# Patient Record
Sex: Male | Born: 1940
Health system: Southern US, Community
[De-identification: ages and names within clinical notes are randomized; demographics above are authoritative.]

## PROBLEM LIST (undated history)

## (undated) DIAGNOSIS — H9190 Unspecified hearing loss, unspecified ear: Secondary | ICD-10-CM

## (undated) DIAGNOSIS — T8859XA Other complications of anesthesia, initial encounter: Secondary | ICD-10-CM

## (undated) DIAGNOSIS — I10 Essential (primary) hypertension: Secondary | ICD-10-CM

## (undated) DIAGNOSIS — S143XXA Injury of brachial plexus, initial encounter: Secondary | ICD-10-CM

## (undated) DIAGNOSIS — I4891 Unspecified atrial fibrillation: Secondary | ICD-10-CM

## (undated) DIAGNOSIS — M75100 Unspecified rotator cuff tear or rupture of unspecified shoulder, not specified as traumatic: Secondary | ICD-10-CM

## (undated) DIAGNOSIS — G4730 Sleep apnea, unspecified: Secondary | ICD-10-CM

## (undated) DIAGNOSIS — C61 Malignant neoplasm of prostate: Secondary | ICD-10-CM

## (undated) HISTORY — DX: Essential (primary) hypertension: I10

## (undated) HISTORY — PX: KNEE SURGERY: SHX244

## (undated) HISTORY — PX: MOHS SURGERY: SUR867

## (undated) HISTORY — DX: Injury of brachial plexus, initial encounter: S14.3XXA

## (undated) HISTORY — DX: Unspecified rotator cuff tear or rupture of unspecified shoulder, not specified as traumatic: M75.100

## (undated) HISTORY — PX: TONSILLECTOMY: SUR1361

## (undated) HISTORY — DX: Unspecified atrial fibrillation: I48.91

## (undated) HISTORY — PX: OTHER SURGICAL HISTORY: SHX169

## (undated) HISTORY — PX: PROSTATECTOMY: SHX69

## (undated) HISTORY — DX: Malignant neoplasm of prostate: C61

---

## 1989-01-19 HISTORY — PX: OTHER SURGICAL HISTORY: SHX169

## 1993-04-21 DIAGNOSIS — S143XXA Injury of brachial plexus, initial encounter: Secondary | ICD-10-CM

## 1993-04-21 HISTORY — DX: Injury of brachial plexus, initial encounter: S14.3XXA

## 1997-04-21 DIAGNOSIS — C61 Malignant neoplasm of prostate: Secondary | ICD-10-CM

## 1997-04-21 HISTORY — DX: Malignant neoplasm of prostate: C61

## 1998-04-21 DIAGNOSIS — M75100 Unspecified rotator cuff tear or rupture of unspecified shoulder, not specified as traumatic: Secondary | ICD-10-CM

## 1998-04-21 HISTORY — DX: Unspecified rotator cuff tear or rupture of unspecified shoulder, not specified as traumatic: M75.100

## 2000-10-12 ENCOUNTER — Encounter: Admission: RE | Admit: 2000-10-12 | Discharge: 2000-10-12 | Payer: Self-pay | Admitting: Sports Medicine

## 2002-01-21 ENCOUNTER — Encounter: Admission: RE | Admit: 2002-01-21 | Discharge: 2002-01-21 | Payer: Self-pay | Admitting: Sports Medicine

## 2004-04-21 HISTORY — PX: OTHER SURGICAL HISTORY: SHX169

## 2005-03-06 ENCOUNTER — Ambulatory Visit: Payer: Self-pay | Admitting: Sports Medicine

## 2005-03-18 ENCOUNTER — Ambulatory Visit: Payer: Self-pay | Admitting: Family Medicine

## 2005-04-01 ENCOUNTER — Ambulatory Visit (HOSPITAL_COMMUNITY): Admission: RE | Admit: 2005-04-01 | Discharge: 2005-04-01 | Payer: Self-pay | Admitting: Sports Medicine

## 2005-04-01 ENCOUNTER — Ambulatory Visit: Payer: Self-pay | Admitting: Sports Medicine

## 2006-06-18 DIAGNOSIS — M1710 Unilateral primary osteoarthritis, unspecified knee: Secondary | ICD-10-CM | POA: Insufficient documentation

## 2006-06-18 DIAGNOSIS — C4490 Unspecified malignant neoplasm of skin, unspecified: Secondary | ICD-10-CM

## 2006-06-18 DIAGNOSIS — C61 Malignant neoplasm of prostate: Secondary | ICD-10-CM | POA: Insufficient documentation

## 2006-06-18 DIAGNOSIS — M479 Spondylosis, unspecified: Secondary | ICD-10-CM | POA: Insufficient documentation

## 2007-04-22 DIAGNOSIS — I499 Cardiac arrhythmia, unspecified: Secondary | ICD-10-CM

## 2007-04-22 HISTORY — DX: Cardiac arrhythmia, unspecified: I49.9

## 2007-04-29 ENCOUNTER — Ambulatory Visit: Payer: Self-pay | Admitting: Sports Medicine

## 2007-04-29 ENCOUNTER — Encounter: Admission: RE | Admit: 2007-04-29 | Discharge: 2007-04-29 | Payer: Self-pay | Admitting: Sports Medicine

## 2007-04-29 DIAGNOSIS — I10 Essential (primary) hypertension: Secondary | ICD-10-CM

## 2007-04-29 DIAGNOSIS — R05 Cough: Secondary | ICD-10-CM

## 2007-04-29 DIAGNOSIS — J309 Allergic rhinitis, unspecified: Secondary | ICD-10-CM | POA: Insufficient documentation

## 2007-04-29 DIAGNOSIS — R059 Cough, unspecified: Secondary | ICD-10-CM | POA: Insufficient documentation

## 2007-04-29 LAB — CONVERTED CEMR LAB
ALT: 20 units/L (ref 0–53)
AST: 20 units/L (ref 0–37)
Albumin: 4.2 g/dL (ref 3.5–5.2)
Alkaline Phosphatase: 67 units/L (ref 39–117)
BUN: 14 mg/dL (ref 6–23)
HDL: 75 mg/dL (ref >39)
Hemoglobin: 14.6 g/dL (ref 13.0–17.0)
LDL Cholesterol: 98 mg/dL (ref 0–99)
MCHC: 34.7 g/dL (ref 30.0–36.0)
PSA: 0.01 ng/mL — ABNORMAL LOW (ref 0.10–4.00)
Platelets: 201 10*3/uL (ref 150–400)
Potassium: 4.4 meq/L (ref 3.5–5.3)
RDW: 14.1 % (ref 11.5–15.5)
Sodium: 141 meq/L (ref 135–145)

## 2007-06-10 ENCOUNTER — Encounter: Payer: Self-pay | Admitting: *Deleted

## 2007-06-17 ENCOUNTER — Ambulatory Visit: Payer: Self-pay | Admitting: Sports Medicine

## 2007-06-17 DIAGNOSIS — D485 Neoplasm of uncertain behavior of skin: Secondary | ICD-10-CM

## 2008-01-26 ENCOUNTER — Ambulatory Visit: Payer: Self-pay | Admitting: Family Medicine

## 2008-01-28 ENCOUNTER — Encounter: Payer: Self-pay | Admitting: *Deleted

## 2008-08-10 ENCOUNTER — Ambulatory Visit: Payer: Self-pay | Admitting: Cardiology

## 2008-08-10 ENCOUNTER — Encounter: Payer: Self-pay | Admitting: Cardiology

## 2008-08-25 ENCOUNTER — Ambulatory Visit: Payer: Self-pay

## 2008-09-12 ENCOUNTER — Ambulatory Visit: Payer: Self-pay | Admitting: Internal Medicine

## 2008-09-14 ENCOUNTER — Telehealth: Payer: Self-pay | Admitting: Cardiology

## 2008-09-27 ENCOUNTER — Telehealth (INDEPENDENT_AMBULATORY_CARE_PROVIDER_SITE_OTHER): Payer: Self-pay | Admitting: *Deleted

## 2008-10-10 ENCOUNTER — Telehealth (INDEPENDENT_AMBULATORY_CARE_PROVIDER_SITE_OTHER): Payer: Self-pay | Admitting: *Deleted

## 2008-10-11 ENCOUNTER — Encounter: Payer: Self-pay | Admitting: Cardiology

## 2008-10-11 ENCOUNTER — Ambulatory Visit: Payer: Self-pay

## 2008-10-12 ENCOUNTER — Telehealth: Payer: Self-pay | Admitting: Cardiology

## 2010-05-23 ENCOUNTER — Encounter: Payer: Medicare Other | Admitting: Physician Assistant

## 2010-05-31 ENCOUNTER — Other Ambulatory Visit: Payer: Self-pay | Admitting: Orthopedic Surgery

## 2010-05-31 ENCOUNTER — Ambulatory Visit (HOSPITAL_COMMUNITY): Payer: Medicare Other | Attending: Cardiovascular Disease

## 2010-05-31 ENCOUNTER — Encounter: Payer: Self-pay | Admitting: Physician Assistant

## 2010-05-31 ENCOUNTER — Encounter (INDEPENDENT_AMBULATORY_CARE_PROVIDER_SITE_OTHER): Payer: Medicare Other | Admitting: Physician Assistant

## 2010-05-31 ENCOUNTER — Encounter (HOSPITAL_COMMUNITY): Payer: Medicare Other

## 2010-05-31 ENCOUNTER — Ambulatory Visit (HOSPITAL_COMMUNITY)
Admission: RE | Admit: 2010-05-31 | Discharge: 2010-05-31 | Disposition: A | Discharge status: Home / Self Care | Payer: Medicare Other | Source: Ambulatory Visit | Attending: Orthopedic Surgery | Admitting: Orthopedic Surgery

## 2010-05-31 ENCOUNTER — Other Ambulatory Visit (HOSPITAL_COMMUNITY): Payer: Self-pay | Admitting: Orthopedic Surgery

## 2010-05-31 DIAGNOSIS — R059 Cough, unspecified: Secondary | ICD-10-CM | POA: Insufficient documentation

## 2010-05-31 DIAGNOSIS — Z01818 Encounter for other preprocedural examination: Secondary | ICD-10-CM | POA: Insufficient documentation

## 2010-05-31 DIAGNOSIS — I10 Essential (primary) hypertension: Secondary | ICD-10-CM | POA: Insufficient documentation

## 2010-05-31 DIAGNOSIS — I080 Rheumatic disorders of both mitral and aortic valves: Secondary | ICD-10-CM | POA: Insufficient documentation

## 2010-05-31 DIAGNOSIS — I4891 Unspecified atrial fibrillation: Secondary | ICD-10-CM | POA: Insufficient documentation

## 2010-05-31 DIAGNOSIS — R05 Cough: Secondary | ICD-10-CM | POA: Insufficient documentation

## 2010-05-31 DIAGNOSIS — Z01810 Encounter for preprocedural cardiovascular examination: Secondary | ICD-10-CM

## 2010-05-31 DIAGNOSIS — IMO0002 Reserved for concepts with insufficient information to code with codable children: Secondary | ICD-10-CM | POA: Insufficient documentation

## 2010-05-31 DIAGNOSIS — I079 Rheumatic tricuspid valve disease, unspecified: Secondary | ICD-10-CM | POA: Insufficient documentation

## 2010-05-31 DIAGNOSIS — Z87891 Personal history of nicotine dependence: Secondary | ICD-10-CM | POA: Insufficient documentation

## 2010-05-31 DIAGNOSIS — M1710 Unilateral primary osteoarthritis, unspecified knee: Secondary | ICD-10-CM | POA: Insufficient documentation

## 2010-05-31 LAB — URINALYSIS, ROUTINE W REFLEX MICROSCOPIC
Nitrite: NEGATIVE
Specific Gravity, Urine: 1.019 (ref 1.005–1.030)
Urobilinogen, UA: 0.2 mg/dL (ref 0.0–1.0)

## 2010-05-31 LAB — BASIC METABOLIC PANEL
BUN: 18 mg/dL (ref 6–23)
Creatinine, Ser: 0.99 mg/dL (ref 0.4–1.5)
GFR calc non Af Amer: >60 mL/min (ref >60)

## 2010-05-31 LAB — PROTIME-INR
INR: 1.01 (ref 0.00–1.49)
Prothrombin Time: 13.5 seconds (ref 11.6–15.2)

## 2010-05-31 LAB — CBC
HCT: 44.9 % (ref 39.0–52.0)
Platelets: 207 10*3/uL (ref 150–400)
RDW: 13.6 % (ref 11.5–15.5)
WBC: 8.6 10*3/uL (ref 4.0–10.5)

## 2010-05-31 LAB — DIFFERENTIAL
Basophils Absolute: 0.1 10*3/uL (ref 0.0–0.1)
Eosinophils Absolute: 0.2 10*3/uL (ref 0.0–0.7)
Eosinophils Relative: 2 % (ref 0–5)
Lymphocytes Relative: 18 % (ref 12–46)

## 2010-06-03 ENCOUNTER — Ambulatory Visit (HOSPITAL_COMMUNITY): Admission: RE | Admit: 2010-06-03 | Payer: Medicare Other | Source: Ambulatory Visit

## 2010-06-04 ENCOUNTER — Telehealth (INDEPENDENT_AMBULATORY_CARE_PROVIDER_SITE_OTHER): Payer: Self-pay | Admitting: *Deleted

## 2010-06-04 ENCOUNTER — Telehealth: Payer: Self-pay | Admitting: Cardiovascular Disease

## 2010-06-06 NOTE — Assessment & Plan Note (Addendum)
Summary: Surgical Clearance   Visit Type:  surg clearance Primary Provider:  Lupe Carney  CC:  pt denies any cardiac complaints today..pt was @ [facility] today for pre-admisssion for surgery and Meriam Sprague from admissions called over to office and today and states EKG done shows AFIB and possible new onset.....  History of Present Illness: Glenn [name] is a 70 year old male Dr. Juanda Chance in 2010 for surgery.  He underwent a Myoview study at that time.  He walked for 10 minutes and his images demonstrated an ejection fraction of 63%, apical thinning but no scar or ischemia.  His medications for blood pressure were adjusted.  He is in need of surgical clearance and is referred today for such.  He was at [facility] today and apparently had an electrocardiogram that demonstrated possible new onset atrial fibrillation.  He needs a left total knee replacement which has been scheduled for 06/10/10 with Dr. Charlann Boxer.  Overall, he feels well.  He denies chest pain.  He denies syncope.  He denies orthopnea or PND.  He does occasionally have some mild ankle edema.  He exercises often and plays tennis 3 times a week.  He may occasionally notes some shortness of breath with over exertion but nothing out of the ordinary.  He has not felt any palpitations.  Current Medications (verified): 1)  Losartan Potassium 100 Mg Tabs (Losartan Potassium) .Marland Kitchen.. 1 Tab Once Daily 2)  Multivitamins  Tabs (Multiple Vitamin) .... Take 1 Tablet By Mouth Once A Day 3)  Glucosamine-Chondroitin 500-400 Mg Caps (Glucosamine-Chondroitin) .Marland Kitchen.. 1 Cap Once Daily 4)  Aspirin 81 Mg Tbec (Aspirin) .... Take One Tablet By Mouth Daily 5)  Ibuprofen 400 Mg Tabs (Ibuprofen) .Marland Kitchen.. 1 Tab Once Daily As Needed 6)  Amlodipine Besylate 5 Mg Tabs (Amlodipine Besylate) .... Take One Tablet By Mouth Daily  Allergies (verified): No Known Drug Allergies  Past History:  Past Medical History: brachial plexus injury lt arm 1995, torn rotator cuff rt arm  2000 Hypertension x 6 yrs Prostate Cancer  Past Surgical History: Int fixation left forearm - 01/19/1989, radical prostatectomy - 08/19/1993 - Dr Fr[facility]isca [facility] at [facility] removal of basal cell - 11/19/1996 ETT in 2006 with high fitness but BP of 255!  Family History: Reviewed history from 04/29/2007 and no changes required. brother 70 with obesity,  brother 27 with parkinson`s dz and prostate Ca,  father died 79 MI,  mother died at 13  Social History: works as Veterinary surgeon;  active Media planner played BB at [employer] graduated 1964 after 2 final fours married to [place]; 2 male and 1 male children;  multiple grandkids;  non smoker - quit 25 years ago social ETOH  Review of Systems       As per  the HPI.  All other systems reviewed and negative.   Vital Signs:  Patient profile:   70 year old male Height:      72 inches Weight:      209 pounds BMI:     28.45 Pulse rate:   62 / minute Pulse rhythm:   irregular BP sitting:   120 / 78  (left arm) Cuff size:   large  Vitals Entered By: Danielle Rankin, CMA (May 31, 2010 11:37 AM)  Physical Exam  General:  Well nourished, well developed, in no acute distress HEENT: normal Neck: no JVD Cardiac:  normal S1, S2; irreg irreg Lungs:  clear to auscultation bilaterally, no wheezing, rhonchi or rales Abd: soft, nontender, no hepatomegaly Ext: no edema Vascular: no carotid  bruits Skin: warm and dry Neuro:  CNs 2-12 intact, no focal abnormalities noted    EKG  Procedure date:  05/31/2010  Findings:      atrial fibrillation Heart rate  69 Normal axis Poor R-wave progression No ischemic changes  Impression & Recommendations:  Problem # 1:  ATRIAL FIBRILLATION (ICD-427.31) His rate is overall well controlled.  He actually has a slow response at times.  He denies any symptoms of near syncope, fatigue or syncope.  His stroke risk is probably significant enough to warrant long-term anticoagulation.  His CHADS2 score is one.  However, his  CHADS-VASc score is 2.  I discussed his case today with Dr. Excell Seltzer.  Dr. Excell Seltzer also saw the patient.  With his upcoming surgery, we felt that initiating long-term anticoagulation could be made postoperatively.  He will be placed on Coumadin in the short term after his knee surgery.  We will bring him back in close followup 2-3 weeks after his surgery to decide whether or not to continue Coumadin long-term.  I will set him up to see one of our electrophysiologists.  Before clearing him for surgery, we will set him up for an echocardiogram.  As long as this does not demonstrate any significant abnormalities, we will be able to clear him for surgery.  There is currently no need to start any rate controlling medications.  His rate should be monitored during his surgery to ensure that he does not have an uncontrolled response.  Our service will be available in the perioperative period as necessary.  Orders: Echocardiogram (Echo)  Problem # 2:  HYPERTENSION, BENIGN (ICD-401.1) Controlled.  Problem # 3:  PREOPERATIVE EXAMINATION (ICD-V72.84) As noted above, he will have an echocardiogram due to his new onset atrial fibrillation.  As long as this does not demonstrate any significant abnormalities, we should be able to clear him for his surgery.  Patient Instructions: 1)  Your physician recommends that you schedule a follow-up appointment in: 2-3 WEEKS 2)  Your physician has requested that you have an echocardiogram.  Echocardiography is a painless test that uses sound waves to create images of your heart. It provides your doctor with information about the size and shape of your heart and how well your heart's chambers and valves are working.  This procedure takes approximately one hour. There are no restrictions for this procedure.

## 2010-06-10 ENCOUNTER — Inpatient Hospital Stay (HOSPITAL_COMMUNITY)
Admission: RE | Admit: 2010-06-10 | Discharge: 2010-06-13 | DRG: 470 | Disposition: A | Discharge status: Home Health | Payer: Medicare Other | Attending: Orthopedic Surgery | Admitting: Orthopedic Surgery

## 2010-06-10 DIAGNOSIS — I10 Essential (primary) hypertension: Secondary | ICD-10-CM | POA: Diagnosis present

## 2010-06-10 DIAGNOSIS — M1710 Unilateral primary osteoarthritis, unspecified knee: Principal | ICD-10-CM | POA: Diagnosis present

## 2010-06-10 DIAGNOSIS — I4891 Unspecified atrial fibrillation: Secondary | ICD-10-CM | POA: Diagnosis present

## 2010-06-10 LAB — TYPE AND SCREEN: Antibody Screen: NEGATIVE

## 2010-06-11 LAB — CBC
HCT: 38.9 % — ABNORMAL LOW (ref 39.0–52.0)
MCV: 92.2 fL (ref 78.0–100.0)
Platelets: 191 10*3/uL (ref 150–400)
RBC: 4.22 MIL/uL (ref 4.22–5.81)
WBC: 12.8 10*3/uL — ABNORMAL HIGH (ref 4.0–10.5)

## 2010-06-11 LAB — BASIC METABOLIC PANEL
CO2: 26 mEq/L (ref 19–32)
Chloride: 108 mEq/L (ref 96–112)
Creatinine, Ser: 0.87 mg/dL (ref 0.4–1.5)
Potassium: 4.1 mEq/L (ref 3.5–5.1)

## 2010-06-12 ENCOUNTER — Encounter: Payer: Self-pay | Admitting: Cardiology

## 2010-06-12 LAB — CBC
HCT: 36.3 % — ABNORMAL LOW (ref 39.0–52.0)
Hemoglobin: 12.2 g/dL — ABNORMAL LOW (ref 13.0–17.0)
MCV: 92.4 fL (ref 78.0–100.0)
RDW: 13.6 % (ref 11.5–15.5)
WBC: 14.1 10*3/uL — ABNORMAL HIGH (ref 4.0–10.5)

## 2010-06-12 LAB — BASIC METABOLIC PANEL
BUN: 11 mg/dL (ref 6–23)
CO2: 23 mEq/L (ref 19–32)
Chloride: 109 mEq/L (ref 96–112)
Creatinine, Ser: 0.81 mg/dL (ref 0.4–1.5)
Glucose, Bld: 124 mg/dL — ABNORMAL HIGH (ref 70–99)

## 2010-06-12 NOTE — Progress Notes (Signed)
Summary: test results  Phone Note Call from Patient Call back at [phone]   Caller: Patient Reason for Call: Talk to Nurse Summary of Call: pt calling re echo results Initial call taken by: Roe Coombs,  June 04, 2010 12:48 PM  Follow-up for Phone Call        Pt. given echo results and Dr. Earmon Phoenix recommendations. Pt made aware he has been cleared for surgery and paperwork will be faxed to [facility] today. Follow-up by: Dossie Arbour, RN, BSN,  June 04, 2010 1:42 PM

## 2010-06-12 NOTE — Progress Notes (Signed)
Summary: Faxed records to [place] at [facility].  Faxed records to [place] at [facility]. LOV & ECHO Z:[phone] P:[phone] Marylou Mccoy  June 04, 2010 8:37 AM

## 2010-06-14 NOTE — Op Note (Signed)
NAME:  Glenn [name], Glenn [name]                     ACCOUNT NO.:  [id number]  MEDICAL RECORD NO.:  [id number]           PATIENT TYPE:  I  LOCATION:  1621                         FACILITY:  [facility]  PHYSICIAN:  Madlyn Frankel. Charlann Boxer, M.D.  DATE OF BIRTH:  May 02, 1940  DATE OF PROCEDURE:  06/10/2010 DATE OF DISCHARGE:                              OPERATIVE REPORT   PREOPERATIVE DIAGNOSIS:  Left knee osteoarthritis with flexion and varus deformity.  POSTOPERATIVE DIAGNOSIS:  Left knee osteoarthritis with flexion and varus deformity.  PROCEDURE:  Left total knee replacement utilizing DePuy component size 5 femur, 4  tibia, 41 patellar button, and 10 mm insert.  SURGEON:  Madlyn Frankel. Charlann Boxer, M.D.  ASSISTANT:  Nelia Shi. Webb Silversmith, RN.  ANESTHESIA:  Spinal.  SPECIMENS:  None.  COMPLICATIONS:  None.  DRAINS:  One Hemovac.  TOURNIQUET TIME:  46 minutes at 250 mmHg.  ESTIMATED BLOOD LOSS:  50 mL.  INDICATIONS FOR PROCEDURE:  Mr. Jaggers is a  70 year old male with advanced left knee osteoarthritis with progressive loss of function and pain that limiting his ability to continue doing the things he wish to do.  He does have moderate advanced right knee osteoarthritis.  After reviewing with him risks and benefits, the pros and cons of the procedure, the risks of infection, DVT, component failure, need for revision of surgery as well as the extensive need for therapy, postop work on its extension.  Consent was obtained for the benefit of pain relief.  PROCEDURE IN DETAIL:  The patient was brought to operative theater. Once adequate anesthesia, preoperative antibiotics, Ancef 2 g administered, patient was positioned supine with left leg in the Mayo leg holder.  A time-out was performed, identifying the patient, planned procedure, and the extremity.  Leg was exsanguinated, tourniquet elevated to 250 mmHg.  A midline incision was made followed by median arthrotomy.  Following initial exposure, synovectomy,  partial medial and lateral meniscectomies, attention was directed to patella precut measurement, it was 27 mm.  I resected down to about 15 mm and used the 41 patellar button to restore height.  Leg holes were drilled and a metal shim placed.  The metal shim was placed to protect the patella from retractors and saw blades.  At this point, more concentrated proximal and medial peel was carried out due to his varus deformity.  Attention was now directed to the femur.  Femoral canal was opened with a drill, irrigated to try to prevent fat emboli.  An intramedullary rod was passed at 3 degrees of valgus.  I resected 11 mm of bone of the distal femur based of his preoperative flexion contracture.  Following this resection the tibia subluxated anteriorly and using extramedullary guide I resected 10 mm bone off the proximal lateral tibia.  He was noted to have significant wear of the medial tibia.  At this point confirmed the extension gap was going to be stable with a 10-mm block and it was tight posteriorly.  I also confirmed the cut was perpendicular in the coronal plane for setting rotation of the femoral cut.  I then sized  the femur to be a size 5.  The size 5 rotation block was then pinned into position with a C clamp placed at the proximal tibial cut and then this was pinned into place for anterior reference.  The 4- in-1 cutting block was then placed.  Anterior, posterior, and chamfer cuts were then made without difficulty nor notching.  Final box cut made at the lateral aspect of the distal femur.  Tibia was then subluxated anteriorly.  I used the Harding rongeur and removed the osteophytes medially.  This reduced the cut surface of the proximal tibia back to an anatomic size.  I also removed osteophytes off the distal, medial, and lateral aspect of the femur as well as posterior osteophytes.  A size four tibial tray fit very nice on the cut surface of the tibia, it was pinned  into position, drilled, keel punched.  A trial reduction was now carried out with 5 femur, 4 tibia, and a 10-mm insert.  The knee came to full extension with stable medial and lateral collateral ligaments with little bit of pressure applied.  The patella tracked through the trochlea without application of pressure.  At this point, the trial components removed, I drilled holes into the sclerotic proximal medial tibia.  Cement was mixed.  The knee was injected with 0.25% Marcaine with epinephrine and 1 mL of Toradol and the knee irrigated with normal saline solution pulse lavage.  The final components were then cemented into position with the knee brought to extension with a 10-mm insert and extruded cement was removed.  Once the cement had fully cured, the knee was flexed up, the trial liner removed, excessive cement was removed throughout the knee.  The final 10-mm insert was then placed into the knee.  The tourniquet has been let down after 46 minutes without significant hemostasis required.  At this point, a medium Hemovac drain was placed deep.  The extensor mechanism was then reapproximated over top of this with #1 Vicryl with the knee in flexion.  The remaining wound was closed with 2-0 Vicryl and a running 4-0 Monocryl.  The knee was cleaned, dried, and dressed sterilely with Dermabond and Aquacel dressing.  The drain site dressed separately.  The knee wrapped in Ace.  He was then brought to recovery room in stable condition, tolerated the procedure well.     Madlyn Frankel Charlann Boxer, M.D.    MDO/MEDQ  D:  06/10/2010  T:  06/10/2010  Job:  [id number]  Electronically Signed by Durene Romans M.D. on 06/14/2010 07:04:27 AM

## 2010-06-18 NOTE — Discharge Summary (Signed)
  NAME:  Glenn [name], Glenn [name]                     ACCOUNT NO.:  [id number]  MEDICAL RECORD NO.:  [id number]           PATIENT TYPE:  I  LOCATION:  1621                         FACILITY:  [facility]  PHYSICIAN:  Russell L. Webb Silversmith, RN   DATE OF BIRTH:  December 16, 1940  DATE OF ADMISSION:  06/10/2010 DATE OF DISCHARGE:                              DISCHARGE SUMMARY   ADMITTING DIAGNOSIS:  Bilateral knee osteoarthritis, left greater than right.  BRIEF HISTORY:  This is a 70 year old male who is a friend of Dr. Nilsa Nutting family, was seen in consultation for knee pain.  He failed conservative treatment and decided to proceed with left knee arthroplasty first.  HOSPITAL COURSE:  The patient was admitted to Same Day Surgery on February 20.  He was taken to the operating theater and underwent left knee arthroplasty without any difficulty.  He was taken to PACU for recovery and brought to [facility] for further recovery and rehabilitation. Since that time, he has advanced his diet to regular.  He has been on physical therapy and done well with that.  His labs are stable.  His vital signs are stable.  He is afebrile.  He had some pain issues yesterday and decided to stay 1 more day.  He will be discharged home today with home health physical therapy.  DISCHARGE CONDITION:  Good.  DISCHARGE DIAGNOSES: 1. Bilateral knee osteoarthritis. 2. Hypertension. 3. Prostate cancer in 1995 with prostatectomy. 4. Shortness of breath, on exertion.  DISCHARGE INSTRUCTIONS:  Discharge instructions were given.  He knows he has to take the Aquacel dressing off in 8 days.  He can keep the wound dry otherwise, but he may shower.  He is to follow up with Dr. Charlann Boxer in 2 weeks.  DISCHARGE MEDICATIONS: 1. Acetaminophen 500 mg extra strength, 1000 every 8 hours. 2. Colace 100 mg as needed. 3. Ferrous sulfate 325 mg 3 times a day. 4. Robaxin 500 mg twice every 6 hours. 5. Oxycodone 5 mg to 15 mg every 4 to 6 hours as needed. 6. MiraLax 17  g a day as needed. 7. Xarelto 10 mg a day for 10 days. 8. Amlodipine 5 mg every morning. 9. Glucosamine daily. 10.Ibuprofen as needed. 11.Losartan 100 mg every morning. 12.Multivitamins daily.     Russell L. Webb Silversmith, RN     RLW/MEDQ  D:  06/13/2010  T:  06/13/2010  Job:  [id number]  Electronically Signed by Durene Romans M.D. on 06/18/2010 07:08:27 AM

## 2010-06-19 ENCOUNTER — Encounter: Payer: Self-pay | Admitting: Internal Medicine

## 2010-06-19 ENCOUNTER — Ambulatory Visit (INDEPENDENT_AMBULATORY_CARE_PROVIDER_SITE_OTHER): Payer: Medicare Other | Admitting: Internal Medicine

## 2010-06-19 DIAGNOSIS — I4891 Unspecified atrial fibrillation: Secondary | ICD-10-CM

## 2010-06-19 DIAGNOSIS — I10 Essential (primary) hypertension: Secondary | ICD-10-CM

## 2010-06-27 NOTE — Assessment & Plan Note (Signed)
Summary: 2-3 WK   Visit Type:  Follow-up Primary Provider:  Lupe Carney   History of Present Illness: Glenn [name] is referred today by Dr. Excell Seltzer for evaluation and treatment of atrial fibrillation. He has a h/o chronic arthritis and while he was undergoing workup for knee replacement was found to be in atrial fib. He denies palpitations. The patient states that he did not know he was out of rhythm but his wife thinks in retrospect he has had more dyspnea. He had no problems with his surgery (Dr. Charlann Boxer) and is about to start rehab. He denies c/p, syncope or peripheral edema. He had seen Dr. Juanda Chance over a year ago and had no ischemia on stress testing. No symptoms of hyperthyroidism.  Current Medications (verified): 1)  Losartan Potassium 100 Mg Tabs (Losartan Potassium) .Marland Kitchen.. 1 Tab Once Daily 2)  Amlodipine Besylate 5 Mg Tabs (Amlodipine Besylate) .... Take One Tablet By Mouth Daily 3)  Methocarbamol 500 Mg Tabs (Methocarbamol) .... Q 6 Hours As Needed 4)  Tylenol Extra Strength 500 Mg Tabs (Acetaminophen) .... As Directed 5)  Oxycodone Hcl 5 Mg Tabs (Oxycodone Hcl) .Marland Kitchen.. 1 Q 4-6 Hours As Needed 6)  Ferrous Sulfate 325 (65 Fe) Mg Tabs (Ferrous Sulfate) .Marland Kitchen.. 1 Three Times A Day  After Meals 7)  Senna-Plus 8.6-50 Mg Tabs (Sennosides-Docusate Sodium) .Marland Kitchen.. 1 Two Times A Day 8)  Colace 100 Mg Caps (Docusate Sodium) .Marland Kitchen.. 1 Two Times A Day 9)  Xarelto 10 Mg Tabs (Rivaroxaban) .Marland Kitchen.. 1 Once Daily  Allergies: No Known Drug Allergies  Past History:  Past Medical History: Last updated: 05/31/2010 brachial plexus injury lt arm 1995, torn rotator cuff rt arm 2000 Hypertension x 6 yrs Prostate Cancer  Past Surgical History: Last updated: 05/31/2010 Int fixation left forearm - 01/19/1989, radical prostatectomy - 08/19/1993 - Dr Fransisca Connors at [facility] removal of basal cell - 11/19/1996 ETT in 2006 with high fitness but BP of 255!  Family History: Last updated: 05/31/2010 brother 39 with obesity,  brother 23  with parkinson`s dz and prostate Ca,  father died 11 MI,  mother died at 93  Social History: Last updated: 05/31/2010 works as Veterinary surgeon;  active Media planner played BB at [employer] graduated 1964 after 2 final fours married to [place]; 2 male and 1 male children;  multiple grandkids;  non smoker - quit 25 years ago social ETOH  Review of Systems       All systems reviewed and negative except for left leg soreness.  Vital Signs:  Patient profile:   70 year old male Height:      72 inches Weight:      207 pounds BMI:     28.18 Pulse rate:   64 / minute BP sitting:   122 / 80  (left arm)  Vitals Entered By: Laurance Flatten CMA (June 19, 2010 3:31 PM)  Physical Exam  General:  Well nourished, well developed, in no acute distress HEENT: normal Neck: no JVD Cardiac:  normal S1, S2; irreg irreg Lungs:  clear to auscultation bilaterally, no wheezing, rhonchi or rales Abd: soft, nontender, no hepatomegaly Ext: no edema. Left leg minimally swollen. Vascular: no carotid  bruits Skin: warm and dry Neuro:  CNs 2-12 intact, no focal abnormalities noted    Impression & Recommendations:  Problem # 1:  ATRIAL FIBRILLATION (ICD-427.31) The patient is minimally symptomatic if at all. I discussed the issues of thromboembolic prevention and the CHADS scoring system with the patient (he is CHADS 1) as well as  prevention of symptoms and rate vs rhythm control. I plan to ask him to undergo exercise treadmill testing once he recovers from his knee replacement surgery to better assess his rate response. A decision about rate vs rhythm control will be discussed at that time. The following medications were removed from the medication list:    Aspirin 81 Mg Tbec (Aspirin) .Marland Kitchen... Take one tablet by mouth daily  Problem # 2:  HYPERTENSION, BENIGN (ICD-401.1) His blood pressure is well controlled.  Will follow. A low sodium diet is requested. The following medications were removed from the medication  list:    Aspirin 81 Mg Tbec (Aspirin) .Marland Kitchen... Take one tablet by mouth daily His updated medication list for this problem includes:    Losartan Potassium 100 Mg Tabs (Losartan potassium) .Marland Kitchen... 1 tab once daily    Amlodipine Besylate 5 Mg Tabs (Amlodipine besylate) .Marland Kitchen... Take one tablet by mouth daily  Patient Instructions: 1)  Your physician recommends that you schedule a follow-up appointment in: 2 MONTHS WITH DR Ladona Ridgel 2)  Your physician recommends that you continue on your current medications as directed. Please refer to the Current Medication list given to you today.

## 2010-07-09 NOTE — Letter (Signed)
Summary: [facility] Surgical Clearance   [facility] Surgical Clearance   Imported By: Roderic Ovens 07/01/2010 10:28:00  _____________________________________________________________________  External Attachment:    Type:   Image     Comment:   External Document

## 2010-07-12 NOTE — H&P (Signed)
  NAME:  [name], Glenn                     ACCOUNT NO.:  [id number]  MEDICAL RECORD NO.:  [id number]           PATIENT TYPE:  O  LOCATION:  PADM                         FACILITY:  [facility]  PHYSICIAN:  Madlyn Frankel. Charlann Boxer, M.D.  DATE OF BIRTH:  1940-08-06  DATE OF ADMISSION:  05/31/2010 DATE OF DISCHARGE:                             HISTORY & PHYSICAL   ADMISSION DIAGNOSIS:  Bilateral knee osteoarthritis, left greater than right.  HISTORY:  This is a 70 year old male who is a friend of Dr. Nilsa Nutting family and he is seen in consultation for his knee pain.  He has failed conservative treatments, decided to proceed with the arthroplasty, left first.  PAST MEDICAL HISTORY:  Significant for hypertension.  He did have prostate cancer in 1995 with prostatectomy.  He has occasional cough and shortness of breath on exertion, otherwise healthy.  PAST SURGICAL HISTORY:  Surgical history is only prostatectomy.  CURRENT MEDICATIONS:  Losartan, amlodipine, baby aspirin a day,vitamins, glucosamine chondroitin, ibuprofen.  ALLERGIES:  He does not describe medicine allergies.  He does not know the doses of his medicines.  SOCIAL HISTORY:  The patient is married.  He is real-estate agent.  He has past history of tobacco use.  He drinks alcohol beverages socially. No history of substance abuse.  He has 3 children.  His disposition plan is for home.  FAMILY HISTORY:  His father died at 93 of heart attack.  His mother died at 53 of old age.  He has siblings.  REVIEW OF SYSTEMS:  Notable for those difficulties described in history of present illness and past medical history.  His 14-point review of system sheet is otherwise unremarkable.  PHYSICAL EXAMINATION:  VITAL SIGNS:  The patient is 6 feet tall, 190 pounds, blood pressure is 120/80, his respirations are 20, his pulse is 68. GENERAL:  General health is good. HEENT:  Shows him to be normocephalic. NECK:  Unremarkable. CHEST:  Clear to auscultation  bilaterally. HEART:  S1, S2.  No murmurs, rubs or gallops. ABDOMEN:  Soft, nondistended. GI/GU:  Otherwise unremarkable except for the history of prostate in 1995. EXTREMITIES:  Extremity exam shows osteoarthritis of the knees. DERMATOLOGICAL:  Intact. NEUROLOGICAL:  Intact.  Labs, EKG and chest x-Llewellyn are pending through [facility].  IMPRESSION:  Bilateral knee osteoarthritis, left greater than right.  PLAN:  He will be admitted on the June 10, 2010, for a left total knee arthroplasty with Dr. Charlann Boxer.  His discharge medications including Xarelto, MiraLax, Colace, Robaxin and iron were given to him today.  His pain medicines will be given to him at discharge.     Russell L. Webb Silversmith, RN   ______________________________ Madlyn Frankel Charlann Boxer, M.D.    RLW/MEDQ  D:  05/31/2010  T:  05/31/2010  Job:  [id number]  Electronically Signed by Lauree Chandler NP-C on 06/03/2010 02:55:02 PM Electronically Signed by Durene Romans M.D. on 06/10/2010 10:49:18 AM

## 2010-07-19 ENCOUNTER — Telehealth: Payer: Self-pay | Admitting: Cardiovascular Disease

## 2010-07-19 MED ORDER — LOSARTAN POTASSIUM 100 MG PO TABS: 100.0000 mg | ORAL_TABLET | Freq: Every day | ORAL | Status: DC

## 2010-07-19 MED ORDER — AMLODIPINE BESYLATE 5 MG PO TABS: 5.0000 mg | ORAL_TABLET | Freq: Every day | ORAL | Status: DC

## 2010-07-19 NOTE — Telephone Encounter (Signed)
Pt needs his blood pressure meds to be refill. [facility] # [phone]

## 2010-08-09 ENCOUNTER — Encounter: Payer: Self-pay | Admitting: Internal Medicine

## 2010-08-09 ENCOUNTER — Encounter: Payer: Self-pay | Admitting: *Deleted

## 2010-08-12 ENCOUNTER — Ambulatory Visit (INDEPENDENT_AMBULATORY_CARE_PROVIDER_SITE_OTHER): Payer: Medicare Other | Admitting: Internal Medicine

## 2010-08-12 ENCOUNTER — Encounter: Payer: Self-pay | Admitting: Internal Medicine

## 2010-08-12 VITALS — BP 144/90 | HR 76 | Ht 72.0 in | Wt 198.0 lb

## 2010-08-12 DIAGNOSIS — I4891 Unspecified atrial fibrillation: Secondary | ICD-10-CM

## 2010-08-12 DIAGNOSIS — I10 Essential (primary) hypertension: Secondary | ICD-10-CM

## 2010-08-12 NOTE — Assessment & Plan Note (Signed)
Now the patient is more active, he may be experiencing some symptoms of atrial fibrillation. His anticoagulation has been discontinued. He is Chads score of one. I have recommended that he undergo cardiac monitoring. Depending on the results of this, I may ask him to undergo exercise treadmill testing. For now we'll continue a strategy of rate control. Because of high vagal tone he appears to not need any nodal blocking drugs at this time.

## 2010-08-12 NOTE — Assessment & Plan Note (Signed)
His blood pressure is slightly elevated today. We'll follow this and recommend additional medical therapy. It may well be that he will require additional rate control with AV nodal blocking drugs but also help control blood pressure.

## 2010-08-12 NOTE — Patient Instructions (Signed)
Your physician has recommended that you wear a holter monitor. Holter monitors are medical devices that record the heart's electrical activity. Doctors most often use these monitors to diagnose arrhythmias. Arrhythmias are problems with the speed or rhythm of the heartbeat. The monitor is a small, portable device. You can wear one while you do your normal daily activities. This is usually used to diagnose what is causing palpitations/syncope (passing out).  Your physician recommends that you schedule a follow-up appointment in: 6-8 weeks with Dr Ladona Ridgel.

## 2010-08-12 NOTE — Progress Notes (Signed)
HPI [name] returns today for followup. He is a pleasant middle-aged man with a history of hypertension who was found to have atrial fibrillation. He is recently undergone and knee surgery. He has done quite well with this. When I initially met him he was fairly immobile after his surgery and was very sedentary. He has increased his activity. With increased activity, he notes that his palpitations are more prevalent. He also thinks that his dyspnea is slightly increased compared to several months ago. He notes though that prior to his knee surgery, he was fairly sedentary. He denies chest pain or peripheral edema. No syncope. No Known Allergies   Current Outpatient Prescriptions  Medication Sig Dispense Refill  . amLODipine (NORVASC) 5 MG tablet Take 1 tablet (5 mg total) by mouth daily.  30 tablet  6  . aspirin 81 MG tablet Take 81 mg by mouth daily.        Marland Kitchen glucosamine-chondroitin 500-400 MG tablet Take 1 tablet by mouth daily.        Marland Kitchen losartan (COZAAR) 100 MG tablet Take 1 tablet (100 mg total) by mouth daily.  30 tablet  6  . Multiple Vitamin (MULTIVITAMIN) tablet Take 1 tablet by mouth daily.        Marland Kitchen DISCONTD: methocarbamol (ROBAXIN) 500 MG tablet Take 500 mg by mouth as needed.        Marland Kitchen DISCONTD: oxycodone (OXY-IR) 5 MG capsule Take 5 mg by mouth every 4 (four) hours as needed.        Marland Kitchen DISCONTD: rivaroxaban (XARELTO) 10 MG TABS tablet Take by mouth daily.           Past Medical History  Diagnosis Date  . HTN (hypertension)   . Prostate cancer   . Injury of brachial plexus   . Torn rotator cuff     ROS:   All systems reviewed and negative except as noted in the HPI.   Past Surgical History  Procedure Date  . Int fixation left forearm   . Prostatectomy   . Removal of basal cell      Family History  Problem Relation Age of Onset  . Prostate cancer    . Parkinsonism    . Obesity       History   Social History  . Marital Status: Married    Spouse Name: N/A   Number of Children: N/A  . Years of Education: N/A   Occupational History  . Not on file.   Social History Main Topics  . Smoking status: Former Games developer  . Smokeless tobacco: Not on file  . Alcohol Use: Not on file  . Drug Use: Not on file  . Sexually Active: Not on file   Other Topics Concern  . 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 after 2 final foursmarried to [place]; 2 male and 1 male children; multiple grandkids; non smoker - quit 25 years agosocial ETOH     BP 144/90  Pulse 76  Ht 6' (1.829 m)  Wt 198 lb (89.812 kg)  BMI 26.85 kg/m2  Physical Exam:  Well appearing NAD HEENT: Unremarkable Neck:  No JVD, no thyromegally Lymphatics:  No adenopathy Back:  No CVA tenderness Lungs:  Clear HEART:  Iregular rate rhythm, no murmurs, no rubs, no clicks Abd:  Flat, positive bowel sounds, no organomegally, no rebound, no guarding Ext:  2 plus pulses, no edema, no cyanosis, no  clubbing Skin:  No rashes no nodules Neuro:  CN II through XII intact, motor grossly intact  Assess/Plan:

## 2010-08-20 ENCOUNTER — Encounter (INDEPENDENT_AMBULATORY_CARE_PROVIDER_SITE_OTHER): Payer: Medicare Other

## 2010-08-20 DIAGNOSIS — I4891 Unspecified atrial fibrillation: Secondary | ICD-10-CM

## 2010-09-14 ENCOUNTER — Encounter: Payer: Self-pay | Admitting: Internal Medicine

## 2010-09-27 ENCOUNTER — Encounter: Payer: Self-pay | Admitting: Internal Medicine

## 2010-09-27 ENCOUNTER — Ambulatory Visit (INDEPENDENT_AMBULATORY_CARE_PROVIDER_SITE_OTHER): Payer: Medicare Other | Admitting: Internal Medicine

## 2010-09-27 DIAGNOSIS — I10 Essential (primary) hypertension: Secondary | ICD-10-CM

## 2010-09-27 DIAGNOSIS — I4891 Unspecified atrial fibrillation: Secondary | ICD-10-CM

## 2010-09-27 NOTE — Progress Notes (Signed)
HPI [name] returns today for followup. He is a very pleasant 70 year old man with a history of well-controlled hypertension and chronic atrial fibrillation. He has never had syncope. He denies chest pain or shortness of breath. I recently had him wear a cardiac monitor which demonstrated an average heart rate of 80 beats per minute. He did have nocturnal pauses of up to 3 seconds. He also had rapid ventricular rates up to 150 beats per minute. The patient remains quite active playing competitive tennis. He also exercises on a regular basis. No Known Allergies   Current Outpatient Prescriptions  Medication Sig Dispense Refill  . amLODipine (NORVASC) 5 MG tablet Take 1 tablet (5 mg total) by mouth daily.  30 tablet  6  . aspirin 81 MG tablet Take 81 mg by mouth daily.        Marland Kitchen glucosamine-chondroitin 500-400 MG tablet Take 1 tablet by mouth daily.        Marland Kitchen ibuprofen (ADVIL,MOTRIN) 200 MG tablet Take 200 mg by mouth every 6 (six) hours as needed.        Marland Kitchen losartan (COZAAR) 100 MG tablet Take 1 tablet (100 mg total) by mouth daily.  30 tablet  6  . Multiple Vitamin (MULTIVITAMIN) tablet Take 1 tablet by mouth daily.        Marland Kitchen DISCONTD: acetaminophen (TYLENOL) 500 MG tablet Take 500 mg by mouth as directed.        Marland Kitchen DISCONTD: docusate sodium (COLACE) 100 MG capsule Take 100 mg by mouth 2 (two) times daily.        Marland Kitchen DISCONTD: ferrous sulfate 325 (65 FE) MG tablet Take 325 mg by mouth 3 (three) times daily after meals.        Marland Kitchen DISCONTD: methocarbamol (ROBAXIN) 500 MG tablet Take 500 mg by mouth every 6 (six) hours as needed.        Marland Kitchen DISCONTD: oxyCODONE (OXY IR/ROXICODONE) 5 MG immediate release tablet Take 5 mg by mouth every 4 (four) hours as needed.        Marland Kitchen DISCONTD: rivaroxaban (XARELTO) 10 MG TABS tablet Take 10 mg by mouth daily.        Marland Kitchen DISCONTD: senna-docusate (SENOKOT-S) 8.6-50 MG per tablet Take 1 tablet by mouth 2 (two) times daily.           Past Medical History  Diagnosis Date  .  Prostate cancer   . Injury of brachial plexus   . Torn rotator cuff   . Brachial plexus injury 1995    Left arm  . Torn rotator cuff 2000    Right arm  . HTN (hypertension)     6 years  . Prostate cancer     ROS:   All systems reviewed and negative except as noted in the HPI.   Past Surgical History  Procedure Date  . Int fixation left forearm 01/19/1989  . Prostatectomy   . Removal of basal cell   . Ett 2006    High fitness but bp of 255     Family History  Problem Relation Age of Onset  . Heart attack Father     MI  . Obesity Brother   . Parkinsonism Brother   . Prostate cancer Brother      History   Social History  . Marital Status: Married    Spouse Name: N/A    Number of Children: N/A  . Years of Education: N/A   Occupational History  . Realtor    Social  History Main Topics  . Smoking status: Former Smoker    Quit date: 04/21/1985  . Smokeless tobacco: Not on file  . Alcohol Use: Yes     Social  . Drug Use: Not on file  . Sexually Active: Not on file   Other Topics Concern  . 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 after 2 final foursmarried to [place]; 2 male and 1 male children; multiple grandkids; non smoker - quit 25 years agosocial ETOH     BP 140/70  Pulse 83  Ht 6' (1.829 m)  Wt 200 lb (90.719 kg)  BMI 27.12 kg/m2  Physical Exam:  Well appearing NAD HEENT: Unremarkable Neck:  No JVD, no thyromegally Lymphatics:  No adenopathy Back:  No CVA tenderness Lungs:  Clear HEART:  IRegular rate rhythm, no murmurs, no rubs, no clicks Abd:  Flat, positive bowel sounds, no organomegally, no rebound, no guarding Ext:  2 plus pulses, no edema, no cyanosis, no clubbing Skin:  No rashes no nodules Neuro:  CN II through XII intact, motor grossly intact  EKG Atrial fibrillation with a ventricular rate of 83 beats per minute  Assess/Plan:

## 2010-09-27 NOTE — Assessment & Plan Note (Signed)
His blood pressure is well controlled. He will continue his current medicines maintain a low-sodium diet.

## 2010-09-27 NOTE — Patient Instructions (Signed)
Your physician recommends that you schedule a follow-up appointment in: 01/2011 with Dr Ladona Ridgel

## 2010-09-27 NOTE — Assessment & Plan Note (Signed)
Today we discussed the relative merits of rate versus rhythm control. He is basically asymptomatic in atrial fibrillation. His rate is well controlled. I recommended that he continue with his current medical therapy.

## 2010-10-22 NOTE — Progress Notes (Signed)
Addended by: Judithe Modest D on: 10/22/2010 02:12 PM   Modules accepted: Orders

## 2011-02-19 ENCOUNTER — Other Ambulatory Visit: Payer: Self-pay | Admitting: Internal Medicine

## 2011-04-07 ENCOUNTER — Other Ambulatory Visit: Payer: Self-pay | Admitting: Internal Medicine

## 2011-05-20 ENCOUNTER — Ambulatory Visit: Payer: Medicare Other | Admitting: Cardiovascular Disease

## 2011-05-20 ENCOUNTER — Encounter: Payer: Self-pay | Admitting: Cardiovascular Disease

## 2011-05-20 ENCOUNTER — Ambulatory Visit (INDEPENDENT_AMBULATORY_CARE_PROVIDER_SITE_OTHER): Payer: Medicare Other | Admitting: Cardiovascular Disease

## 2011-05-20 DIAGNOSIS — I10 Essential (primary) hypertension: Secondary | ICD-10-CM | POA: Diagnosis not present

## 2011-05-20 DIAGNOSIS — R0602 Shortness of breath: Secondary | ICD-10-CM

## 2011-05-20 DIAGNOSIS — I4891 Unspecified atrial fibrillation: Secondary | ICD-10-CM

## 2011-05-20 NOTE — Patient Instructions (Addendum)
Your physician has requested that you have an exercise tolerance test with Dr Excell Seltzer. For further information please visit [email or web address] Please also follow instruction sheet, as given.  Your physician recommends that you continue on your current medications as directed. Please refer to the Current Medication list given to you today.

## 2011-06-01 NOTE — Progress Notes (Signed)
HPI:  Glenn [name] returns for followup evaluation.  He is followed for atrial fibrillation. The patient has mild dyspnea with physical exertion, unchanged over the past year. He denies palpitations, chest pain or pressure, edema, lightheadedness, or syncope. He has been managed with the strategy of rate control. His chad score is one so he has not been anticoagulated. The patient continues to play tennis and remain physically active without significant exertional symptoms.  Outpatient Encounter Prescriptions as of 05/20/2011  Medication Sig Dispense Refill  . amLODipine (NORVASC) 5 MG tablet TAKE 1 TABLET BY MOUTH ONCE DAILY  30 tablet  7  . aspirin 81 MG tablet Take 81 mg by mouth daily.        . ibuprofen (ADVIL,MOTRIN) 200 MG tablet Take 200 mg by mouth every 6 (six) hours as needed.        . losartan (COZAAR) 100 MG tablet TAKE 1 TABLET BY MOUTH ONCE DAILY  30 tablet  2  . Multiple Vitamin (MULTIVITAMIN) tablet Take 1 tablet by mouth daily.        . DISCONTD: glucosamine-chondroitin 500-400 MG tablet Take 1 tablet by mouth daily.          No Known Allergies  Past Medical History  Diagnosis Date  . Prostate cancer   . Injury of brachial plexus   . Torn rotator cuff   . Brachial plexus injury 1995    Left arm  . Torn rotator cuff 2000    Right arm  . HTN (hypertension)     6 years  . Prostate cancer     ROS: Negative except as per HPI  BP 128/76  Pulse 86  Ht 6' (1.829 m)  Wt 93.35 kg (205 lb 12.8 oz)  BMI 27.91 kg/m2  PHYSICAL EXAM: Pt is alert and oriented, NAD HEENT: normal Neck: JVP - normal, carotids 2+= without bruits Lungs: CTA bilaterally CV: irregularly irregular without murmur or gallop Abd: soft, NT, Positive BS, no hepatomegaly Ext: no C/C/E, distal pulses intact and equal Skin: warm/dry no rash  EKG:  Atrial fibrillation 86 beats per minute, incomplete right bundle branch block.  ASSESSMENT AND PLAN:  

## 2011-06-01 NOTE — Assessment & Plan Note (Signed)
The patient appears stable. Recommend continue aspirin 81 mg daily in the setting of a low chads score.  We'll do an exercise treadmill study to assess rate control with exertion and to rule out an ischemic response.

## 2011-06-01 NOTE — Assessment & Plan Note (Signed)
Well-controlled on current regimen with Cozaar and amlodipine.

## 2011-06-04 ENCOUNTER — Ambulatory Visit (INDEPENDENT_AMBULATORY_CARE_PROVIDER_SITE_OTHER): Payer: Medicare Other | Admitting: Cardiovascular Disease

## 2011-06-04 DIAGNOSIS — I4891 Unspecified atrial fibrillation: Secondary | ICD-10-CM

## 2011-06-04 DIAGNOSIS — R0602 Shortness of breath: Secondary | ICD-10-CM | POA: Diagnosis not present

## 2011-06-04 NOTE — Progress Notes (Signed)
Exercise Treadmill Test  Pre-Exercise Testing Evaluation Rhythm: atrial fibrillation  Rate: 81   PR:  .16 QRS:  .10  QT:  39 QTc: 45     Test  Exercise Tolerance Test Ordering MD: Tonny Bollman, MD  Interpreting MD:  Tonny Bollman, MD  Unique Test No: 1  Treadmill:  1  Indication for ETT: A-FIB  Contraindication to ETT: No   Stress Modality: exercise - treadmill  Cardiac Imaging Performed: non   Protocol: standard Bruce - maximal  Max BP:  212/86  Max MPHR (bpm):  150 85% MPR (bpm):  127  MPHR obtained (bpm):  181 % MPHR obtained:  120%  Reached 85% MPHR (min:sec):  5:00 Total Exercise Time (min-sec):  9:00  Workload in METS:  10.0 Borg Scale: 13  Reason ETT Terminated:  desired heart rate attained    ST Segment Analysis At Rest: normal ST segments - no evidence of significant ST depression With Exercise: no evidence of significant ST depression  Other Information Arrhythmia:  Yes Angina during ETT:  absent (0) Quality of ETT:  diagnostic  ETT Interpretation:  normal - no evidence of ischemia by ST analysis  Comments: Good exercise tolerance. Baseline rhythm is atrial fib with elevated heart rate with exertion and normal heart rate recovery. There is no angina. No significant ST changes with exertion, but there are frequent aberrant beats with runs of up to 5-6 beats NSVT.  Recommendations: Will review with Dr Ladona Ridgel. Consider addition of beta-blocker.

## 2011-06-11 ENCOUNTER — Telehealth: Payer: Self-pay

## 2011-06-11 DIAGNOSIS — I4891 Unspecified atrial fibrillation: Secondary | ICD-10-CM

## 2011-06-11 DIAGNOSIS — R9439 Abnormal result of other cardiovascular function study: Secondary | ICD-10-CM

## 2011-06-11 DIAGNOSIS — R0602 Shortness of breath: Secondary | ICD-10-CM

## 2011-06-11 NOTE — Telephone Encounter (Signed)
I spoke with the pt about scheduling [facility] cardiac cath. The [facility] lab does not have availability to perform case on 06/13/11.  The next date that works for the pt is 06/25/11.  Cath scheduled and pt aware of instructions.  Copy of instructions also placed at the front desk for pick-up during 06/18/11 lab appointment.

## 2011-06-16 ENCOUNTER — Other Ambulatory Visit: Payer: Self-pay | Admitting: Cardiovascular Disease

## 2011-06-18 ENCOUNTER — Telehealth: Payer: Self-pay | Admitting: Cardiovascular Disease

## 2011-06-18 ENCOUNTER — Other Ambulatory Visit (INDEPENDENT_AMBULATORY_CARE_PROVIDER_SITE_OTHER): Payer: Medicare Other

## 2011-06-18 DIAGNOSIS — R9439 Abnormal result of other cardiovascular function study: Secondary | ICD-10-CM

## 2011-06-18 DIAGNOSIS — R0602 Shortness of breath: Secondary | ICD-10-CM

## 2011-06-18 DIAGNOSIS — I4891 Unspecified atrial fibrillation: Secondary | ICD-10-CM | POA: Diagnosis not present

## 2011-06-18 LAB — CBC WITH DIFFERENTIAL/PLATELET
Basophils Absolute: 0.1 10*3/uL (ref 0.0–0.1)
Eosinophils Absolute: 0.1 10*3/uL (ref 0.0–0.7)
Hemoglobin: 15 g/dL (ref 13.0–17.0)
Lymphocytes Relative: 20.2 % (ref 12.0–46.0)
MCHC: 33.4 g/dL (ref 30.0–36.0)
Monocytes Relative: 6.6 % (ref 3.0–12.0)
Neutro Abs: 5.5 10*3/uL (ref 1.4–7.7)
Neutrophils Relative %: 70.8 % (ref 43.0–77.0)
RDW: 14.7 % — ABNORMAL HIGH (ref 11.5–14.6)

## 2011-06-18 LAB — BASIC METABOLIC PANEL
Calcium: 9 mg/dL (ref 8.4–10.5)
GFR: 79.35 mL/min (ref >60.00)
Potassium: 4.1 mEq/L (ref 3.5–5.1)
Sodium: 139 mEq/L (ref 135–145)

## 2011-06-18 LAB — PROTIME-INR
INR: 1 ratio (ref 0.8–1.0)
Prothrombin Time: 10.8 s (ref 10.2–12.4)

## 2011-06-18 NOTE — Telephone Encounter (Signed)
According to the cath letter, pt is to have labs done today, 2/27.  Pt states he thought it was today and will be in around 1:00pm.  He was reminded to pick up his pre-cath letter at the front desk when he comes in.

## 2011-06-18 NOTE — Telephone Encounter (Signed)
New Problem   Please return call to patient to advise when he is suppose to have labs for 06/25/11 cath.  At this point both lab and cath are sched on the same day 3/6.  Patient is going out of town tomorrow, please call cell # [phone] ASAP .

## 2011-06-25 ENCOUNTER — Other Ambulatory Visit: Payer: Medicare Other

## 2011-06-25 ENCOUNTER — Encounter (HOSPITAL_BASED_OUTPATIENT_CLINIC_OR_DEPARTMENT_OTHER)
Admission: RE | Disposition: A | Discharge status: Home / Self Care | Payer: Self-pay | Source: Ambulatory Visit | Attending: Cardiovascular Disease

## 2011-06-25 ENCOUNTER — Inpatient Hospital Stay (HOSPITAL_BASED_OUTPATIENT_CLINIC_OR_DEPARTMENT_OTHER)
Admission: RE | Admit: 2011-06-25 | Discharge: 2011-06-25 | Disposition: A | Discharge status: Home / Self Care | Payer: Medicare Other | Source: Ambulatory Visit | Attending: Cardiovascular Disease | Admitting: Cardiovascular Disease

## 2011-06-25 DIAGNOSIS — I472 Ventricular tachycardia, unspecified: Secondary | ICD-10-CM | POA: Insufficient documentation

## 2011-06-25 DIAGNOSIS — I4729 Other ventricular tachycardia: Secondary | ICD-10-CM | POA: Insufficient documentation

## 2011-06-25 DIAGNOSIS — R9430 Abnormal result of cardiovascular function study, unspecified: Secondary | ICD-10-CM

## 2011-06-25 SURGERY — JV LEFT HEART CATHETERIZATION WITH CORONARY ANGIOGRAM
Anesthesia: Moderate Sedation

## 2011-06-25 MED ORDER — ASPIRIN 81 MG PO CHEW
324.0000 mg | CHEWABLE_TABLET | ORAL | Status: AC
  Administered 2011-06-25: 324 mg via ORAL

## 2011-06-25 MED ORDER — SODIUM CHLORIDE 0.9 % IV SOLN: INTRAVENOUS | Status: DC

## 2011-06-25 MED ORDER — SODIUM CHLORIDE 0.9 % IV SOLN: 250.0000 mL | INTRAVENOUS | Status: DC | PRN

## 2011-06-25 MED ORDER — SODIUM CHLORIDE 0.9 % IJ SOLN: 3.0000 mL | INTRAMUSCULAR | Status: DC | PRN

## 2011-06-25 MED ORDER — DIAZEPAM 5 MG PO TABS
5.0000 mg | ORAL_TABLET | ORAL | Status: AC
  Administered 2011-06-25: 5 mg via ORAL

## 2011-06-25 MED ORDER — SODIUM CHLORIDE 0.9 % IJ SOLN: 3.0000 mL | Freq: Two times a day (BID) | INTRAMUSCULAR | Status: DC

## 2011-06-25 NOTE — Op Note (Signed)
   Cardiac Catheterization Procedure Note  Name: Glenn [name] MRN: [id number] DOB: 08/22/1940  Procedure: Left Heart Cath, Selective Coronary Angiography, LV angiography  Indication: 71 year old gentleman who had nonsustained VT during exercise treadmill testing. Diagnostic cath was recommended to rule out obstructive CAD/myocardial ischemia as a cause of nonsustained ventricular tachycardia.   Procedural Details: The right wrist was prepped, draped, and anesthetized with 1% lidocaine. Using the modified Seldinger technique, a 5 French sheath was introduced into the right radial artery. 3 mg of verapamil was administered through the sheath, weight-based unfractionated heparin was administered intravenously. Standard Judkins catheters were used for selective coronary angiography and left ventriculography. Catheter exchanges were performed over an exchange length guidewire. There were no immediate procedural complications. A TR band was used for radial hemostasis at the completion of the procedure.  The patient was transferred to the post catheterization recovery area for further monitoring.  Procedural Findings: Hemodynamics: AO 118/64 LV 118/15  Coronary angiography: Coronary dominance: right  Left mainstem: Widely patent with no obstructive disease.  Left anterior descending (LAD): The vessel is widely patent. There is calcification in the proximal LAD but there is no associated stenosis. The vessel gives off 2 diagonal branches without significant disease. The vessel courses to the apex.  Left circumflex (LCx): Moderate caliber vessel with no obstructive disease.  Right coronary artery (RCA): Dominant vessel, supplies the PDA and a posterolateral branch. There is no obstructive disease present.  Left ventriculography: Left ventricular systolic function is normal, LVEF is estimated at 55-65%, there is no significant mitral regurgitation   Final Conclusions:   1. No significant coronary  artery disease 2. Normal left ventricular systolic function  Tonny Bollman 06/25/2011, 6:55 PM

## 2011-06-25 NOTE — OR Nursing (Signed)
Meal served 

## 2011-06-25 NOTE — H&P (View-Only) (Signed)
HPI:  Glenn [name] returns for followup evaluation.  He is followed for atrial fibrillation. The patient has mild dyspnea with physical exertion, unchanged over the past year. He denies palpitations, chest pain or pressure, edema, lightheadedness, or syncope. He has been managed with the strategy of rate control. His [place] score is one so he has not been anticoagulated. The patient continues to play tennis and remain physically active without significant exertional symptoms.  Outpatient Encounter Prescriptions as of 05/20/2011  Medication Sig Dispense Refill  . amLODipine (NORVASC) 5 MG tablet TAKE 1 TABLET BY MOUTH ONCE DAILY  30 tablet  7  . aspirin 81 MG tablet Take 81 mg by mouth daily.        Marland Kitchen ibuprofen (ADVIL,MOTRIN) 200 MG tablet Take 200 mg by mouth every 6 (six) hours as needed.        Marland Kitchen losartan (COZAAR) 100 MG tablet TAKE 1 TABLET BY MOUTH ONCE DAILY  30 tablet  2  . Multiple Vitamin (MULTIVITAMIN) tablet Take 1 tablet by mouth daily.        Marland Kitchen DISCONTD: glucosamine-chondroitin 500-400 MG tablet Take 1 tablet by mouth daily.          No Known Allergies  Past Medical History  Diagnosis Date  . Prostate cancer   . Injury of brachial plexus   . Torn rotator cuff   . Brachial plexus injury 1995    Left arm  . Torn rotator cuff 2000    Right arm  . HTN (hypertension)     6 years  . Prostate cancer     ROS: Negative except as per HPI  BP 128/76  Pulse 86  Ht 6' (1.829 m)  Wt 93.35 kg (205 lb 12.8 oz)  BMI 27.91 kg/m2  PHYSICAL EXAM: Pt is alert and oriented, NAD HEENT: normal Neck: JVP - normal, carotids 2+= without bruits Lungs: CTA bilaterally CV: irregularly irregular without murmur or gallop Abd: soft, NT, Positive BS, no hepatomegaly Ext: no C/C/E, distal pulses intact and equal Skin: warm/dry no rash  EKG:  Atrial fibrillation 86 beats per minute, incomplete right bundle branch block.  ASSESSMENT AND PLAN:

## 2011-06-25 NOTE — Interval H&P Note (Signed)
History and Physical Interval Note:  06/25/2011 10:05 AM  Glenn [name]  has presented today for surgery, with the diagnosis of abnormal treadmill  The various methods of treatment have been discussed with the patient and family. After consideration of risks, benefits and other options for treatment, the patient has consented to  Procedure(s) (LRB): [facility] LEFT HEART CATHETERIZATION WITH CORONARY ANGIOGRAM (N/A) as a surgical intervention .  The patients' history has been reviewed, patient examined, no change in status, stable for surgery.  I have reviewed the patients' chart and labs.  Questions were answered to the patient's satisfaction.  Since the patient's office visit, he underwent treadmill testing. He had nonsustained ventricular tachycardia on the treadmill. He was asymptomatic, but does raise concern for underlying ischemia. We recommended cardiac catheterization and possible PCI. The patient understands the indication, risks, and alternatives. He agrees to proceed.   Tonny Bollman 06/25/2011 10:06 AM

## 2011-06-25 NOTE — OR Nursing (Signed)
Discharge instructions reviewed and signed, pt stated understanding, ambulated in hall without difficulty, site level 0, transported to wife's car via wheelchair 

## 2011-06-26 DIAGNOSIS — M1710 Unilateral primary osteoarthritis, unspecified knee: Secondary | ICD-10-CM | POA: Diagnosis not present

## 2011-07-09 ENCOUNTER — Other Ambulatory Visit: Payer: Self-pay | Admitting: Internal Medicine

## 2011-07-09 ENCOUNTER — Other Ambulatory Visit: Payer: Self-pay

## 2011-07-09 ENCOUNTER — Ambulatory Visit: Payer: Medicare Other | Admitting: Physician Assistant

## 2011-07-09 MED ORDER — LOSARTAN POTASSIUM 100 MG PO TABS: 100.0000 mg | ORAL_TABLET | Freq: Every day | ORAL | Status: DC

## 2011-07-10 ENCOUNTER — Encounter: Payer: Medicare Other | Admitting: Cardiovascular Disease

## 2011-07-25 DIAGNOSIS — Z125 Encounter for screening for malignant neoplasm of prostate: Secondary | ICD-10-CM | POA: Diagnosis not present

## 2011-07-25 DIAGNOSIS — I10 Essential (primary) hypertension: Secondary | ICD-10-CM | POA: Diagnosis not present

## 2011-07-25 DIAGNOSIS — I4891 Unspecified atrial fibrillation: Secondary | ICD-10-CM | POA: Diagnosis not present

## 2011-07-25 DIAGNOSIS — M1710 Unilateral primary osteoarthritis, unspecified knee: Secondary | ICD-10-CM | POA: Diagnosis not present

## 2011-09-28 DIAGNOSIS — S63509A Unspecified sprain of unspecified wrist, initial encounter: Secondary | ICD-10-CM | POA: Diagnosis not present

## 2011-10-14 ENCOUNTER — Ambulatory Visit: Payer: Medicare Other | Admitting: Internal Medicine

## 2011-10-17 ENCOUNTER — Other Ambulatory Visit: Payer: Self-pay | Admitting: Internal Medicine

## 2011-12-16 ENCOUNTER — Encounter: Payer: Self-pay | Admitting: Internal Medicine

## 2011-12-16 ENCOUNTER — Ambulatory Visit (INDEPENDENT_AMBULATORY_CARE_PROVIDER_SITE_OTHER): Payer: Medicare Other | Admitting: Internal Medicine

## 2011-12-16 VITALS — BP 128/86 | HR 70 | Ht 72.0 in | Wt 195.0 lb

## 2011-12-16 DIAGNOSIS — I10 Essential (primary) hypertension: Secondary | ICD-10-CM | POA: Diagnosis not present

## 2011-12-16 DIAGNOSIS — I4891 Unspecified atrial fibrillation: Secondary | ICD-10-CM | POA: Diagnosis not present

## 2011-12-16 NOTE — Assessment & Plan Note (Signed)
His symptoms are well-controlled. I continue to recommend medical therapy with rate control. He currently has only one risk factor for stroke. At age 71, I would anticipate initiation of systemic anticoagulation.

## 2011-12-16 NOTE — Progress Notes (Signed)
HPI Glenn [name] returns today for followup. He is a very pleasant 71 year old man with a history of atrial fibrillation. He is otherwise been healthy. He has minimal hypertension controlled on 5 mg of amlodipine and losartan. The patient denies chest pain or shortness of breath. He remains active playing tennis on a regular basis. He has been with his family most of the summer has had no symptoms. He denies palpitations. No Known Allergies   Current Outpatient Prescriptions  Medication Sig Dispense Refill  . amLODipine (NORVASC) 5 MG tablet TAKE 1 TABLET BY MOUTH ONCE DAILY  30 tablet  7  . amoxicillin (AMOXIL) 500 MG capsule Take 4 tabs 1 hour prior to dental procedure      . aspirin 81 MG tablet Take 81 mg by mouth daily.        Marland Kitchen ibuprofen (ADVIL,MOTRIN) 200 MG tablet Take 200 mg by mouth every 6 (six) hours as needed.        Marland Kitchen losartan (COZAAR) 100 MG tablet Take 1 tablet (100 mg total) by mouth daily.  30 tablet  6     Past Medical History  Diagnosis Date  . Prostate cancer   . Injury of brachial plexus   . Torn rotator cuff   . Brachial plexus injury 1995    Left arm  . Torn rotator cuff 2000    Right arm  . HTN (hypertension)     6 years  . Prostate cancer     ROS:   All systems reviewed and negative except as noted in the HPI.   Past Surgical History  Procedure Date  . Int fixation left forearm 01/19/1989  . Prostatectomy   . Removal of basal cell   . Ett 2006    High fitness but bp of 255     Family History  Problem Relation Age of Onset  . Heart attack Father     MI  . Obesity Brother   . Parkinsonism Brother   . Prostate cancer Brother      History   Social History  . Marital Status: Married    Spouse Name: N/A    Number of Children: N/A  . Years of Education: N/A   Occupational History  . Realtor    Social History Main Topics  . Smoking status: Former Smoker    Quit date: 04/21/1985  . Smokeless tobacco: Not on file  . Alcohol Use: Yes   Social  . Drug Use: Not on file  . Sexually Active: Not on file   Other Topics Concern  . 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 HEART:  Regular rate rhythm, no murmurs, no rubs, no clicks Abd:  soft, positive bowel sounds, no organomegally, no rebound, no guarding Ext:  2 plus pulses, no edema, no cyanosis, no clubbing Skin:  No rashes no nodules Neuro:  CN II through XII intact, motor grossly intact  EKG Atrial fibrillation with a controlled ventricular response  Assess/Plan:

## 2011-12-16 NOTE — Assessment & Plan Note (Signed)
His hypertension is well controlled. He will continue a low-sodium diet. He is lost approximately 5 or 10 pounds by cutting back on his calorie intake.

## 2011-12-16 NOTE — Patient Instructions (Addendum)
Your physician wants you to follow-up in: 12 months with Dr. Taylor. You will receive a reminder letter in the mail two months in advance. If you don't receive a letter, please call our office to schedule the follow-up appointment.    

## 2012-01-29 DIAGNOSIS — Z23 Encounter for immunization: Secondary | ICD-10-CM | POA: Diagnosis not present

## 2012-01-29 DIAGNOSIS — I10 Essential (primary) hypertension: Secondary | ICD-10-CM | POA: Diagnosis not present

## 2012-01-29 DIAGNOSIS — Z1211 Encounter for screening for malignant neoplasm of colon: Secondary | ICD-10-CM | POA: Diagnosis not present

## 2012-01-29 DIAGNOSIS — I4891 Unspecified atrial fibrillation: Secondary | ICD-10-CM | POA: Diagnosis not present

## 2012-01-29 DIAGNOSIS — Z79899 Other long term (current) drug therapy: Secondary | ICD-10-CM | POA: Diagnosis not present

## 2012-01-29 DIAGNOSIS — M1710 Unilateral primary osteoarthritis, unspecified knee: Secondary | ICD-10-CM | POA: Diagnosis not present

## 2012-02-05 ENCOUNTER — Encounter: Payer: Self-pay | Admitting: Internal Medicine

## 2012-02-09 ENCOUNTER — Other Ambulatory Visit: Payer: Self-pay | Admitting: Internal Medicine

## 2012-03-23 ENCOUNTER — Ambulatory Visit (AMBULATORY_SURGERY_CENTER): Payer: Medicare Other | Admitting: *Deleted

## 2012-03-23 VITALS — Ht 71.0 in | Wt 197.6 lb

## 2012-03-23 DIAGNOSIS — Z1211 Encounter for screening for malignant neoplasm of colon: Secondary | ICD-10-CM

## 2012-03-23 MED ORDER — PEG-KCL-NACL-NASULF-NA ASC-C 100 G PO SOLR: ORAL | Status: DC

## 2012-03-23 NOTE — Progress Notes (Signed)
No allergies to eggs or soy products  Corrected the time he is be NPO before procedure- he knows to not drink after 7:00 a.m.

## 2012-04-06 ENCOUNTER — Ambulatory Visit (AMBULATORY_SURGERY_CENTER): Payer: Medicare Other | Admitting: Internal Medicine

## 2012-04-06 ENCOUNTER — Encounter: Payer: Self-pay | Admitting: Internal Medicine

## 2012-04-06 VITALS — BP 133/75 | HR 68 | Temp 97.6°F | Resp 12 | Ht 71.0 in | Wt 197.0 lb

## 2012-04-06 DIAGNOSIS — Z1211 Encounter for screening for malignant neoplasm of colon: Secondary | ICD-10-CM

## 2012-04-06 DIAGNOSIS — D126 Benign neoplasm of colon, unspecified: Secondary | ICD-10-CM

## 2012-04-06 MED ORDER — SODIUM CHLORIDE 0.9 % IV SOLN: 500.0000 mL | INTRAVENOUS | Status: DC

## 2012-04-06 NOTE — Op Note (Signed)
[facility] [address] [place], [address]   COLONOSCOPY PROCEDURE REPORT  PATIENT: Glenn, [name]  MR#: [id number] BIRTHDATE: 30-Aug-1940 , 71  yrs. old GENDER: Male ENDOSCOPIST: Roxy Cedar, MD REFERRED XB:JYNW Mitchell, M.D. PROCEDURE DATE:  04/06/2012 PROCEDURE:   Colonoscopy with snare polypectomy   x 5 ASA CLASS:   Class II INDICATIONS:average risk screening. MEDICATIONS: MAC sedation, administered by CRNA and propofol (Diprivan) 450mg  IV  DESCRIPTION OF PROCEDURE:   After the risks benefits and alternatives of the procedure were thoroughly explained, informed consent was obtained.  A digital rectal exam revealed no abnormalities of the rectum.   The LB [id number]  endoscope was introduced through the anus and advanced to the cecum, which was identified by both the appendix and ileocecal valve. No adverse events experienced.   The quality of the prep was excellent, using MoviPrep  The instrument was then slowly withdrawn as the colon was fully examined.      COLON FINDINGS: Five polyps were found at the cecum (26mm,8mm), in the ascending colon (5mm, 15mm), and transverse colon (4mm).  A polypectomy was performed with a cold snare in the smaller four and hot snare for the largest.  The resection was complete and the polyp tissue was completely retrieved.   Moderate diverticulosis was noted in the right colon and left colon.   The colon mucosa was otherwise normal.  Retroflexed views revealed internal hemorrhoids. The time to cecum=3 minutes 09 seconds.  Withdrawal time=25 minutes 36 seconds.  The scope was withdrawn and the procedure completed. COMPLICATIONS: There were no complications.  ENDOSCOPIC IMPRESSION: 1.   Five polyps were found at the cecum, ascending colon, and transverse colon; polypectomy was performed with a cold and hot snare 2.   Moderate diverticulosis was noted in the right colon and left colon 3.   The colon mucosa was  otherwise normal  RECOMMENDATIONS: 1. No ASA or NSAIDS x 2 weeks 2. Repeat Colonoscopy in 3 years.   eSigned:  Roxy Cedar, MD 04/06/2012 11:26 AM cc: Lupe Carney, MD and The Patient   PATIENT NAME:  Glenn, [name] MR#: [id number]

## 2012-04-06 NOTE — Progress Notes (Addendum)
Called to room to assist during endoscopic procedure.  Patient ID and intended procedure confirmed with present staff. Received instructions for my participation in the procedure from the performing physician.   I had to stay in the room due to multiple polyps.

## 2012-04-06 NOTE — Progress Notes (Signed)
Patient did not experience any of the following events: a burn prior to discharge; a fall within the facility; wrong site/side/patient/procedure/implant event; or a hospital transfer or hospital admission upon discharge from the facility. (G8907) Patient did not have preoperative order for IV antibiotic SSI prophylaxis. (G8918)  

## 2012-04-06 NOTE — Patient Instructions (Addendum)
5 polyps found and removed.  Sent to pathology.   Moderate diverticulosis. No Aspirin or NSAIDS for 2 weeks   YOU HAD AN ENDOSCOPIC PROCEDURE TODAY AT THE [facility]: Refer to the procedure report that was given to you for any specific questions about what was found during the examination.  If the procedure report does not answer your questions, please call your gastroenterologist to clarify.  If you requested that your care partner not be given the details of your procedure findings, then the procedure report has been included in a sealed envelope for you to review at your convenience later.  YOU SHOULD EXPECT: Some feelings of bloating in the abdomen. Passage of more gas than usual.  Walking can help get rid of the air that was put into your GI tract during the procedure and reduce the bloating. If you had a lower endoscopy (such as a colonoscopy or flexible sigmoidoscopy) you may notice spotting of blood in your stool or on the toilet paper. If you underwent a bowel prep for your procedure, then you may not have a normal bowel movement for a few days.  DIET: Your first meal following the procedure should be a light meal and then it is ok to progress to your normal diet.  A half-sandwich or bowl of soup is an example of a good first meal.  Heavy or fried foods are harder to digest and may make you feel nauseous or bloated.  Likewise meals heavy in dairy and vegetables can cause extra gas to form and this can also increase the bloating.  Drink plenty of fluids but you should avoid alcoholic beverages for 24 hours.  ACTIVITY: Your care partner should take you home directly after the procedure.  You should plan to take it easy, moving slowly for the rest of the day.  You can resume normal activity the day after the procedure however you should NOT DRIVE or use heavy machinery for 24 hours (because of the sedation medicines used during the test).    SYMPTOMS TO REPORT IMMEDIATELY: A  gastroenterologist can be reached at any hour.  During normal business hours, 8:30 AM to 5:00 PM Monday through Friday, call [phone].  After hours and on weekends, please call the GI answering service at [phone] who will take a message and have the physician on call contact you.   Following lower endoscopy (colonoscopy or flexible sigmoidoscopy):  Excessive amounts of blood in the stool  Significant tenderness or worsening of abdominal pains  Swelling of the abdomen that is new, acute  Fever of 100F or higher   FOLLOW UP: If any biopsies were taken you will be contacted by phone or by letter within the next 1-3 weeks.  Call your gastroenterologist if you have not heard about the biopsies in 3 weeks.  Our staff will call the home number listed on your records the next business day following your procedure to check on you and address any questions or concerns that you may have at that time regarding the information given to you following your procedure. This is a courtesy call and so if there is no answer at the home number and we have not heard from you through the emergency physician on call, we will assume that you have returned to your regular daily activities without incident.  SIGNATURES/CONFIDENTIALITY: You and/or your care partner have signed paperwork which will be entered into your electronic medical record.  These signatures attest to the fact that  that the information above on your After Visit Summary has been reviewed and is understood.  Full responsibility of the confidentiality of this discharge information lies with you and/or your care-partner.

## 2012-04-07 ENCOUNTER — Telehealth: Payer: Self-pay

## 2012-04-07 NOTE — Telephone Encounter (Signed)
  Follow up Call-  Call back number 04/06/2012  Post procedure Call Back phone  # [phone]  Permission to leave phone message Yes     Patient questions:  Do you have a fever, pain , or abdominal swelling? yes Pain Score  0 * Had "slight fever last pm but it went away with Tylenol".  Advised to call again if persists. Have you tolerated food without any problems? yes  Have you been able to return to your normal activities? yes  Do you have any questions about your discharge instructions: Diet   no Medications  no Follow up visit  no  Do you have questions or concerns about your Care? no  Actions: * If pain score is 4 or above: No action needed, pain <4.

## 2012-04-12 ENCOUNTER — Encounter: Payer: Self-pay | Admitting: Internal Medicine

## 2012-06-11 ENCOUNTER — Other Ambulatory Visit: Payer: Self-pay | Admitting: Internal Medicine

## 2012-07-29 DIAGNOSIS — I4891 Unspecified atrial fibrillation: Secondary | ICD-10-CM | POA: Diagnosis not present

## 2012-07-29 DIAGNOSIS — Z125 Encounter for screening for malignant neoplasm of prostate: Secondary | ICD-10-CM | POA: Diagnosis not present

## 2012-07-29 DIAGNOSIS — Z79899 Other long term (current) drug therapy: Secondary | ICD-10-CM | POA: Diagnosis not present

## 2012-07-29 DIAGNOSIS — I10 Essential (primary) hypertension: Secondary | ICD-10-CM | POA: Diagnosis not present

## 2012-07-29 DIAGNOSIS — M1710 Unilateral primary osteoarthritis, unspecified knee: Secondary | ICD-10-CM | POA: Diagnosis not present

## 2012-08-30 ENCOUNTER — Other Ambulatory Visit: Payer: Self-pay | Admitting: Internal Medicine

## 2012-09-21 ENCOUNTER — Telehealth: Payer: Self-pay | Admitting: Internal Medicine

## 2012-09-21 NOTE — Telephone Encounter (Signed)
New problem   Pt is having dizziness and lightheadedness. Please call pt concerning this problem.

## 2012-09-21 NOTE — Telephone Encounter (Signed)
Has had 2 episodes of dizziness.  He has not checked his BP during these episodes.  One was on the tennis court and the other [place] at the mall.  Both lasted 30 seconds.  His heart was regular and not racing either time.  He is just concerned because this has never happened before.  He is having some dental surgery on Thurs and is on an antibiotic.  His regular PCP put him on the BP medication and the only thing he noticed was swelling in ankles and so the PCP lowered his dose of Amlodipine to 2.5mg  daily  BP run 130-140/80-90

## 2012-09-21 NOTE — Telephone Encounter (Signed)
Discussed with Dr Ladona Ridgel will obtain a 30day monitor for afib and make sure he is not having bradycardia with pauses

## 2012-09-24 NOTE — Telephone Encounter (Signed)
CAlled patient back and he feels this was all related to a tooth problem (infection) he was having.  We are going to hold off on the monitor for now and if he has any further problems he is going to let me know and we will proceed with the monitor.

## 2012-12-21 ENCOUNTER — Ambulatory Visit (INDEPENDENT_AMBULATORY_CARE_PROVIDER_SITE_OTHER): Payer: Medicare Other | Admitting: Internal Medicine

## 2012-12-21 ENCOUNTER — Encounter: Payer: Self-pay | Admitting: Internal Medicine

## 2012-12-21 VITALS — BP 161/95 | HR 65 | Ht 71.0 in | Wt 204.4 lb

## 2012-12-21 DIAGNOSIS — R42 Dizziness and giddiness: Secondary | ICD-10-CM | POA: Diagnosis not present

## 2012-12-21 DIAGNOSIS — I4891 Unspecified atrial fibrillation: Secondary | ICD-10-CM | POA: Diagnosis not present

## 2012-12-21 MED ORDER — AMLODIPINE BESYLATE 2.5 MG PO TABS: 2.5000 mg | ORAL_TABLET | Freq: Every day | ORAL | Status: DC

## 2012-12-21 NOTE — Patient Instructions (Addendum)
Your physician wants you to follow-up in: 12 months with Dr Court Joy will receive a reminder letter in the mail two months in advance. If you don't receive a letter, please call our office to schedule the follow-up appointment.   Your physician has recommended you make the following change in your medication:  1) Take 2.5mg  of Amlodipine daily

## 2012-12-21 NOTE — Progress Notes (Signed)
HPI [name] returns today for followup. He is a 72 year old man with a history of chronic atrial fibrillation, rate controlled, borderline hypertension, and arthritis. He remains active, and denies chest pain, shortness of breath, or syncope. He has multiple questions today about blood pressure and vascular disease. No peripheral edema, after reducing his dose of amlodipine. The patient describes 2 episodes of dizziness which occurred several months ago, and resolved. We were initially going to have him wear a cardiac monitor that he was diagnosed with a tooth abscess, treated, and his dizziness has resolved.  No Known Allergies   Current Outpatient Prescriptions  Medication Sig Dispense Refill  . amLODipine (NORVASC) 2.5 MG tablet Take 1 tablet (2.5 mg total) by mouth daily.  90 tablet  3  . amoxicillin (AMOXIL) 500 MG capsule Take 4 tabs 1 hour prior to dental procedure      . aspirin 81 MG tablet Take 81 mg by mouth daily.        Marland Kitchen ibuprofen (ADVIL,MOTRIN) 200 MG tablet Take 200 mg by mouth every 6 (six) hours as needed.        Marland Kitchen losartan (COZAAR) 100 MG tablet TAKE 1 TABLET BY MOUTH EVERY DAY  30 tablet  5  . Multiple Vitamins-Minerals (MULTIVITAMIN PO) Take by mouth daily.       No current facility-administered medications for this visit.     Past Medical History  Diagnosis Date  . Prostate cancer   . Injury of brachial plexus   . Torn rotator cuff   . Brachial plexus injury 1995    Left arm  . Torn rotator cuff 2000    Right arm  . HTN (hypertension)     6 years  . Prostate cancer   . Atrial fibrillation     takes aspirin for this    ROS:   All systems reviewed and negative except as noted in the HPI.   Past Surgical History  Procedure Laterality Date  . Int fixation left forearm  01/19/1989  . Prostatectomy    . Removal of basal cell    . Ett  2006    High fitness but bp of 255  . Knee surgery      left     Family History  Problem Relation Age of Onset  .  Heart attack Father     MI  . Obesity Brother   . Parkinsonism Brother   . Prostate cancer Brother   . Colon cancer Neg Hx   . Esophageal cancer Neg Hx   . Stomach cancer Neg Hx   . Rectal cancer Neg Hx      History   Social History  . Marital Status: Married    Spouse Name: N/A    Number of Children: N/A  . Years of Education: N/A   Occupational History  . Realtor    Social History Main Topics  . Smoking status: Former Smoker    Quit date: 04/21/1985  . Smokeless tobacco: Never Used  . Alcohol Use: Yes     Comment: Social  . Drug Use: No  . Sexual Activity: Not on file   Other Topics Concern  . 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 HEART:  IRegular rate rhythm, no murmurs, no rubs, no clicks Abd:  soft, positive bowel sounds, no organomegally, no rebound, no guarding Ext:  2 plus pulses, no edema, no cyanosis, no clubbing Skin:  No rashes no nodules Neuro:  CN II through XII intact, motor grossly intact  EKG - atrial fibrillation with controlled ventricular response  Assess/Plan:

## 2012-12-21 NOTE — Assessment & Plan Note (Signed)
The etiology is unclear. His symptoms have resolved. He will undergo watchful waiting. If he has recurrent symptoms, we would have to wear a cardiac monitor.

## 2012-12-21 NOTE — Assessment & Plan Note (Signed)
His ventricular rate is well controlled. He is asymptomatic. He will continue aspirin. When he turns 75, I would anticipate initiation of systemic anticoagulation.

## 2012-12-28 ENCOUNTER — Ambulatory Visit: Payer: Medicare Other | Admitting: Internal Medicine

## 2013-01-06 ENCOUNTER — Other Ambulatory Visit: Payer: Self-pay | Admitting: Internal Medicine

## 2013-01-28 DIAGNOSIS — I4891 Unspecified atrial fibrillation: Secondary | ICD-10-CM | POA: Diagnosis not present

## 2013-01-28 DIAGNOSIS — Z79899 Other long term (current) drug therapy: Secondary | ICD-10-CM | POA: Diagnosis not present

## 2013-01-28 DIAGNOSIS — I10 Essential (primary) hypertension: Secondary | ICD-10-CM | POA: Diagnosis not present

## 2013-01-28 DIAGNOSIS — M1710 Unilateral primary osteoarthritis, unspecified knee: Secondary | ICD-10-CM | POA: Diagnosis not present

## 2013-02-10 DIAGNOSIS — Z23 Encounter for immunization: Secondary | ICD-10-CM | POA: Diagnosis not present

## 2013-06-17 DIAGNOSIS — J029 Acute pharyngitis, unspecified: Secondary | ICD-10-CM | POA: Diagnosis not present

## 2013-07-03 ENCOUNTER — Other Ambulatory Visit: Payer: Self-pay | Admitting: Internal Medicine

## 2013-07-29 DIAGNOSIS — I4891 Unspecified atrial fibrillation: Secondary | ICD-10-CM | POA: Diagnosis not present

## 2013-07-29 DIAGNOSIS — I10 Essential (primary) hypertension: Secondary | ICD-10-CM | POA: Diagnosis not present

## 2013-07-29 DIAGNOSIS — Z125 Encounter for screening for malignant neoplasm of prostate: Secondary | ICD-10-CM | POA: Diagnosis not present

## 2013-07-29 DIAGNOSIS — M1710 Unilateral primary osteoarthritis, unspecified knee: Secondary | ICD-10-CM | POA: Diagnosis not present

## 2013-07-29 DIAGNOSIS — M7710 Lateral epicondylitis, unspecified elbow: Secondary | ICD-10-CM | POA: Diagnosis not present

## 2013-07-29 DIAGNOSIS — Z79899 Other long term (current) drug therapy: Secondary | ICD-10-CM | POA: Diagnosis not present

## 2013-12-29 ENCOUNTER — Ambulatory Visit (INDEPENDENT_AMBULATORY_CARE_PROVIDER_SITE_OTHER): Payer: Medicare Other | Admitting: Internal Medicine

## 2013-12-29 ENCOUNTER — Encounter: Payer: Self-pay | Admitting: Internal Medicine

## 2013-12-29 VITALS — BP 128/88 | HR 75 | Ht 71.0 in | Wt 200.6 lb

## 2013-12-29 DIAGNOSIS — I4891 Unspecified atrial fibrillation: Secondary | ICD-10-CM | POA: Diagnosis not present

## 2013-12-29 DIAGNOSIS — I10 Essential (primary) hypertension: Secondary | ICD-10-CM | POA: Diagnosis not present

## 2013-12-29 MED ORDER — WARFARIN SODIUM 5 MG PO TABS: 5.0000 mg | ORAL_TABLET | Freq: Every day | ORAL | Status: DC

## 2013-12-29 NOTE — Progress Notes (Signed)
HPI [name] returns today for followup. He is a 73 year old man with a history of chronic atrial fibrillation, rate controlled, borderline hypertension, and arthritis. He remains active, and denies chest pain, shortness of breath, or syncope. He now clearly has HTN. He is on 3 meds and his blood pressure is high normal.  No Known Allergies   Current Outpatient Prescriptions  Medication Sig Dispense Refill  . amLODipine (NORVASC) 2.5 MG tablet Take 1 tablet (2.5 mg total) by mouth daily.  90 tablet  3  . amoxicillin (AMOXIL) 500 MG capsule Take 4 tabs 1 hour prior to dental procedure      . aspirin 81 MG tablet Take 81 mg by mouth daily.        . hydrochlorothiazide (MICROZIDE) 12.5 MG capsule Take 1 capsule by mouth daily.      Marland Kitchen ibuprofen (ADVIL,MOTRIN) 200 MG tablet Take 200 mg by mouth every 6 (six) hours as needed (pain).       Marland Kitchen losartan (COZAAR) 100 MG tablet TAKE 1 TABLET BY MOUTH EVERY DAY  30 tablet  5   No current facility-administered medications for this visit.     Past Medical History  Diagnosis Date  . Prostate cancer   . Injury of brachial plexus   . Torn rotator cuff   . Brachial plexus injury 1995    Left arm  . Torn rotator cuff 2000    Right arm  . HTN (hypertension)     6 years  . Prostate cancer   . Atrial fibrillation     takes aspirin for this    ROS:   All systems reviewed and negative except as noted in the HPI.   Past Surgical History  Procedure Laterality Date  . Int fixation left forearm  01/19/1989  . Prostatectomy    . Removal of basal cell    . Ett  2006    High fitness but bp of 255  . Knee surgery      left     Family History  Problem Relation Age of Onset  . Heart attack Father     MI  . Obesity Brother   . Parkinsonism Brother   . Prostate cancer Brother   . Colon cancer Neg Hx   . Esophageal cancer Neg Hx   . Stomach cancer Neg Hx   . Rectal cancer Neg Hx      History   Social History  . Marital Status: Married    Spouse Name: N/A    Number of Children: N/A  . Years of Education: N/A   Occupational History  . Realtor    Social History Main Topics  . Smoking status: Former Smoker    Quit date: 04/21/1985  . Smokeless tobacco: Never Used  . Alcohol Use: Yes     Comment: Social  . Drug Use: No  . Sexual Activity: Not on file   Other Topics Concern  . 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 HEART:  IRegular rate rhythm, no murmurs, no rubs, no clicks Abd:  soft, positive bowel sounds, no organomegally, no rebound, no guarding Ext:  2 plus pulses, no edema, no cyanosis, no clubbing Skin:  No rashes no nodules Neuro:  CN II through XII intact, motor grossly intact  EKG - atrial fibrillation with controlled ventricular response  Assess/Plan:

## 2013-12-29 NOTE — Patient Instructions (Signed)
Your physician wants you to follow-up in: 12 months with Dr Knox Saliva will receive a reminder letter in the mail two months in advance. If you don't receive a letter, please call our office to schedule the follow-up appointment.  You have been referred to Coumadin clinic  Needs to be seen next Tues for INR  Your physician has recommended you make the following change in your medication:  1) Start Warfarin 5mg  as directed

## 2013-12-30 ENCOUNTER — Encounter: Payer: Self-pay | Admitting: Internal Medicine

## 2013-12-30 ENCOUNTER — Other Ambulatory Visit: Payer: Self-pay | Admitting: Internal Medicine

## 2013-12-30 ENCOUNTER — Other Ambulatory Visit: Payer: Self-pay | Admitting: Dermatology

## 2013-12-30 DIAGNOSIS — L570 Actinic keratosis: Secondary | ICD-10-CM | POA: Diagnosis not present

## 2013-12-30 DIAGNOSIS — L821 Other seborrheic keratosis: Secondary | ICD-10-CM | POA: Diagnosis not present

## 2013-12-30 DIAGNOSIS — D2370 Other benign neoplasm of skin of unspecified lower limb, including hip: Secondary | ICD-10-CM | POA: Diagnosis not present

## 2013-12-30 DIAGNOSIS — D2360 Other benign neoplasm of skin of unspecified upper limb, including shoulder: Secondary | ICD-10-CM | POA: Diagnosis not present

## 2013-12-30 DIAGNOSIS — D485 Neoplasm of uncertain behavior of skin: Secondary | ICD-10-CM | POA: Diagnosis not present

## 2013-12-30 DIAGNOSIS — D235 Other benign neoplasm of skin of trunk: Secondary | ICD-10-CM | POA: Diagnosis not present

## 2013-12-30 DIAGNOSIS — C44519 Basal cell carcinoma of skin of other part of trunk: Secondary | ICD-10-CM | POA: Diagnosis not present

## 2013-12-30 DIAGNOSIS — D239 Other benign neoplasm of skin, unspecified: Secondary | ICD-10-CM | POA: Diagnosis not present

## 2013-12-30 DIAGNOSIS — D1801 Hemangioma of skin and subcutaneous tissue: Secondary | ICD-10-CM | POA: Diagnosis not present

## 2013-12-30 NOTE — Assessment & Plan Note (Signed)
At this point, his risk for stroke is sufficient for initiation of systemic anti-coagulation. We will start coumadin, although we discussed NOAC's and he would like to start with coumadin. His rate is controlled.

## 2013-12-30 NOTE — Assessment & Plan Note (Signed)
HIs blood pressure is slightly elevated. He is encourage to reduce his salt intake and lose weight.

## 2014-01-03 ENCOUNTER — Ambulatory Visit (INDEPENDENT_AMBULATORY_CARE_PROVIDER_SITE_OTHER): Payer: Medicare Other | Admitting: *Deleted

## 2014-01-03 DIAGNOSIS — I482 Chronic atrial fibrillation, unspecified: Secondary | ICD-10-CM

## 2014-01-03 DIAGNOSIS — I4891 Unspecified atrial fibrillation: Secondary | ICD-10-CM | POA: Diagnosis not present

## 2014-01-03 DIAGNOSIS — Z7189 Other specified counseling: Secondary | ICD-10-CM | POA: Insufficient documentation

## 2014-01-03 DIAGNOSIS — Z5181 Encounter for therapeutic drug level monitoring: Secondary | ICD-10-CM

## 2014-01-03 LAB — POCT INR: INR: 1.4

## 2014-01-03 NOTE — Patient Instructions (Signed)

## 2014-01-04 ENCOUNTER — Telehealth: Payer: Self-pay | Admitting: *Deleted

## 2014-01-04 NOTE — Telephone Encounter (Signed)
Message copied by Margretta Sidle on Wed Jan 04, 2014  1:38 PM ------      Message from: Dionicio Stall      Created: Wed Jan 04, 2014 11:31 AM       He asked that he follow up with CVRR clinic and once therapeutic ok to stop      ----- Message -----         From: Margretta Sidle, RN         Sent: 01/03/2014   4:57 PM           To: Dionicio Stall, RN, Evans Lance, MD            Dr Annitta Needs pt in coumadin clinic this afternoon and he states was instructed to stop ASA       Still on med list      Please advise      Thanks       Elbert Ewings RN       ------

## 2014-01-04 NOTE — Telephone Encounter (Signed)
This nurse called pt and instructed that he should continue Aspirin 81mg  daily until INR therapeutic and he states understanding

## 2014-01-06 ENCOUNTER — Ambulatory Visit (INDEPENDENT_AMBULATORY_CARE_PROVIDER_SITE_OTHER): Payer: Medicare Other

## 2014-01-06 DIAGNOSIS — I4891 Unspecified atrial fibrillation: Secondary | ICD-10-CM

## 2014-01-06 DIAGNOSIS — Z5181 Encounter for therapeutic drug level monitoring: Secondary | ICD-10-CM | POA: Diagnosis not present

## 2014-01-06 DIAGNOSIS — I482 Chronic atrial fibrillation, unspecified: Secondary | ICD-10-CM

## 2014-01-06 LAB — POCT INR: INR: 1.4

## 2014-01-09 ENCOUNTER — Telehealth: Payer: Self-pay | Admitting: *Deleted

## 2014-01-09 NOTE — Telephone Encounter (Signed)
Message copied by Margretta Sidle on Mon Jan 09, 2014  9:42 AM ------      Message from: Evans Lance      Created: Mon Jan 09, 2014  8:51 AM       Stop ASA.       ----- Message -----         From: Margretta Sidle, RN         Sent: 01/03/2014   4:57 PM           To: Dionicio Stall, RN, Evans Lance, MD            Dr Annitta Needs pt in coumadin clinic this afternoon and he states was instructed to stop ASA       Still on med list      Please advise      Thanks       Elbert Ewings RN       ------

## 2014-01-09 NOTE — Telephone Encounter (Signed)
Pt's wife instructed to have pt stop ASA and she states understanding

## 2014-01-13 ENCOUNTER — Ambulatory Visit (INDEPENDENT_AMBULATORY_CARE_PROVIDER_SITE_OTHER): Payer: Medicare Other

## 2014-01-13 DIAGNOSIS — I4891 Unspecified atrial fibrillation: Secondary | ICD-10-CM | POA: Diagnosis not present

## 2014-01-13 DIAGNOSIS — C44519 Basal cell carcinoma of skin of other part of trunk: Secondary | ICD-10-CM | POA: Diagnosis not present

## 2014-01-13 DIAGNOSIS — I482 Chronic atrial fibrillation, unspecified: Secondary | ICD-10-CM

## 2014-01-13 DIAGNOSIS — Z5181 Encounter for therapeutic drug level monitoring: Secondary | ICD-10-CM | POA: Diagnosis not present

## 2014-01-13 LAB — POCT INR: INR: 2.2

## 2014-01-20 ENCOUNTER — Ambulatory Visit (INDEPENDENT_AMBULATORY_CARE_PROVIDER_SITE_OTHER): Payer: Medicare Other

## 2014-01-20 DIAGNOSIS — I482 Chronic atrial fibrillation, unspecified: Secondary | ICD-10-CM

## 2014-01-20 DIAGNOSIS — I4891 Unspecified atrial fibrillation: Secondary | ICD-10-CM

## 2014-01-20 DIAGNOSIS — Z5181 Encounter for therapeutic drug level monitoring: Secondary | ICD-10-CM | POA: Diagnosis not present

## 2014-01-20 LAB — POCT INR: INR: 2.3

## 2014-01-27 DIAGNOSIS — Z23 Encounter for immunization: Secondary | ICD-10-CM | POA: Diagnosis not present

## 2014-01-27 DIAGNOSIS — T310 Burns involving less than 10% of body surface: Secondary | ICD-10-CM | POA: Diagnosis not present

## 2014-01-30 DIAGNOSIS — I10 Essential (primary) hypertension: Secondary | ICD-10-CM | POA: Diagnosis not present

## 2014-01-30 DIAGNOSIS — S39011A Strain of muscle, fascia and tendon of abdomen, initial encounter: Secondary | ICD-10-CM | POA: Diagnosis not present

## 2014-01-30 DIAGNOSIS — I4891 Unspecified atrial fibrillation: Secondary | ICD-10-CM | POA: Diagnosis not present

## 2014-01-30 DIAGNOSIS — T3110 Burns involving 10-19% of body surface with 0% to 9% third degree burns: Secondary | ICD-10-CM | POA: Diagnosis not present

## 2014-01-30 DIAGNOSIS — M170 Bilateral primary osteoarthritis of knee: Secondary | ICD-10-CM | POA: Diagnosis not present

## 2014-02-09 DIAGNOSIS — E11329 Type 2 diabetes mellitus with mild nonproliferative diabetic retinopathy without macular edema: Secondary | ICD-10-CM | POA: Diagnosis not present

## 2014-03-02 DIAGNOSIS — H2513 Age-related nuclear cataract, bilateral: Secondary | ICD-10-CM | POA: Diagnosis not present

## 2014-03-03 ENCOUNTER — Other Ambulatory Visit: Payer: Self-pay | Admitting: Internal Medicine

## 2014-03-13 ENCOUNTER — Other Ambulatory Visit: Payer: Self-pay | Admitting: *Deleted

## 2014-03-13 NOTE — Telephone Encounter (Signed)
Returned call to pharmacy as requested

## 2014-03-13 NOTE — Telephone Encounter (Signed)
[facility] on [address] called for clarification of this patients coumadin. The number is [phone]. Please advise. Thanks, MI

## 2014-03-13 NOTE — Telephone Encounter (Signed)
Pt overdue for f/u last seen 10/2, NOS for appt on 02/03/14.  Clarified 45 tablets is a 1 month supply, sig should be take as directed by Coumadin clinic.  Gave verbal order to pharmacy to pull all refills because pt is past due for INR check.

## 2014-03-14 ENCOUNTER — Telehealth: Payer: Self-pay | Admitting: Internal Medicine

## 2014-03-14 NOTE — Telephone Encounter (Signed)
New Message         Pt calling stating he is having some issues at the pharmacy in regards to his warfarin. Please call pt back and advise.

## 2014-03-14 NOTE — Telephone Encounter (Signed)
Returned call to pt explained we cannot refill rx until seen in office, pt overdue for f/u.  Pt has appt on Wednesday 03/15/14, will refill rx at that time and give 45 tablets for a 30 day supply.

## 2014-03-15 ENCOUNTER — Ambulatory Visit (INDEPENDENT_AMBULATORY_CARE_PROVIDER_SITE_OTHER): Payer: Medicare Other | Admitting: Pharmacist

## 2014-03-15 DIAGNOSIS — I4891 Unspecified atrial fibrillation: Secondary | ICD-10-CM | POA: Diagnosis not present

## 2014-03-15 DIAGNOSIS — I482 Chronic atrial fibrillation, unspecified: Secondary | ICD-10-CM

## 2014-03-15 DIAGNOSIS — Z5181 Encounter for therapeutic drug level monitoring: Secondary | ICD-10-CM | POA: Diagnosis not present

## 2014-03-15 LAB — POCT INR: INR: 2.5

## 2014-03-15 MED ORDER — WARFARIN SODIUM 5 MG PO TABS: 5.0000 mg | ORAL_TABLET | Freq: Every day | ORAL | Status: DC

## 2014-04-20 ENCOUNTER — Ambulatory Visit (INDEPENDENT_AMBULATORY_CARE_PROVIDER_SITE_OTHER): Payer: Medicare Other | Admitting: Pharmacist Clinician (PhC)/ Clinical Pharmacy Specialist

## 2014-04-20 ENCOUNTER — Other Ambulatory Visit: Payer: Self-pay | Admitting: Internal Medicine

## 2014-04-20 DIAGNOSIS — I4891 Unspecified atrial fibrillation: Secondary | ICD-10-CM | POA: Diagnosis not present

## 2014-04-20 DIAGNOSIS — Z5181 Encounter for therapeutic drug level monitoring: Secondary | ICD-10-CM | POA: Diagnosis not present

## 2014-04-20 DIAGNOSIS — I482 Chronic atrial fibrillation, unspecified: Secondary | ICD-10-CM

## 2014-04-20 LAB — POCT INR: INR: 3

## 2014-04-25 ENCOUNTER — Telehealth: Payer: Self-pay

## 2014-04-25 ENCOUNTER — Other Ambulatory Visit: Payer: Self-pay | Admitting: Internal Medicine

## 2014-04-25 DIAGNOSIS — I4891 Unspecified atrial fibrillation: Secondary | ICD-10-CM

## 2014-04-25 MED ORDER — WARFARIN SODIUM 5 MG PO TABS: ORAL_TABLET | ORAL | Status: DC

## 2014-04-25 NOTE — Telephone Encounter (Signed)
Warfarin refill sent to pharmacy as requested/ewj

## 2014-05-02 ENCOUNTER — Other Ambulatory Visit: Payer: Self-pay | Admitting: Dermatology

## 2014-05-02 DIAGNOSIS — L910 Hypertrophic scar: Secondary | ICD-10-CM | POA: Diagnosis not present

## 2014-05-02 DIAGNOSIS — L820 Inflamed seborrheic keratosis: Secondary | ICD-10-CM | POA: Diagnosis not present

## 2014-05-02 DIAGNOSIS — Z79899 Other long term (current) drug therapy: Secondary | ICD-10-CM | POA: Diagnosis not present

## 2014-05-02 DIAGNOSIS — L821 Other seborrheic keratosis: Secondary | ICD-10-CM | POA: Diagnosis not present

## 2014-05-02 DIAGNOSIS — L570 Actinic keratosis: Secondary | ICD-10-CM | POA: Diagnosis not present

## 2014-05-02 DIAGNOSIS — I8312 Varicose veins of left lower extremity with inflammation: Secondary | ICD-10-CM | POA: Diagnosis not present

## 2014-05-02 DIAGNOSIS — B351 Tinea unguium: Secondary | ICD-10-CM | POA: Diagnosis not present

## 2014-05-02 DIAGNOSIS — Z85828 Personal history of other malignant neoplasm of skin: Secondary | ICD-10-CM | POA: Diagnosis not present

## 2014-05-02 DIAGNOSIS — L603 Nail dystrophy: Secondary | ICD-10-CM | POA: Diagnosis not present

## 2014-05-02 DIAGNOSIS — I8311 Varicose veins of right lower extremity with inflammation: Secondary | ICD-10-CM | POA: Diagnosis not present

## 2014-05-18 ENCOUNTER — Ambulatory Visit (INDEPENDENT_AMBULATORY_CARE_PROVIDER_SITE_OTHER): Payer: Medicare Other | Admitting: Pharmacist Clinician (PhC)/ Clinical Pharmacy Specialist

## 2014-05-18 DIAGNOSIS — I4891 Unspecified atrial fibrillation: Secondary | ICD-10-CM | POA: Diagnosis not present

## 2014-05-18 DIAGNOSIS — I482 Chronic atrial fibrillation, unspecified: Secondary | ICD-10-CM

## 2014-05-18 DIAGNOSIS — Z5181 Encounter for therapeutic drug level monitoring: Secondary | ICD-10-CM

## 2014-05-18 LAB — POCT INR: INR: 2.3

## 2014-05-22 ENCOUNTER — Other Ambulatory Visit: Payer: Self-pay | Admitting: Internal Medicine

## 2014-06-13 DIAGNOSIS — Z79899 Other long term (current) drug therapy: Secondary | ICD-10-CM | POA: Diagnosis not present

## 2014-06-13 DIAGNOSIS — I8312 Varicose veins of left lower extremity with inflammation: Secondary | ICD-10-CM | POA: Diagnosis not present

## 2014-06-13 DIAGNOSIS — I8311 Varicose veins of right lower extremity with inflammation: Secondary | ICD-10-CM | POA: Diagnosis not present

## 2014-06-13 DIAGNOSIS — L817 Pigmented purpuric dermatosis: Secondary | ICD-10-CM | POA: Diagnosis not present

## 2014-06-13 DIAGNOSIS — L608 Other nail disorders: Secondary | ICD-10-CM | POA: Diagnosis not present

## 2014-06-13 DIAGNOSIS — B351 Tinea unguium: Secondary | ICD-10-CM | POA: Diagnosis not present

## 2014-06-15 ENCOUNTER — Ambulatory Visit (INDEPENDENT_AMBULATORY_CARE_PROVIDER_SITE_OTHER): Payer: Medicare Other

## 2014-06-15 DIAGNOSIS — I482 Chronic atrial fibrillation, unspecified: Secondary | ICD-10-CM

## 2014-06-15 DIAGNOSIS — I4891 Unspecified atrial fibrillation: Secondary | ICD-10-CM

## 2014-06-15 DIAGNOSIS — Z5181 Encounter for therapeutic drug level monitoring: Secondary | ICD-10-CM

## 2014-06-15 LAB — POCT INR: INR: 2.3

## 2014-06-27 ENCOUNTER — Other Ambulatory Visit: Payer: Self-pay | Admitting: Internal Medicine

## 2014-07-27 ENCOUNTER — Ambulatory Visit (INDEPENDENT_AMBULATORY_CARE_PROVIDER_SITE_OTHER): Payer: Medicare Other | Admitting: *Deleted

## 2014-07-27 DIAGNOSIS — Z5181 Encounter for therapeutic drug level monitoring: Secondary | ICD-10-CM | POA: Diagnosis not present

## 2014-07-27 DIAGNOSIS — I482 Chronic atrial fibrillation, unspecified: Secondary | ICD-10-CM

## 2014-07-27 DIAGNOSIS — I4891 Unspecified atrial fibrillation: Secondary | ICD-10-CM

## 2014-07-27 LAB — POCT INR: INR: 2.2

## 2014-08-04 DIAGNOSIS — I10 Essential (primary) hypertension: Secondary | ICD-10-CM | POA: Diagnosis not present

## 2014-08-04 DIAGNOSIS — M170 Bilateral primary osteoarthritis of knee: Secondary | ICD-10-CM | POA: Diagnosis not present

## 2014-08-04 DIAGNOSIS — R1030 Lower abdominal pain, unspecified: Secondary | ICD-10-CM | POA: Diagnosis not present

## 2014-08-04 DIAGNOSIS — I4891 Unspecified atrial fibrillation: Secondary | ICD-10-CM | POA: Diagnosis not present

## 2014-08-29 DIAGNOSIS — I8311 Varicose veins of right lower extremity with inflammation: Secondary | ICD-10-CM | POA: Diagnosis not present

## 2014-08-29 DIAGNOSIS — L821 Other seborrheic keratosis: Secondary | ICD-10-CM | POA: Diagnosis not present

## 2014-08-29 DIAGNOSIS — I872 Venous insufficiency (chronic) (peripheral): Secondary | ICD-10-CM | POA: Diagnosis not present

## 2014-08-29 DIAGNOSIS — B351 Tinea unguium: Secondary | ICD-10-CM | POA: Diagnosis not present

## 2014-08-29 DIAGNOSIS — L570 Actinic keratosis: Secondary | ICD-10-CM | POA: Diagnosis not present

## 2014-08-29 DIAGNOSIS — I8312 Varicose veins of left lower extremity with inflammation: Secondary | ICD-10-CM | POA: Diagnosis not present

## 2014-09-07 ENCOUNTER — Ambulatory Visit (INDEPENDENT_AMBULATORY_CARE_PROVIDER_SITE_OTHER): Payer: Medicare Other | Admitting: *Deleted

## 2014-09-07 DIAGNOSIS — I4891 Unspecified atrial fibrillation: Secondary | ICD-10-CM

## 2014-09-07 DIAGNOSIS — Z5181 Encounter for therapeutic drug level monitoring: Secondary | ICD-10-CM | POA: Diagnosis not present

## 2014-09-07 DIAGNOSIS — I482 Chronic atrial fibrillation, unspecified: Secondary | ICD-10-CM

## 2014-09-07 LAB — POCT INR: INR: 3

## 2014-10-19 ENCOUNTER — Ambulatory Visit (INDEPENDENT_AMBULATORY_CARE_PROVIDER_SITE_OTHER): Payer: Medicare Other | Admitting: *Deleted

## 2014-10-19 DIAGNOSIS — I482 Chronic atrial fibrillation, unspecified: Secondary | ICD-10-CM

## 2014-10-19 DIAGNOSIS — I4891 Unspecified atrial fibrillation: Secondary | ICD-10-CM | POA: Diagnosis not present

## 2014-10-19 DIAGNOSIS — Z5181 Encounter for therapeutic drug level monitoring: Secondary | ICD-10-CM

## 2014-10-19 LAB — POCT INR: INR: 2.7

## 2014-10-19 MED ORDER — WARFARIN SODIUM 5 MG PO TABS: 5.0000 mg | ORAL_TABLET | ORAL | Status: DC

## 2014-12-01 DIAGNOSIS — I4891 Unspecified atrial fibrillation: Secondary | ICD-10-CM | POA: Diagnosis not present

## 2014-12-01 LAB — PROTIME-INR: INR: 2 — AB (ref 0.9–1.1)

## 2014-12-04 ENCOUNTER — Ambulatory Visit (INDEPENDENT_AMBULATORY_CARE_PROVIDER_SITE_OTHER): Payer: Medicare Other | Admitting: Cardiology

## 2014-12-04 DIAGNOSIS — I4891 Unspecified atrial fibrillation: Secondary | ICD-10-CM

## 2014-12-04 DIAGNOSIS — Z5181 Encounter for therapeutic drug level monitoring: Secondary | ICD-10-CM

## 2014-12-25 ENCOUNTER — Other Ambulatory Visit: Payer: Self-pay | Admitting: Internal Medicine

## 2014-12-29 ENCOUNTER — Other Ambulatory Visit: Payer: Self-pay | Admitting: Internal Medicine

## 2015-01-04 ENCOUNTER — Other Ambulatory Visit: Payer: Self-pay | Admitting: Internal Medicine

## 2015-01-05 ENCOUNTER — Ambulatory Visit (INDEPENDENT_AMBULATORY_CARE_PROVIDER_SITE_OTHER): Payer: Medicare Other | Admitting: Pharmacist

## 2015-01-05 ENCOUNTER — Encounter: Payer: Self-pay | Admitting: Internal Medicine

## 2015-01-05 ENCOUNTER — Ambulatory Visit (INDEPENDENT_AMBULATORY_CARE_PROVIDER_SITE_OTHER): Payer: Medicare Other | Admitting: Internal Medicine

## 2015-01-05 VITALS — BP 140/70 | HR 72 | Ht 71.0 in | Wt 199.6 lb

## 2015-01-05 DIAGNOSIS — I10 Essential (primary) hypertension: Secondary | ICD-10-CM

## 2015-01-05 DIAGNOSIS — I482 Chronic atrial fibrillation, unspecified: Secondary | ICD-10-CM

## 2015-01-05 DIAGNOSIS — Z5181 Encounter for therapeutic drug level monitoring: Secondary | ICD-10-CM

## 2015-01-05 DIAGNOSIS — R42 Dizziness and giddiness: Secondary | ICD-10-CM | POA: Diagnosis not present

## 2015-01-05 LAB — POCT INR: INR: 1.8

## 2015-01-05 NOTE — Assessment & Plan Note (Signed)
We discussed the possiblity of switching to a NOAC but he would like to continue coumadin for now.

## 2015-01-05 NOTE — Progress Notes (Signed)
HPI Glenn [name] returns today for followup. He is a 74 year old man with a history of chronic atrial fibrillation, rate controlled, borderline hypertension, and arthritis. He remains active, and denies chest pain, shortness of breath, or syncope. He now clearly has HTN. He is on 3 meds and his blood pressure is high normal. He has been under increased stress recently over the apparent suicide of his grandson who died several months ago.  No Known Allergies   Current Outpatient Prescriptions  Medication Sig Dispense Refill  . amLODipine (NORVASC) 2.5 MG tablet TAKE 1 TABLET (2.5 MG TOTAL) BY MOUTH DAILY. 90 tablet 3  . amoxicillin (AMOXIL) 500 MG capsule Take 4 tabs 1 hour prior to dental procedure    . aspirin EC 81 MG tablet Take 81 mg by mouth daily.    . hydrochlorothiazide (MICROZIDE) 12.5 MG capsule Take 1 capsule by mouth daily.    Marland Kitchen losartan (COZAAR) 100 MG tablet TAKE 1 TABLET EVERY DAY 30 tablet 0  . warfarin (COUMADIN) 5 MG tablet Take 1 tablet (5 mg total) by mouth as directed. 135 tablet 2   No current facility-administered medications for this visit.     Past Medical History  Diagnosis Date  . Prostate cancer   . Injury of brachial plexus   . Torn rotator cuff   . Brachial plexus injury 1995    Left arm  . Torn rotator cuff 2000    Right arm  . HTN (hypertension)     6 years  . Prostate cancer   . Atrial fibrillation     takes aspirin for this    ROS:   All systems reviewed and negative except as noted in the HPI.   Past Surgical History  Procedure Laterality Date  . Int fixation left forearm  01/19/1989  . Prostatectomy    . Removal of basal cell    . Ett  2006    High fitness but bp of 255  . Knee surgery      left     Family History  Problem Relation Age of Onset  . Heart attack Father     MI  . Obesity Brother   . Parkinsonism Brother   . Prostate cancer Brother   . Colon cancer Neg Hx   . Esophageal cancer Neg Hx   . Stomach cancer Neg Hx   .  Rectal cancer Neg Hx      Social History   Social History  . Marital Status: Married    Spouse Name: N/A  . Number of Children: N/A  . Years of Education: N/A   Occupational History  . Realtor    Social History Main Topics  . Smoking status: Former Smoker    Quit date: 04/21/1985  . Smokeless tobacco: Never Used  . Alcohol Use: Yes     Comment: Social  . Drug Use: No  . Sexual Activity: Not on file   Other Topics Concern  . 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 HEART:  IRegular rate rhythm, no murmurs, no rubs, no clicks Abd:  soft, positive bowel sounds, no organomegally, no rebound, no guarding Ext:  2 plus pulses, no edema, no cyanosis, no clubbing Skin:  No rashes no nodules Neuro:  CN II through XII intact, motor grossly intact  EKG - atrial fibrillation with controlled ventricular response  Assess/Plan:

## 2015-01-05 NOTE — Patient Instructions (Signed)
Medication Instructions:  Your physician recommends that you continue on your current medications as directed. Please refer to the Current Medication list given to you today.  Labwork: None ordered  Testing/Procedures: None ordered  Follow-Up: Your physician wants you to follow-up in: 1 year with Dr. Lachrisha Ziebarth. You will receive a reminder letter in the mail two months in advance. If you don't receive a letter, please call our office to schedule the follow-up appointment.  Any Other Special Instructions Will Be Listed Below (If Applicable). Thank you for choosing [facility]!!       

## 2015-01-05 NOTE — Assessment & Plan Note (Signed)
His blood pressure is high normal. We will continue his current meds.

## 2015-01-05 NOTE — Assessment & Plan Note (Signed)
He has had no recurrent episodes. I suspect he will eventually develop symptomatic bradycardia as he appears to be well rate controled and is on no AV nodal blocking drugs.

## 2015-01-05 NOTE — Assessment & Plan Note (Signed)
His ventricular rate is well controlled. I have asked the patient to continue his current meds. We discussed switching to a NOAC but for now he wishes to continue as he has done by taking coumadin.

## 2015-02-02 ENCOUNTER — Other Ambulatory Visit: Payer: Self-pay | Admitting: Internal Medicine

## 2015-02-09 ENCOUNTER — Ambulatory Visit (INDEPENDENT_AMBULATORY_CARE_PROVIDER_SITE_OTHER): Payer: Medicare Other | Admitting: *Deleted

## 2015-02-09 DIAGNOSIS — I482 Chronic atrial fibrillation, unspecified: Secondary | ICD-10-CM

## 2015-02-09 DIAGNOSIS — Z5181 Encounter for therapeutic drug level monitoring: Secondary | ICD-10-CM | POA: Diagnosis not present

## 2015-02-09 LAB — POCT INR: INR: 2.3

## 2015-02-15 DIAGNOSIS — I4891 Unspecified atrial fibrillation: Secondary | ICD-10-CM | POA: Diagnosis not present

## 2015-02-15 DIAGNOSIS — R202 Paresthesia of skin: Secondary | ICD-10-CM | POA: Diagnosis not present

## 2015-02-15 DIAGNOSIS — M170 Bilateral primary osteoarthritis of knee: Secondary | ICD-10-CM | POA: Diagnosis not present

## 2015-02-15 DIAGNOSIS — Z23 Encounter for immunization: Secondary | ICD-10-CM | POA: Diagnosis not present

## 2015-02-15 DIAGNOSIS — I10 Essential (primary) hypertension: Secondary | ICD-10-CM | POA: Diagnosis not present

## 2015-02-15 DIAGNOSIS — Z136 Encounter for screening for cardiovascular disorders: Secondary | ICD-10-CM | POA: Diagnosis not present

## 2015-02-20 ENCOUNTER — Encounter: Payer: Self-pay | Admitting: Internal Medicine

## 2015-03-02 ENCOUNTER — Other Ambulatory Visit: Payer: Self-pay

## 2015-03-02 MED ORDER — AMLODIPINE BESYLATE 2.5 MG PO TABS: ORAL_TABLET | ORAL | Status: DC

## 2015-04-26 ENCOUNTER — Encounter: Payer: Self-pay | Admitting: Internal Medicine

## 2015-06-29 ENCOUNTER — Ambulatory Visit (INDEPENDENT_AMBULATORY_CARE_PROVIDER_SITE_OTHER): Payer: Medicare Other | Admitting: *Deleted

## 2015-06-29 ENCOUNTER — Telehealth: Payer: Self-pay | Admitting: *Deleted

## 2015-06-29 DIAGNOSIS — I482 Chronic atrial fibrillation, unspecified: Secondary | ICD-10-CM

## 2015-06-29 DIAGNOSIS — Z5181 Encounter for therapeutic drug level monitoring: Secondary | ICD-10-CM | POA: Diagnosis not present

## 2015-06-29 LAB — POCT INR: INR: 2.1

## 2015-06-29 NOTE — Telephone Encounter (Signed)
Patient called and stated that he would like to try xarelto or eliquis. He contacted his pharmacy about the cost of these and was informed that he would need an rx in order to quote him a price. He can be reached at [phone] and would like a call when this has been handled. Thanks, MI

## 2015-06-29 NOTE — Telephone Encounter (Signed)
Pt called back and spoke with Elbert Ewings, RN.  Instructed pt to call customer service number on his insurance card and they would be able to tell him the copay without a prescription being sent to the pharmacy.

## 2015-07-30 ENCOUNTER — Other Ambulatory Visit: Payer: Self-pay | Admitting: *Deleted

## 2015-07-30 MED ORDER — WARFARIN SODIUM 5 MG PO TABS: ORAL_TABLET | ORAL | Status: DC

## 2015-07-30 NOTE — Telephone Encounter (Signed)
Refill done as requested 

## 2015-08-10 ENCOUNTER — Ambulatory Visit (INDEPENDENT_AMBULATORY_CARE_PROVIDER_SITE_OTHER): Payer: Medicare Other | Admitting: Surgery

## 2015-08-10 DIAGNOSIS — I482 Chronic atrial fibrillation, unspecified: Secondary | ICD-10-CM

## 2015-08-10 DIAGNOSIS — Z5181 Encounter for therapeutic drug level monitoring: Secondary | ICD-10-CM

## 2015-08-10 LAB — POCT INR: INR: 2.2

## 2015-08-14 DIAGNOSIS — L298 Other pruritus: Secondary | ICD-10-CM | POA: Diagnosis not present

## 2015-08-14 DIAGNOSIS — D2362 Other benign neoplasm of skin of left upper limb, including shoulder: Secondary | ICD-10-CM | POA: Diagnosis not present

## 2015-08-14 DIAGNOSIS — D692 Other nonthrombocytopenic purpura: Secondary | ICD-10-CM | POA: Diagnosis not present

## 2015-08-14 DIAGNOSIS — D2372 Other benign neoplasm of skin of left lower limb, including hip: Secondary | ICD-10-CM | POA: Diagnosis not present

## 2015-08-14 DIAGNOSIS — D235 Other benign neoplasm of skin of trunk: Secondary | ICD-10-CM | POA: Diagnosis not present

## 2015-08-14 DIAGNOSIS — D485 Neoplasm of uncertain behavior of skin: Secondary | ICD-10-CM | POA: Diagnosis not present

## 2015-08-14 DIAGNOSIS — L218 Other seborrheic dermatitis: Secondary | ICD-10-CM | POA: Diagnosis not present

## 2015-08-14 DIAGNOSIS — L821 Other seborrheic keratosis: Secondary | ICD-10-CM | POA: Diagnosis not present

## 2015-08-17 DIAGNOSIS — I4891 Unspecified atrial fibrillation: Secondary | ICD-10-CM | POA: Diagnosis not present

## 2015-08-17 DIAGNOSIS — M170 Bilateral primary osteoarthritis of knee: Secondary | ICD-10-CM | POA: Diagnosis not present

## 2015-08-17 DIAGNOSIS — I10 Essential (primary) hypertension: Secondary | ICD-10-CM | POA: Diagnosis not present

## 2015-09-21 ENCOUNTER — Ambulatory Visit (INDEPENDENT_AMBULATORY_CARE_PROVIDER_SITE_OTHER): Payer: Medicare Other | Admitting: *Deleted

## 2015-09-21 DIAGNOSIS — I482 Chronic atrial fibrillation, unspecified: Secondary | ICD-10-CM

## 2015-09-21 DIAGNOSIS — Z5181 Encounter for therapeutic drug level monitoring: Secondary | ICD-10-CM

## 2015-09-21 LAB — POCT INR: INR: 2.8

## 2015-09-24 ENCOUNTER — Other Ambulatory Visit: Payer: Self-pay | Admitting: *Deleted

## 2015-09-24 MED ORDER — WARFARIN SODIUM 5 MG PO TABS: ORAL_TABLET | ORAL | Status: DC

## 2015-11-02 ENCOUNTER — Ambulatory Visit (INDEPENDENT_AMBULATORY_CARE_PROVIDER_SITE_OTHER): Payer: Medicare Other | Admitting: *Deleted

## 2015-11-02 ENCOUNTER — Encounter (INDEPENDENT_AMBULATORY_CARE_PROVIDER_SITE_OTHER): Payer: Self-pay

## 2015-11-02 DIAGNOSIS — Z5181 Encounter for therapeutic drug level monitoring: Secondary | ICD-10-CM

## 2015-11-02 DIAGNOSIS — I482 Chronic atrial fibrillation, unspecified: Secondary | ICD-10-CM

## 2015-11-02 LAB — POCT INR: INR: 2.6

## 2015-12-14 ENCOUNTER — Encounter (INDEPENDENT_AMBULATORY_CARE_PROVIDER_SITE_OTHER): Payer: Self-pay

## 2015-12-14 ENCOUNTER — Ambulatory Visit (INDEPENDENT_AMBULATORY_CARE_PROVIDER_SITE_OTHER): Payer: Medicare Other | Admitting: *Deleted

## 2015-12-14 DIAGNOSIS — Z5181 Encounter for therapeutic drug level monitoring: Secondary | ICD-10-CM

## 2015-12-14 DIAGNOSIS — I482 Chronic atrial fibrillation, unspecified: Secondary | ICD-10-CM

## 2015-12-14 LAB — POCT INR: INR: 2.4

## 2016-01-25 ENCOUNTER — Ambulatory Visit (INDEPENDENT_AMBULATORY_CARE_PROVIDER_SITE_OTHER): Payer: Medicare Other | Admitting: *Deleted

## 2016-01-25 DIAGNOSIS — I482 Chronic atrial fibrillation, unspecified: Secondary | ICD-10-CM

## 2016-01-25 DIAGNOSIS — Z5181 Encounter for therapeutic drug level monitoring: Secondary | ICD-10-CM

## 2016-01-25 LAB — POCT INR: INR: 3.2

## 2016-01-28 ENCOUNTER — Other Ambulatory Visit: Payer: Self-pay | Admitting: Internal Medicine

## 2016-01-30 ENCOUNTER — Encounter: Payer: Self-pay | Admitting: Internal Medicine

## 2016-02-12 ENCOUNTER — Encounter: Payer: Self-pay | Admitting: Internal Medicine

## 2016-02-12 ENCOUNTER — Ambulatory Visit (INDEPENDENT_AMBULATORY_CARE_PROVIDER_SITE_OTHER): Payer: Medicare Other | Admitting: Internal Medicine

## 2016-02-12 ENCOUNTER — Encounter (INDEPENDENT_AMBULATORY_CARE_PROVIDER_SITE_OTHER): Payer: Self-pay

## 2016-02-12 VITALS — BP 136/80 | HR 56 | Ht 71.0 in | Wt 193.8 lb

## 2016-02-12 DIAGNOSIS — I482 Chronic atrial fibrillation, unspecified: Secondary | ICD-10-CM

## 2016-02-12 DIAGNOSIS — R0609 Other forms of dyspnea: Secondary | ICD-10-CM | POA: Diagnosis not present

## 2016-02-12 MED ORDER — AMLODIPINE BESYLATE 5 MG PO TABS: 5.0000 mg | ORAL_TABLET | Freq: Every day | ORAL | 3 refills | Status: DC

## 2016-02-12 NOTE — Patient Instructions (Addendum)
Medication Instructions:  INCREASE Amlodipine to 5 mg daily   Labwork: None Ordered   Testing/Procedures: Your physician has requested that you have an exercise tolerance test. For further information please visit [email or web address]. Please also follow instruction sheet, as given.---in 1-2 weeks     Follow-Up: Your physician recommends that you schedule a follow-up appointment in: 3 months with Dr. Lovena Le.      Any Other Special Instructions Will Be Listed Below (If Applicable).     If you need a refill on your cardiac medications before your next appointment, please call your pharmacy.

## 2016-02-12 NOTE — Progress Notes (Signed)
HPI Glenn [name] returns today for followup. He is a 75 year old man with a history of chronic atrial fibrillation, rate controlled, borderline hypertension, and arthritis. He remains active, and denies chest pain, or syncope. He now clearly has HTN. He is on 3 meds and his blood pressure is high. He has experienced some fatigue and dyspnea with exertion.  No Known Allergies   Current Outpatient Prescriptions  Medication Sig Dispense Refill  . amoxicillin (AMOXIL) 500 MG capsule Take 4 caps by mouth 1 hour prior to dental procedure    . aspirin EC 81 MG tablet Take 81 mg by mouth daily.    . hydrochlorothiazide (MICROZIDE) 12.5 MG capsule Take 1 capsule by mouth daily.    Marland Kitchen losartan (COZAAR) 100 MG tablet TAKE 1 TABLET BY MOUTH DAILY 30 tablet 0  . warfarin (COUMADIN) 5 MG tablet Take as directed by coumadin clinic 45 tablet 2  . amLODipine (NORVASC) 5 MG tablet Take 1 tablet (5 mg total) by mouth daily. 90 tablet 3   No current facility-administered medications for this visit.      Past Medical History:  Diagnosis Date  . Atrial fibrillation ([place])    takes aspirin for this  . Brachial plexus injury 1995   Left arm  . HTN (hypertension)    6 years  . Injury of brachial plexus   . Prostate cancer ([place])   . Prostate cancer ([place])   . Torn rotator cuff   . Torn rotator cuff 2000   Right arm    ROS:   All systems reviewed and negative except as noted in the HPI.   Past Surgical History:  Procedure Laterality Date  . ETT  2006   High fitness but bp of 255  . Int fixation left forearm  01/19/1989  . KNEE SURGERY     left  . PROSTATECTOMY    . removal of basal cell       Family History  Problem Relation Age of Onset  . Heart attack Father     MI  . Obesity Brother   . Parkinsonism Brother   . Prostate cancer Brother   . Colon cancer Neg Hx   . Esophageal cancer Neg Hx   . Stomach cancer Neg Hx   . Rectal cancer Neg Hx      Social History   Social History  .  Marital status: Married    Spouse name: N/A  . Number of children: N/A  . Years of education: N/A   Occupational History  . Realtor Retired   Social History Main Topics  . Smoking status: Former Smoker    Quit date: 04/21/1985  . Smokeless tobacco: Never Used  . Alcohol use Yes     Comment: Social  . Drug use: No  . Sexual activity: Not on file   Other Topics Concern  . 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 HEART:  IRegular rate rhythm, no murmurs, no rubs, no clicks Abd:  soft, positive bowel sounds, no organomegally, no rebound, no guarding Ext:  2 plus pulses, no edema, no cyanosis, no clubbing Skin:  No rashes no nodules Neuro:  CN II through XII intact, motor grossly intact  EKG - atrial fibrillation with controlled ventricular response  Assess/Plan: 1. Atrial fib - his rate is controlled. No change in his meds. 2. HTN - his blood pressure has been a little high and I have asked him to increase his amlodipine to 5 mg daily. 3. Sob - he has some exertional dyspnea which new. Will have him attempt to undergo stress testing. 4. Coags - his has tolerated his warfarin. We discussed NOAC's but he wishes to continue warfarin for now.  Glenn [name].D.

## 2016-02-19 ENCOUNTER — Ambulatory Visit (INDEPENDENT_AMBULATORY_CARE_PROVIDER_SITE_OTHER): Payer: Medicare Other

## 2016-02-19 DIAGNOSIS — R0609 Other forms of dyspnea: Secondary | ICD-10-CM | POA: Diagnosis not present

## 2016-02-19 DIAGNOSIS — I10 Essential (primary) hypertension: Secondary | ICD-10-CM | POA: Diagnosis not present

## 2016-02-19 DIAGNOSIS — Z0000 Encounter for general adult medical examination without abnormal findings: Secondary | ICD-10-CM | POA: Diagnosis not present

## 2016-02-19 DIAGNOSIS — Z125 Encounter for screening for malignant neoplasm of prostate: Secondary | ICD-10-CM | POA: Diagnosis not present

## 2016-02-19 DIAGNOSIS — I4891 Unspecified atrial fibrillation: Secondary | ICD-10-CM | POA: Diagnosis not present

## 2016-02-19 DIAGNOSIS — Z23 Encounter for immunization: Secondary | ICD-10-CM | POA: Diagnosis not present

## 2016-02-19 LAB — EXERCISE TOLERANCE TEST
CHL CUP MPHR: 145 {beats}/min
CHL RATE OF PERCEIVED EXERTION: 14
CSEPEDS: 56 s
CSEPHR: 93 %
Estimated workload: 6.9 METS
Exercise duration (min): 4 min
Peak HR: 136 {beats}/min
Rest HR: 60 {beats}/min

## 2016-02-21 ENCOUNTER — Other Ambulatory Visit: Payer: Self-pay | Admitting: Internal Medicine

## 2016-02-27 ENCOUNTER — Other Ambulatory Visit: Payer: Self-pay | Admitting: Internal Medicine

## 2016-02-28 DIAGNOSIS — R0609 Other forms of dyspnea: Principal | ICD-10-CM

## 2016-02-29 NOTE — Telephone Encounter (Signed)
This encounter was created in error - please disregard.

## 2016-03-31 ENCOUNTER — Ambulatory Visit (INDEPENDENT_AMBULATORY_CARE_PROVIDER_SITE_OTHER): Payer: Medicare Other | Admitting: *Deleted

## 2016-03-31 DIAGNOSIS — I482 Chronic atrial fibrillation, unspecified: Secondary | ICD-10-CM

## 2016-03-31 DIAGNOSIS — Z5181 Encounter for therapeutic drug level monitoring: Secondary | ICD-10-CM | POA: Diagnosis not present

## 2016-03-31 LAB — POCT INR: INR: 2.4

## 2016-04-01 ENCOUNTER — Telehealth (HOSPITAL_COMMUNITY): Payer: Self-pay | Admitting: *Deleted

## 2016-04-01 NOTE — Telephone Encounter (Signed)
Patient given detailed instructions per Myocardial Perfusion Study Information Sheet for the test on 04/02/16 at 9:45. Patient notified to arrive 15 minutes early and that it is imperative to arrive on time for appointment to keep from having the test rescheduled.  If you need to cancel or reschedule your appointment, please call the office within 24 hours of your appointment. Failure to do so may result in a cancellation of your appointment, and a $50 no show fee. Patient verbalized understanding.Glenn [name]

## 2016-04-01 NOTE — Telephone Encounter (Signed)
Left message for patient to call back in reference to his testing modality changing from a stress echo to a myoview per Dr. Lovena Le.

## 2016-04-02 ENCOUNTER — Other Ambulatory Visit (HOSPITAL_COMMUNITY): Payer: Medicare Other

## 2016-04-02 ENCOUNTER — Ambulatory Visit (HOSPITAL_COMMUNITY): Payer: Medicare Other | Attending: Cardiology

## 2016-04-02 ENCOUNTER — Encounter (INDEPENDENT_AMBULATORY_CARE_PROVIDER_SITE_OTHER): Payer: Self-pay

## 2016-04-02 DIAGNOSIS — I259 Chronic ischemic heart disease, unspecified: Secondary | ICD-10-CM | POA: Insufficient documentation

## 2016-04-02 DIAGNOSIS — R0609 Other forms of dyspnea: Secondary | ICD-10-CM | POA: Diagnosis not present

## 2016-04-02 DIAGNOSIS — R9439 Abnormal result of other cardiovascular function study: Secondary | ICD-10-CM | POA: Insufficient documentation

## 2016-04-02 LAB — MYOCARDIAL PERFUSION IMAGING
CHL CUP NUCLEAR SDS: 0
CHL CUP NUCLEAR SSS: 0
CSEPPHR: 126 {beats}/min
LV dias vol: 122 mL (ref 62–150)
LVSYSVOL: 46 mL
RATE: 0.27
Rest HR: 55 {beats}/min
SRS: 0
TID: 0.99

## 2016-04-02 MED ORDER — REGADENOSON 0.4 MG/5ML IV SOLN
0.4000 mg | Freq: Once | INTRAVENOUS | Status: AC
  Administered 2016-04-02: 0.4 mg via INTRAVENOUS

## 2016-04-02 MED ORDER — TECHNETIUM TC 99M TETROFOSMIN IV KIT
10.5000 | PACK | Freq: Once | INTRAVENOUS | Status: AC | PRN
  Administered 2016-04-02: 10.5 via INTRAVENOUS
  Filled 2016-04-02: qty 11

## 2016-04-02 MED ORDER — TECHNETIUM TC 99M TETROFOSMIN IV KIT
30.0000 | PACK | Freq: Once | INTRAVENOUS | Status: AC | PRN
  Administered 2016-04-02: 30 via INTRAVENOUS
  Filled 2016-04-02: qty 30

## 2016-04-03 ENCOUNTER — Encounter (HOSPITAL_COMMUNITY): Payer: Medicare Other

## 2016-05-02 ENCOUNTER — Ambulatory Visit (INDEPENDENT_AMBULATORY_CARE_PROVIDER_SITE_OTHER): Payer: Medicare Other

## 2016-05-02 DIAGNOSIS — Z5181 Encounter for therapeutic drug level monitoring: Secondary | ICD-10-CM | POA: Diagnosis not present

## 2016-05-02 DIAGNOSIS — I482 Chronic atrial fibrillation, unspecified: Secondary | ICD-10-CM

## 2016-05-02 DIAGNOSIS — I4891 Unspecified atrial fibrillation: Secondary | ICD-10-CM

## 2016-05-02 LAB — POCT INR: INR: 2.3

## 2016-05-14 ENCOUNTER — Encounter: Payer: Self-pay | Admitting: Internal Medicine

## 2016-05-14 ENCOUNTER — Encounter (INDEPENDENT_AMBULATORY_CARE_PROVIDER_SITE_OTHER): Payer: Self-pay

## 2016-05-14 ENCOUNTER — Ambulatory Visit (INDEPENDENT_AMBULATORY_CARE_PROVIDER_SITE_OTHER): Payer: Medicare Other | Admitting: Internal Medicine

## 2016-05-14 VITALS — BP 112/78 | HR 57 | Ht 71.0 in | Wt 196.6 lb

## 2016-05-14 DIAGNOSIS — Z79899 Other long term (current) drug therapy: Secondary | ICD-10-CM

## 2016-05-14 DIAGNOSIS — I482 Chronic atrial fibrillation, unspecified: Secondary | ICD-10-CM

## 2016-05-14 MED ORDER — ROSUVASTATIN CALCIUM 5 MG PO TABS: 5.0000 mg | ORAL_TABLET | Freq: Every day | ORAL | 6 refills | Status: DC

## 2016-05-14 NOTE — Progress Notes (Signed)
HPI Mr. Gist returns today for followup. He is a 76 year old man with a history of chronic atrial fibrillation, rate controlled, borderline hypertension, and arthritis. He remains active, and denies chest pain, or syncope. He now clearly has HTN. He is on 3 meds and his blood pressure is high. He has experienced some fatigue and dyspnea with exertion. He has undergone stress testing where he states he was stopped prematurely and eventually underwent lexiscan myoview which demonstrated no ischemia and was thought to represent a low risk study. No Known Allergies   Current Outpatient Prescriptions  Medication Sig Dispense Refill  . amLODipine (NORVASC) 5 MG tablet Take 1 tablet (5 mg total) by mouth daily. 90 tablet 3  . amoxicillin (AMOXIL) 500 MG capsule Take 4 caps by mouth 1 hour prior to dental procedure    . aspirin EC 81 MG tablet Take 81 mg by mouth daily.    . hydrochlorothiazide (MICROZIDE) 12.5 MG capsule Take 1 capsule by mouth daily.    Marland Kitchen losartan (COZAAR) 100 MG tablet TAKE 1 TABLET BY MOUTH DAILY 30 tablet 10  . warfarin (COUMADIN) 5 MG tablet TAKE 1 TABLET BY MOUTH AS DIRECTED BY COUMADIN CLINIC *NOTE 45 TABLETS IS 1 MONTH SUPPLY PER MD* 45 tablet 3   No current facility-administered medications for this visit.      Past Medical History:  Diagnosis Date  . Atrial fibrillation ([place])    takes aspirin for this  . Brachial plexus injury 1995   Left arm  . HTN (hypertension)    6 years  . Injury of brachial plexus   . Prostate cancer ([place])   . Prostate cancer ([place])   . Torn rotator cuff   . Torn rotator cuff 2000   Right arm    ROS:   All systems reviewed and negative except as noted in the HPI.   Past Surgical History:  Procedure Laterality Date  . ETT  2006   High fitness but bp of 255  . Int fixation left forearm  01/19/1989  . KNEE SURGERY     left  . PROSTATECTOMY    . removal of basal cell       Family History  Problem Relation Age of Onset  . Heart  attack Father     MI  . Obesity Brother   . Parkinsonism Brother   . Prostate cancer Brother   . Colon cancer Neg Hx   . Esophageal cancer Neg Hx   . Stomach cancer Neg Hx   . Rectal cancer Neg Hx      Social History   Social History  . Marital status: Married    Spouse name: N/A  . Number of children: N/A  . Years of education: N/A   Occupational History  . Realtor Retired   Social History Main Topics  . Smoking status: Former Smoker    Quit date: 04/21/1985  . Smokeless tobacco: Never Used  . Alcohol use Yes     Comment: Social  . Drug use: No  . Sexual activity: Not on file   Other Topics Concern  . 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 HEART:  IRegular rate rhythm, no murmurs, no rubs, no clicks Abd:  soft, positive bowel sounds, no organomegally, no rebound, no guarding Ext:  2 plus pulses, no edema, no cyanosis, no clubbing Skin:  No rashes no nodules Neuro:  CN II through XII intact, motor grossly intact  EKG - atrial fibrillation with controlled ventricular response  Assess/Plan: 1. Atrial fib - his rate is controlled. No change in his meds. 2. HTN - his blood pressure is better on amlodipine 5 mg daily. 3. Sob - we discussed this some more. He is encouraged to increase his physical activity 4. Coags - his has tolerated his warfarin. We discussed NOAC's but he wishes to continue warfarin for now. 5. Cholesterol/prevention - we discussed his long  term risk, and the pathophysiology of acute MI. He is willing to start low dose crestor. Will check fasting lipids when he returns.  Mikle Bosworth.D.

## 2016-05-14 NOTE — Patient Instructions (Addendum)
Medication Instructions:  Your physician has recommended you make the following change in your medication:  1) START Crestor 5 mg daily    Labwork: In 2 months  --- Liver Panel and Fasting Lipid (nothing to eat 8-12 hours prior to lab draw. May drink water or black coffee (no cream or sugar)    Testing/Procedures: None Ordered   Follow-Up: Your physician wants you to follow-up in: 4 months with Dr. Lovena Le. You will receive a reminder letter in the mail two months in advance. If you don't receive a letter, please call our office to schedule the follow-up appointment.     Any Other Special Instructions Will Be Listed Below (If Applicable).     If you need a refill on your cardiac medications before your next appointment, please call your pharmacy.

## 2016-06-06 ENCOUNTER — Ambulatory Visit (INDEPENDENT_AMBULATORY_CARE_PROVIDER_SITE_OTHER): Payer: Medicare Other | Admitting: Pharmacist

## 2016-06-06 DIAGNOSIS — Z5181 Encounter for therapeutic drug level monitoring: Secondary | ICD-10-CM | POA: Diagnosis not present

## 2016-06-06 DIAGNOSIS — I482 Chronic atrial fibrillation, unspecified: Secondary | ICD-10-CM

## 2016-06-06 LAB — POCT INR: INR: 2.8

## 2016-06-06 MED ORDER — WARFARIN SODIUM 5 MG PO TABS: ORAL_TABLET | ORAL | 3 refills | Status: DC

## 2016-07-02 DIAGNOSIS — J069 Acute upper respiratory infection, unspecified: Secondary | ICD-10-CM | POA: Diagnosis not present

## 2016-07-08 ENCOUNTER — Other Ambulatory Visit: Payer: Medicare Other | Admitting: *Deleted

## 2016-07-08 DIAGNOSIS — I480 Paroxysmal atrial fibrillation: Secondary | ICD-10-CM | POA: Diagnosis not present

## 2016-07-08 DIAGNOSIS — I10 Essential (primary) hypertension: Secondary | ICD-10-CM

## 2016-07-08 LAB — HEPATIC FUNCTION PANEL
ALT: 27 IU/L (ref 0–44)
AST: 26 IU/L (ref 0–40)
Albumin: 4 g/dL (ref 3.5–4.8)
Alkaline Phosphatase: 70 IU/L (ref 39–117)
Bilirubin Total: 0.7 mg/dL (ref 0.0–1.2)
Bilirubin, Direct: 0.19 mg/dL (ref 0.00–0.40)
Total Protein: 6.4 g/dL (ref 6.0–8.5)

## 2016-07-08 LAB — LIPID PANEL
CHOLESTEROL TOTAL: 170 mg/dL (ref 100–199)
Chol/HDL Ratio: 2.8 ratio units (ref 0.0–5.0)
HDL: 61 mg/dL (ref >39)
LDL Calculated: 101 mg/dL — ABNORMAL HIGH (ref 0–99)
TRIGLYCERIDES: 42 mg/dL (ref 0–149)
VLDL Cholesterol Cal: 8 mg/dL (ref 5–40)

## 2016-07-08 NOTE — Addendum Note (Signed)
Addended by: Eulis Foster on: 07/08/2016 08:42 AM   Modules accepted: Orders

## 2016-07-18 ENCOUNTER — Ambulatory Visit (INDEPENDENT_AMBULATORY_CARE_PROVIDER_SITE_OTHER): Payer: Medicare Other | Admitting: *Deleted

## 2016-07-18 ENCOUNTER — Telehealth: Payer: Self-pay | Admitting: *Deleted

## 2016-07-18 DIAGNOSIS — I482 Chronic atrial fibrillation, unspecified: Secondary | ICD-10-CM

## 2016-07-18 DIAGNOSIS — Z5181 Encounter for therapeutic drug level monitoring: Secondary | ICD-10-CM | POA: Diagnosis not present

## 2016-07-18 LAB — POCT INR: INR: 2.8

## 2016-07-18 NOTE — Telephone Encounter (Signed)
Received request to start Lipitor 10 mg instead of generic Crestor.  (generic Crestor will cost pt $47/mo w/ insurance) Will forward to Dr. Lovena Le for approval to start Lipitor instead of Crestor.

## 2016-07-22 MED ORDER — ATORVASTATIN CALCIUM 10 MG PO TABS
10.0000 mg | ORAL_TABLET | Freq: Every day | ORAL | 3 refills | Status: DC
Start: 2016-07-22 — End: 2017-07-01

## 2016-07-22 NOTE — Telephone Encounter (Addendum)
Per Dr. Lovena Le:  Madaline Brilliant to switch rx to Atorvastatin 10 mg once daily. New rx sent to requesting pharmacy - [facility] Pt notified.

## 2016-08-14 DIAGNOSIS — R238 Other skin changes: Secondary | ICD-10-CM | POA: Diagnosis not present

## 2016-08-14 DIAGNOSIS — M170 Bilateral primary osteoarthritis of knee: Secondary | ICD-10-CM | POA: Diagnosis not present

## 2016-08-14 DIAGNOSIS — E7800 Pure hypercholesterolemia, unspecified: Secondary | ICD-10-CM | POA: Diagnosis not present

## 2016-08-14 DIAGNOSIS — I4891 Unspecified atrial fibrillation: Secondary | ICD-10-CM | POA: Diagnosis not present

## 2016-08-14 DIAGNOSIS — I10 Essential (primary) hypertension: Secondary | ICD-10-CM | POA: Diagnosis not present

## 2016-08-29 ENCOUNTER — Encounter (INDEPENDENT_AMBULATORY_CARE_PROVIDER_SITE_OTHER): Payer: Self-pay

## 2016-08-29 ENCOUNTER — Ambulatory Visit (INDEPENDENT_AMBULATORY_CARE_PROVIDER_SITE_OTHER): Payer: Medicare Other

## 2016-08-29 DIAGNOSIS — I4891 Unspecified atrial fibrillation: Secondary | ICD-10-CM | POA: Diagnosis not present

## 2016-08-29 DIAGNOSIS — Z5181 Encounter for therapeutic drug level monitoring: Secondary | ICD-10-CM

## 2016-08-29 DIAGNOSIS — I482 Chronic atrial fibrillation, unspecified: Secondary | ICD-10-CM

## 2016-08-29 LAB — POCT INR: INR: 3.2

## 2016-10-10 ENCOUNTER — Ambulatory Visit (INDEPENDENT_AMBULATORY_CARE_PROVIDER_SITE_OTHER): Payer: Medicare Other

## 2016-10-10 DIAGNOSIS — I4891 Unspecified atrial fibrillation: Secondary | ICD-10-CM | POA: Diagnosis not present

## 2016-10-10 DIAGNOSIS — I482 Chronic atrial fibrillation, unspecified: Secondary | ICD-10-CM

## 2016-10-10 DIAGNOSIS — Z5181 Encounter for therapeutic drug level monitoring: Secondary | ICD-10-CM

## 2016-10-10 LAB — POCT INR: INR: 3.6

## 2016-10-29 DIAGNOSIS — D692 Other nonthrombocytopenic purpura: Secondary | ICD-10-CM | POA: Diagnosis not present

## 2016-10-29 DIAGNOSIS — D2372 Other benign neoplasm of skin of left lower limb, including hip: Secondary | ICD-10-CM | POA: Diagnosis not present

## 2016-10-29 DIAGNOSIS — D2362 Other benign neoplasm of skin of left upper limb, including shoulder: Secondary | ICD-10-CM | POA: Diagnosis not present

## 2016-10-29 DIAGNOSIS — L821 Other seborrheic keratosis: Secondary | ICD-10-CM | POA: Diagnosis not present

## 2016-10-29 DIAGNOSIS — D1801 Hemangioma of skin and subcutaneous tissue: Secondary | ICD-10-CM | POA: Diagnosis not present

## 2016-10-29 DIAGNOSIS — L814 Other melanin hyperpigmentation: Secondary | ICD-10-CM | POA: Diagnosis not present

## 2016-10-29 DIAGNOSIS — L738 Other specified follicular disorders: Secondary | ICD-10-CM | POA: Diagnosis not present

## 2016-11-05 ENCOUNTER — Ambulatory Visit (INDEPENDENT_AMBULATORY_CARE_PROVIDER_SITE_OTHER): Payer: Medicare Other | Admitting: Pharmacist

## 2016-11-05 DIAGNOSIS — I4891 Unspecified atrial fibrillation: Secondary | ICD-10-CM

## 2016-11-05 DIAGNOSIS — I482 Chronic atrial fibrillation, unspecified: Secondary | ICD-10-CM

## 2016-11-05 DIAGNOSIS — Z5181 Encounter for therapeutic drug level monitoring: Secondary | ICD-10-CM | POA: Diagnosis not present

## 2016-11-05 LAB — POCT INR: INR: 2

## 2016-11-05 MED ORDER — WARFARIN SODIUM 5 MG PO TABS: ORAL_TABLET | ORAL | 3 refills | Status: DC

## 2016-12-03 ENCOUNTER — Telehealth: Payer: Self-pay | Admitting: Internal Medicine

## 2016-12-03 NOTE — Telephone Encounter (Signed)
Return call received from Pt.  Per Pt he has noticed intermittent swelling of his lower legs- thinks it may be related to losartan.  Pt states his left lower leg appears more swollen than his right leg.  It feels tight.  There is no pain, redness or warmth.  Pt states it does seem a little discolored.  Pt states he has not been eating a lot of salt and has had feet elevated often since he is on vacation.  Pt takes HCTZ 12.5 mg daily.  Notified Pt to continue to elevate legs and avoid salt.  I will call Pt Friday to see if any improvement.

## 2016-12-03 NOTE — Telephone Encounter (Signed)
New message    Pt c/o swelling: STAT is pt has developed SOB within 24 hours  1. How long have you been experiencing swelling? Last 5 days   2. Where is the swelling located?ankles   3.  Are you currently taking a "fluid pill"? Yes   4.  Are you currently SOB?  no  5.  Have you traveled recently? Yes he on vacation , but it is not a result to that , this started when they changed his medication

## 2016-12-03 NOTE — Telephone Encounter (Signed)
Return call made to Pt.  Call went to VM.  Left this nurse name and # for call back.

## 2016-12-05 NOTE — Telephone Encounter (Signed)
Attempted to call Pt.  Call went directly to VM.  Left message requesting call back.  This nurse name and # left for call back.  Pt will need appt with Dr. Lovena Le.

## 2016-12-05 NOTE — Telephone Encounter (Signed)
Call back received from Pt.  Per Pt he had doubled his dose of HCTZ last 2 days with some improvement in his edema.  Pt states he will continuing taking HCTZ 25 mg until he hears back from this nurse.  Will discuss with Dr. Lovena Le and f/u.  Pt with appt 12/17/2016.

## 2016-12-09 ENCOUNTER — Ambulatory Visit: Payer: Medicare Other | Admitting: Internal Medicine

## 2016-12-09 MED ORDER — HYDROCHLOROTHIAZIDE 25 MG PO TABS: 25.0000 mg | ORAL_TABLET | Freq: Every day | ORAL | 0 refills | Status: DC

## 2016-12-09 NOTE — Telephone Encounter (Signed)
Spoke with Dr. Lovena Le.  OK to continue HCTZ 25 mg daily until Pt visit on 12/17/2016.  Order entered.

## 2016-12-17 ENCOUNTER — Encounter: Payer: Self-pay | Admitting: Internal Medicine

## 2016-12-17 ENCOUNTER — Encounter (INDEPENDENT_AMBULATORY_CARE_PROVIDER_SITE_OTHER): Payer: Self-pay

## 2016-12-17 ENCOUNTER — Ambulatory Visit (INDEPENDENT_AMBULATORY_CARE_PROVIDER_SITE_OTHER): Payer: Medicare Other | Admitting: Internal Medicine

## 2016-12-17 VITALS — BP 140/82 | HR 69 | Ht 71.0 in | Wt 192.0 lb

## 2016-12-17 DIAGNOSIS — I482 Chronic atrial fibrillation, unspecified: Secondary | ICD-10-CM

## 2016-12-17 DIAGNOSIS — I10 Essential (primary) hypertension: Secondary | ICD-10-CM

## 2016-12-17 DIAGNOSIS — Z5181 Encounter for therapeutic drug level monitoring: Secondary | ICD-10-CM

## 2016-12-17 NOTE — Progress Notes (Signed)
HPI Mr. Beever returns today for followup. He is a very pleasant 76 year old man with chronic atrial fibrillation, hypertension, on chronic systemic anticoagulation, who also has conduction system disease as evidenced by his rate controlled A. fib on no AV nodal blocking drugs. He also has chronic peripheral edema. His blood pressure has been increased. He has started wearing support stockings and taken a whole HCTZ and his swelling has improved. No other complaints today. No Known Allergies   Current Outpatient Prescriptions  Medication Sig Dispense Refill  . amLODipine (NORVASC) 5 MG tablet Take 1 tablet (5 mg total) by mouth daily. 90 tablet 3  . amoxicillin (AMOXIL) 500 MG capsule Take 4 caps by mouth 1 hour prior to dental procedure    . aspirin EC 81 MG tablet Take 81 mg by mouth daily.    Marland Kitchen atorvastatin (LIPITOR) 10 MG tablet Take 1 tablet (10 mg total) by mouth daily. 90 tablet 3  . hydrochlorothiazide (HYDRODIURIL) 25 MG tablet Take 1 tablet (25 mg total) by mouth daily. 90 tablet 0  . losartan (COZAAR) 100 MG tablet TAKE 1 TABLET BY MOUTH DAILY 30 tablet 10  . warfarin (COUMADIN) 5 MG tablet TAKE 1 TABLET BY MOUTH AS DIRECTED BY COUMADIN CLINIC *NOTE 45 TABLETS IS 1 MONTH SUPPLY PER MD* 45 tablet 3   No current facility-administered medications for this visit.      Past Medical History:  Diagnosis Date  . Atrial fibrillation ([place])    takes aspirin for this  . Brachial plexus injury 1995   Left arm  . HTN (hypertension)    6 years  . Injury of brachial plexus   . Prostate cancer ([place])   . Prostate cancer ([place])   . Torn rotator cuff   . Torn rotator cuff 2000   Right arm    ROS:   All systems reviewed and negative except as noted in the HPI.   Past Surgical History:  Procedure Laterality Date  . ETT  2006   High fitness but bp of 255  . Int fixation left forearm  01/19/1989  . KNEE SURGERY     left  . PROSTATECTOMY    . removal of basal cell        Family History  Problem Relation Age of Onset  . Heart attack Father        MI  . Obesity Brother   . Parkinsonism Brother   . Prostate cancer Brother   . Colon cancer Neg Hx   . Esophageal cancer Neg Hx   . Stomach cancer Neg Hx   . Rectal cancer Neg Hx      Social History   Social History  . Marital status: Married    Spouse name: N/A  . Number of children: N/A  . Years of education: N/A   Occupational History  . Realtor Retired   Social History Main Topics  . Smoking status: Former Smoker    Quit date: 04/21/1985  . Smokeless tobacco: Never Used  . Alcohol use Yes     Comment: Social  . Drug use: No  . Sexual activity: Not on file   Other Topics Concern  . 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   married to [place]; 2 male and 1 male children;    multiple grandkids;    non smoker - quit  25 years ago   social ETOH     BP 140/82   Pulse 69   Ht 5\' 11"  (1.803 m)   Wt 192 lb (87.1 kg)   SpO2 97%   BMI 26.78 kg/m   Physical Exam:  Well appearing NAD HEENT: Unremarkable Neck:  No JVD, no thyromegally Lymphatics:  No adenopathy Back:  No CVA tenderness Lungs:  Clear HEART:  Regular rate rhythm, no murmurs, no rubs, no clicks Abd:  soft, positive bowel sounds, no organomegally, no rebound, no guarding Ext:  2 plus pulses, no edema, no cyanosis, no clubbing Skin:  No rashes no nodules Neuro:  CN II through XII intact, motor grossly intact  EKG - atrial fib with a CVR   Assess/Plan: 1. Atrial fib - his rate is well controlled and he is not quite as slow today as he has been. Will hold off on using a beta blocker for now but would consider in the future. 2. Peripheral edema - he has evidence of venous insufficiency. He has started wearing support stockings and taking 25 of HCTZ and is doing better. We discussed stopping the amlodipine and switching to  coreg for blood pressure but he does not want to stop at this point. 3. HTN - his blood pressure is a bit elevated. I have recommended he watch his salt intake, and continue his current meds. If his pressure goes up, we could try switching to coreg. 4. Dyslipidemia - he has started atorvastatin. I will check fasting lipids and a liver panel in a few weeks.  Mikle Bosworth.D.

## 2016-12-17 NOTE — Patient Instructions (Addendum)
Medication Instructions:  Your physician recommends that you continue on your current medications as directed. Please refer to the Current Medication list given to you today.  Labwork: Get a fasting lipid panel and liver panel in September.   Testing/Procedures: None ordered.  Follow-Up: Your physician wants you to follow-up in: 6 months with Dr. Lovena Le.   You will receive a reminder letter in the mail two months in advance. If you don't receive a letter, please call our office to schedule the follow-up appointment.   Any Other Special Instructions Will Be Listed Below (If Applicable).   If you need a refill on your cardiac medications before your next appointment, please call your pharmacy.

## 2017-01-19 ENCOUNTER — Other Ambulatory Visit: Payer: Self-pay | Admitting: Internal Medicine

## 2017-02-11 ENCOUNTER — Other Ambulatory Visit: Payer: Self-pay | Admitting: Internal Medicine

## 2017-02-24 DIAGNOSIS — I480 Paroxysmal atrial fibrillation: Secondary | ICD-10-CM | POA: Diagnosis not present

## 2017-02-24 DIAGNOSIS — Z23 Encounter for immunization: Secondary | ICD-10-CM | POA: Diagnosis not present

## 2017-02-24 DIAGNOSIS — Z0000 Encounter for general adult medical examination without abnormal findings: Secondary | ICD-10-CM | POA: Diagnosis not present

## 2017-02-24 DIAGNOSIS — I10 Essential (primary) hypertension: Secondary | ICD-10-CM | POA: Diagnosis not present

## 2017-02-24 DIAGNOSIS — Z125 Encounter for screening for malignant neoplasm of prostate: Secondary | ICD-10-CM | POA: Diagnosis not present

## 2017-02-24 DIAGNOSIS — E7800 Pure hypercholesterolemia, unspecified: Secondary | ICD-10-CM | POA: Diagnosis not present

## 2017-03-09 ENCOUNTER — Other Ambulatory Visit: Payer: Self-pay | Admitting: Internal Medicine

## 2017-03-10 IMAGING — NM NM MISC PROCEDURE
3 series · 18 of 18 positions shown · non-contrast
Comparison: none

[Series 1: wbr_s-proj_st stress_(id)_sa · 6.5mm · 6.51mm/px · 6 of 64 frames shown (1 of 2)]
[frame 6/64]
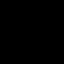
[frame 16/64]
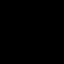
[frame 27/64]
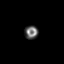
[frame 38/64]
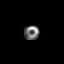
[frame 48/64]
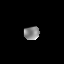
[frame 59/64]
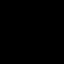

[Series 1: wbr_r-proj_st rest_(id)_sa · 6.5mm · 6.51mm/px · 6 of 64 frames shown]
[frame 6/64]
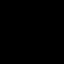
[frame 16/64]
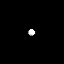
[frame 27/64]
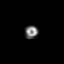
[frame 38/64]
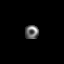
[frame 48/64]
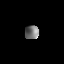
[frame 59/64]
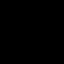

[Series 1: wbr_s-proj_st stress_(id)_sa · 6.5mm · 6.51mm/px · 6 of 512 frames shown (2 of 2)]
[frame 43/512]
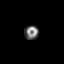
[frame 128/512]
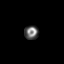
[frame 214/512]
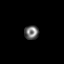
[frame 299/512]
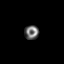
[frame 384/512]
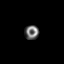
[frame 470/512]
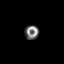

[18 of 18 positions shown; findings below may reference images not displayed]

Canned report from images found in remote index.

Refer to host system for actual result text.

## 2017-04-02 ENCOUNTER — Other Ambulatory Visit: Payer: Self-pay | Admitting: Internal Medicine

## 2017-04-08 ENCOUNTER — Other Ambulatory Visit: Payer: Self-pay | Admitting: *Deleted

## 2017-04-08 MED ORDER — WARFARIN SODIUM 5 MG PO TABS: ORAL_TABLET | ORAL | 3 refills | Status: DC

## 2017-04-22 DIAGNOSIS — M25561 Pain in right knee: Secondary | ICD-10-CM | POA: Insufficient documentation

## 2017-04-22 DIAGNOSIS — M1711 Unilateral primary osteoarthritis, right knee: Secondary | ICD-10-CM | POA: Diagnosis not present

## 2017-04-22 DIAGNOSIS — M179 Osteoarthritis of knee, unspecified: Secondary | ICD-10-CM | POA: Insufficient documentation

## 2017-04-22 DIAGNOSIS — Z96652 Presence of left artificial knee joint: Secondary | ICD-10-CM | POA: Diagnosis not present

## 2017-04-22 DIAGNOSIS — M1710 Unilateral primary osteoarthritis, unspecified knee: Secondary | ICD-10-CM | POA: Insufficient documentation

## 2017-04-22 DIAGNOSIS — M25562 Pain in left knee: Secondary | ICD-10-CM

## 2017-04-23 ENCOUNTER — Ambulatory Visit (INDEPENDENT_AMBULATORY_CARE_PROVIDER_SITE_OTHER): Payer: PPO | Admitting: *Deleted

## 2017-04-23 DIAGNOSIS — Z5181 Encounter for therapeutic drug level monitoring: Secondary | ICD-10-CM | POA: Diagnosis not present

## 2017-04-23 DIAGNOSIS — I4891 Unspecified atrial fibrillation: Secondary | ICD-10-CM | POA: Diagnosis not present

## 2017-04-23 LAB — POCT INR: INR: 1.9

## 2017-04-23 NOTE — Patient Instructions (Signed)
Description   Today Jan 3rd take another 1/2 tablet of coumadin as he has taken 1 tablet already today making a total for today of 7.5mg  then continue 1 tablet daily except 1.5 tablets on Wednesdays and Saturdays.  Recheck in 2 weeks   Call . [facility] [phone] Main # [phone] with any new medications questions or if scheduled for any procedures

## 2017-05-07 ENCOUNTER — Ambulatory Visit (INDEPENDENT_AMBULATORY_CARE_PROVIDER_SITE_OTHER): Payer: PPO

## 2017-05-07 DIAGNOSIS — Z5181 Encounter for therapeutic drug level monitoring: Secondary | ICD-10-CM | POA: Diagnosis not present

## 2017-05-07 DIAGNOSIS — I4891 Unspecified atrial fibrillation: Secondary | ICD-10-CM | POA: Diagnosis not present

## 2017-05-07 LAB — POCT INR: INR: 2.6

## 2017-05-07 NOTE — Patient Instructions (Signed)
Description   Continue on same dosage 1 tablet daily except 1.5 tablets on Wednesdays and Saturdays.  Recheck in 4 weeks.   Call [facility] [phone] Main # [phone] with any new medications questions or if scheduled for any procedures

## 2017-06-03 DIAGNOSIS — M1711 Unilateral primary osteoarthritis, right knee: Secondary | ICD-10-CM | POA: Diagnosis not present

## 2017-06-03 DIAGNOSIS — M25561 Pain in right knee: Secondary | ICD-10-CM | POA: Diagnosis not present

## 2017-06-03 DIAGNOSIS — Z96652 Presence of left artificial knee joint: Secondary | ICD-10-CM | POA: Diagnosis not present

## 2017-06-04 ENCOUNTER — Ambulatory Visit (INDEPENDENT_AMBULATORY_CARE_PROVIDER_SITE_OTHER): Payer: PPO | Admitting: *Deleted

## 2017-06-04 DIAGNOSIS — Z5181 Encounter for therapeutic drug level monitoring: Secondary | ICD-10-CM

## 2017-06-04 DIAGNOSIS — I4891 Unspecified atrial fibrillation: Secondary | ICD-10-CM | POA: Diagnosis not present

## 2017-06-04 LAB — POCT INR: INR: 1.7

## 2017-06-04 NOTE — Patient Instructions (Signed)
Description   Today Feb 14th take another 1/2 tablet of coumadin as he has already taken 1 tablet today then continue on same dosage 1 tablet daily except 1 and 1/2 tablets on Wednesdays and Saturdays.  Recheck in 2 weeks.   Call [facility] Clinic [phone] Main # [phone] with any new medications questions or if scheduled for any procedures

## 2017-06-18 ENCOUNTER — Ambulatory Visit (INDEPENDENT_AMBULATORY_CARE_PROVIDER_SITE_OTHER): Payer: PPO

## 2017-06-18 DIAGNOSIS — I4891 Unspecified atrial fibrillation: Secondary | ICD-10-CM | POA: Diagnosis not present

## 2017-06-18 DIAGNOSIS — Z5181 Encounter for therapeutic drug level monitoring: Secondary | ICD-10-CM | POA: Diagnosis not present

## 2017-06-18 LAB — POCT INR: INR: 2.1

## 2017-06-18 NOTE — Patient Instructions (Signed)
Description   Continue on same dosage 1 tablet daily except 1.5 tablets on Wednesdays and Saturdays.  Recheck in 3 weeks.   Call [facility] [phone] Main # [phone] with any new medications questions or if scheduled for any procedures

## 2017-07-01 ENCOUNTER — Other Ambulatory Visit: Payer: Self-pay | Admitting: Internal Medicine

## 2017-07-08 DIAGNOSIS — M1711 Unilateral primary osteoarthritis, right knee: Secondary | ICD-10-CM | POA: Diagnosis not present

## 2017-07-08 DIAGNOSIS — M25561 Pain in right knee: Secondary | ICD-10-CM | POA: Diagnosis not present

## 2017-07-09 ENCOUNTER — Ambulatory Visit: Payer: PPO | Admitting: *Deleted

## 2017-07-09 DIAGNOSIS — Z5181 Encounter for therapeutic drug level monitoring: Secondary | ICD-10-CM | POA: Diagnosis not present

## 2017-07-09 DIAGNOSIS — I4891 Unspecified atrial fibrillation: Secondary | ICD-10-CM | POA: Diagnosis not present

## 2017-07-09 LAB — POCT INR: INR: 2.7

## 2017-07-09 NOTE — Patient Instructions (Signed)
Description   Continue on same dosage 1 tablet daily except 1.5 tablets on Wednesdays and Saturdays.  Recheck in 4 weeks.   Call [facility] Clinic [phone] Main # [phone] with any new medications questions or if scheduled for any procedures

## 2017-07-21 ENCOUNTER — Ambulatory Visit: Payer: PPO | Admitting: Internal Medicine

## 2017-07-21 ENCOUNTER — Encounter: Payer: Self-pay | Admitting: Internal Medicine

## 2017-07-21 VITALS — BP 132/74 | HR 67 | Ht 71.0 in | Wt 197.0 lb

## 2017-07-21 DIAGNOSIS — I10 Essential (primary) hypertension: Secondary | ICD-10-CM | POA: Diagnosis not present

## 2017-07-21 DIAGNOSIS — I482 Chronic atrial fibrillation, unspecified: Secondary | ICD-10-CM

## 2017-07-21 MED ORDER — LISINOPRIL 20 MG PO TABS: 20.0000 mg | ORAL_TABLET | Freq: Every day | ORAL | 3 refills | Status: DC

## 2017-07-21 MED ORDER — APIXABAN 5 MG PO TABS: 5.0000 mg | ORAL_TABLET | Freq: Two times a day (BID) | ORAL | 11 refills | Status: DC

## 2017-07-21 NOTE — Progress Notes (Addendum)
HPI Glenn [name] returns today for followup. He is a very pleasant 77 year old man with chronic atrial fibrillation, hypertension, on chronic systemic anticoagulation, who also has conduction system disease as evidenced by his rate controlled A. fib on no AV nodal blocking drugs. He also has chronic peripheral edema. He started wearing support stockings and taken a whole HCTZ and his swelling has improved. No other complaints today. He has enquired about switch to an [place]. No syncope.  No Known Allergies   Current Outpatient Medications  Medication Sig Dispense Refill  . amLODipine (NORVASC) 5 MG tablet TAKE ONE TABLET BY MOUTH DAILY 90 tablet 2  . amoxicillin (AMOXIL) 500 MG capsule Take 4 caps by mouth 1 hour prior to dental procedure    . aspirin EC 81 MG tablet Take 81 mg by mouth daily.    Marland Kitchen atorvastatin (LIPITOR) 10 MG tablet TAKE ONE TABLET BY MOUTH DAILY 90 tablet 1  . hydrochlorothiazide (HYDRODIURIL) 25 MG tablet TAKE ONE TABLET BY MOUTH DAILY 90 tablet 2  . losartan (COZAAR) 100 MG tablet TAKE 1 TABLET BY MOUTH DAILY 90 tablet 3  . warfarin (COUMADIN) 5 MG tablet TAKE 1 TABLET BY MOUTH AS DIRECTED BY COUMADIN CLINIC  **45 TABLETS IS 1 MONTH SUPPLY** 45 tablet 0   No current facility-administered medications for this visit.      Past Medical History:  Diagnosis Date  . Atrial fibrillation ([place])    takes aspirin for this  . Brachial plexus injury 1995   Left arm  . HTN (hypertension)    6 years  . Injury of brachial plexus   . Prostate cancer ([place])   . Prostate cancer ([place])   . Torn rotator cuff   . Torn rotator cuff 2000   Right arm    ROS:   All systems reviewed and negative except as noted in the HPI.   Past Surgical History:  Procedure Laterality Date  . ETT  2006   High fitness but bp of 255  . Int fixation left forearm  01/19/1989  . KNEE SURGERY     left  . PROSTATECTOMY    . removal of basal cell       Family History  Problem Relation Age of  Onset  . Heart attack Father        MI  . Obesity Brother   . Parkinsonism Brother   . Prostate cancer Brother   . Colon cancer Neg Hx   . Esophageal cancer Neg Hx   . Stomach cancer Neg Hx   . Rectal cancer Neg Hx      Social History   Socioeconomic History  . Marital status: Married    Spouse name: Not on file  . Number of children: Not on file  . Years of education: Not on file  . Highest education level: Not on file  Occupational History  . 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 Financial resource strain: Not on file  . Food insecurity:    Worry: Not on file    Inability: Not on file  . Transportation needs:    Medical: Not on file    Non-medical: Not on file  Tobacco Use  . Smoking status: Former Smoker    Last attempt to quit: 04/21/1985    Years since quitting: 32.2  . Smokeless tobacco: Never Used  Substance and Sexual Activity  . Alcohol use: Yes    Comment: Social  .  Drug use: No  . Sexual activity: Not on file  Lifestyle  . Physical activity:    Days per week: Not on file    Minutes per session: Not on file  . Stress: Not on file  Relationships  . Social connections:    Talks on phone: Not on file    Gets together: Not on file    Attends religious service: Not on file    Active member of club or organization: Not on file    Attends meetings of clubs or organizations: Not on file    Relationship status: Not on file  . Intimate partner violence:    Fear of current or ex partner: Not on file    Emotionally abused: Not on file    Physically abused: Not on file    Forced sexual activity: Not on file  Other Topics Concern  . 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[employer] after 2 final fours   married to [place]; 2 male and 1 male children;    multiple grandkids;    non smoker - quit 25 years ago   social ETOH     There were no vitals taken for this  visit.  Physical Exam:  Well appearing 77 yo man, NAD HEENT: Unremarkable Neck:  6 cm JVD, no thyromegally Lymphatics:  No adenopathy Back:  No CVA tenderness Lungs:  Clear with no wheezes HEART:  Iregular rate rhythm, no murmurs, no rubs, no clicks Abd:  soft, positive bowel sounds, no organomegally, no rebound, no guarding Ext:  2 plus pulses, 1+ edema on left trace on right, no cyanosis, no clubbing Skin:  No rashes no nodules Neuro:  CN II through XII intact, motor grossly intact  EKG - atrial fib with a controlled VR   Assess/Plan: 1. Atrial fib - the patient's ventricular rate is well controlled. He will continue his current meds. 2. Coags - we discussed the new atrial fib guidelines and he would like to switch to an [place]. After discussing the pro's and cons of all of the oac's, he will start Eliquis. 3. HTN - he will stop losartan due to the recall. He will switch to an ACE inhibitor. 4. Leg swelling - I suspect that this is due to amlodipine. He will stop this. We may require additional HTN meds.  Glenn [name].D.

## 2017-07-21 NOTE — Patient Instructions (Addendum)
Medication Instructions:  Your physician has recommended you make the following change in your medication:  1.  Stop taking losartan 2.  Stop taking amlodipine 3.  Start taking lisinopril 20 mg one tablet by mouth daily  4.  Stop taking warfarin.  Wait one day.  Then start taking.... 5.  Eliquis 5 mg one tablet by mouth twice a day.  Labwork: You will return in 2 weeks for a BMP (same day as your blood pressure check)  Testing/Procedures: You will return in 2 weeks for a blood pressure check.  Please schedule with pharmacy.  Follow-Up: Your physician wants you to follow-up in: 6 months with Dr. Lovena Le.      Any Other Special Instructions Will Be Listed Below (If Applicable).  If you need a refill on your cardiac medications before your next appointment, please call your pharmacy.  Apixaban oral tablets What is this medicine? APIXABAN (a PIX a ban) is an anticoagulant (blood thinner). It is used to lower the chance of stroke in people with a medical condition called atrial fibrillation. It is also used to treat or prevent blood clots in the lungs or in the veins. This medicine may be used for other purposes; ask your health care provider or pharmacist if you have questions. COMMON BRAND NAME(S): Eliquis What should I tell my health care provider before I take this medicine? They need to know if you have any of these conditions: -bleeding disorders -bleeding in the brain -blood in your stools (black or tarry stools) or if you have blood in your vomit -history of stomach bleeding -kidney disease -liver disease -mechanical heart valve -an unusual or allergic reaction to apixaban, other medicines, foods, dyes, or preservatives -pregnant or trying to get pregnant -breast-feeding How should I use this medicine? Take this medicine by mouth with a glass of water. Follow the directions on the prescription label. You can take it with or without food. If it upsets your stomach, take it  with food. Take your medicine at regular intervals. Do not take it more often than directed. Do not stop taking except on your doctor's advice. Stopping this medicine may increase your risk of a blot clot. Be sure to refill your prescription before you run out of medicine. Talk to your pediatrician regarding the use of this medicine in children. Special care may be needed. Overdosage: If you think you have taken too much of this medicine contact a poison control center or emergency room at once. NOTE: This medicine is only for you. Do not share this medicine with others. What if I miss a dose? If you miss a dose, take it as soon as you can. If it is almost time for your next dose, take only that dose. Do not take double or extra doses. What may interact with this medicine? This medicine may interact with the following: -aspirin and aspirin-like medicines -certain medicines for fungal infections like ketoconazole and itraconazole -certain medicines for seizures like carbamazepine and phenytoin -certain medicines that treat or prevent blood clots like warfarin, enoxaparin, and dalteparin -clarithromycin -NSAIDs, medicines for pain and inflammation, like ibuprofen or naproxen -rifampin -ritonavir -St. John's wort This list may not describe all possible interactions. Give your health care provider a list of all the medicines, herbs, non-prescription drugs, or dietary supplements you use. Also tell them if you smoke, drink alcohol, or use illegal drugs. Some items may interact with your medicine. What should I watch for while using this medicine? Visit your doctor or  health care professional for regular checks on your progress. Notify your doctor or health care professional and seek emergency treatment if you develop breathing problems; changes in vision; chest pain; severe, sudden headache; pain, swelling, warmth in the leg; trouble speaking; sudden numbness or weakness of the face, arm or leg. These  can be signs that your condition has gotten worse. If you are going to have surgery or other procedure, tell your doctor that you are taking this medicine. What side effects may I notice from receiving this medicine? Side effects that you should report to your doctor or health care professional as soon as possible: -allergic reactions like skin rash, itching or hives, swelling of the face, lips, or tongue -signs and symptoms of bleeding such as bloody or black, tarry stools; red or dark-brown urine; spitting up blood or brown material that looks like coffee grounds; red spots on the skin; unusual bruising or bleeding from the eye, gums, or nose This list may not describe all possible side effects. Call your doctor for medical advice about side effects. You may report side effects to FDA at 1-800-FDA-1088. Where should I keep my medicine? Keep out of the reach of children. Store at room temperature between 20 and 25 degrees C (68 and 77 degrees F). Throw away any unused medicine after the expiration date. NOTE: This sheet is a summary. It may not cover all possible information. If you have questions about this medicine, talk to your doctor, pharmacist, or health care provider.  2018 Elsevier/Gold Standard (2015-10-29 11:54:23)  Lisinopril tablets What is this medicine? LISINOPRIL (ly[place] IN oh pril) is an ACE inhibitor. This medicine is used to treat high blood pressure and heart failure. It is also used to protect the heart immediately after a heart attack. This medicine may be used for other purposes; ask your health care provider or pharmacist if you have questions. COMMON BRAND NAME(S): Prinivil, Zestril What should I tell my health care provider before I take this medicine? They need to know if you have any of these conditions: -diabetes -heart or blood vessel disease -kidney disease -low blood pressure -previous swelling of the tongue, face, or lips with difficulty breathing, difficulty  swallowing, hoarseness, or tightening of the throat -an unusual or allergic reaction to lisinopril, other ACE inhibitors, insect venom, foods, dyes, or preservatives -pregnant or trying to get pregnant -breast-feeding How should I use this medicine? Take this medicine by mouth with a glass of water. Follow the directions on your prescription label. You may take this medicine with or without food. If it upsets your stomach, take it with food. Take your medicine at regular intervals. Do not take it more often than directed. Do not stop taking except on your doctor's advice. Talk to your pediatrician regarding the use of this medicine in children. Special care may be needed. While this drug may be prescribed for children as young as 50 years of age for selected conditions, precautions do apply. Overdosage: If you think you have taken too much of this medicine contact a poison control center or emergency room at once. NOTE: This medicine is only for you. Do not share this medicine with others. What if I miss a dose? If you miss a dose, take it as soon as you can. If it is almost time for your next dose, take only that dose. Do not take double or extra doses. What may interact with this medicine? Do not take this medicine with any of the following medications: -  hymenoptera venom -sacubitril; valsartan This medicines may also interact with the following medications: -aliskiren -angiotensin receptor blockers, like losartan or valsartan -certain medicines for diabetes -diuretics -everolimus -gold compounds -lithium -NSAIDs, medicines for pain and inflammation, like ibuprofen or naproxen -potassium salts or supplements -salt substitutes -sirolimus -temsirolimus This list may not describe all possible interactions. Give your health care provider a list of all the medicines, herbs, non-prescription drugs, or dietary supplements you use. Also tell them if you smoke, drink alcohol, or use illegal  drugs. Some items may interact with your medicine. What should I watch for while using this medicine? Visit your doctor or health care professional for regular check ups. Check your blood pressure as directed. Ask your doctor what your blood pressure should be, and when you should contact him or her. Do not treat yourself for coughs, colds, or pain while you are using this medicine without asking your doctor or health care professional for advice. Some ingredients may increase your blood pressure. Women should inform their doctor if they wish to become pregnant or think they might be pregnant. There is a potential for serious side effects to an unborn child. Talk to your health care professional or pharmacist for more information. Check with your doctor or health care professional if you get an attack of severe diarrhea, nausea and vomiting, or if you sweat a lot. The loss of too much body fluid can make it dangerous for you to take this medicine. You may get drowsy or dizzy. Do not drive, use machinery, or do anything that needs mental alertness until you know how this drug affects you. Do not stand or sit up quickly, especially if you are an older patient. This reduces the risk of dizzy or fainting spells. Alcohol can make you more drowsy and dizzy. Avoid alcoholic drinks. Avoid salt substitutes unless you are told otherwise by your doctor or health care professional. What side effects may I notice from receiving this medicine? Side effects that you should report to your doctor or health care professional as soon as possible: -allergic reactions like skin rash, itching or hives, swelling of the hands, feet, face, lips, throat, or tongue -breathing problems -signs and symptoms of kidney injury like trouble passing urine or change in the amount of urine -signs and symptoms of increased potassium like muscle weakness; chest pain; or fast, irregular heartbeat -signs and symptoms of liver injury like dark  yellow or brown urine; general ill feeling or flu-like symptoms; light-colored stools; loss of appetite; nausea; right upper belly pain; unusually weak or tired; yellowing of the eyes or skin -signs and symptoms of low blood pressure like dizziness; feeling faint or lightheaded, falls; unusually weak or tired -stomach pain with or without nausea and vomiting Side effects that usually do not require medical attention (report to your doctor or health care professional if they continue or are bothersome): -changes in taste -cough -dizziness -fever -headache -sensitivity to light This list may not describe all possible side effects. Call your doctor for medical advice about side effects. You may report side effects to FDA at 1-800-FDA-1088. Where should I keep my medicine? Keep out of the reach of children. Store at room temperature between 15 and 30 degrees C (59 and 86 degrees F). Protect from moisture. Keep container tightly closed. Throw away any unused medicine after the expiration date. NOTE: This sheet is a summary. It may not cover all possible information. If you have questions about this medicine, talk to your doctor, pharmacist,  or health care provider.  2018 Elsevier/Gold Standard (2015-05-28 12:52:35)

## 2017-07-31 ENCOUNTER — Other Ambulatory Visit: Payer: Self-pay | Admitting: Internal Medicine

## 2017-08-04 ENCOUNTER — Other Ambulatory Visit: Payer: PPO | Admitting: *Deleted

## 2017-08-04 ENCOUNTER — Ambulatory Visit (INDEPENDENT_AMBULATORY_CARE_PROVIDER_SITE_OTHER): Payer: PPO | Admitting: *Deleted

## 2017-08-04 VITALS — BP 124/78 | HR 70 | Ht 71.0 in | Wt 195.0 lb

## 2017-08-04 DIAGNOSIS — I482 Chronic atrial fibrillation, unspecified: Secondary | ICD-10-CM

## 2017-08-04 DIAGNOSIS — I10 Essential (primary) hypertension: Secondary | ICD-10-CM

## 2017-08-04 LAB — BASIC METABOLIC PANEL
BUN / CREAT RATIO: 19 (ref 10–24)
BUN: 19 mg/dL (ref 8–27)
CO2: 24 mmol/L (ref 20–29)
Calcium: 8.8 mg/dL (ref 8.6–10.2)
Chloride: 104 mmol/L (ref 96–106)
Creatinine, Ser: 1 mg/dL (ref 0.76–1.27)
GFR calc Af Amer: 84 mL/min/{1.73_m2} (ref >59)
GFR, EST NON AFRICAN AMERICAN: 73 mL/min/{1.73_m2} (ref >59)
GLUCOSE: 95 mg/dL (ref 65–99)
Potassium: 4 mmol/L (ref 3.5–5.2)
Sodium: 141 mmol/L (ref 134–144)

## 2017-08-04 NOTE — Progress Notes (Signed)
1.) Reason for visit: BP check--recent med changes made per Dr Lovena Le  2.) Name of MD requesting visit: Dr. Lovena Le  3.) H&P: Pt has a hx of chronic a-fib, HTN, and on chronic systemic anticoagulation.  4.) ROS related to problem: Pt here for nurse visit BP check for noted HTN and med changes made per Dr. Lovena Le.  Dr. Lovena Le saw the pt on 07/21/17 and stopped his losartan (recalled), and switched the pt to lisinopril 20 mg po daily.  Pts amlodipine was also D/C'ed at that visit for lower extremity edema.  Pt was ordered to follow-up today for BP recheck.  Pts V/S today were:  BP-124/78 (Left arm and manually taken)HR-70-SPO2 98 % RA.  Pt is taking all meds as prescribed, with no complications.  Pt is asymptomatic at today's visit.  Pts LEE has improved.  Showed the DOD Dr Acie Fredrickson, pts current V/S and history, and per Dr Acie Fredrickson the pt should continue his current med regimen, and follow-up as planned with Dr Lovena Le, in 6 months.  Endorsed this to the pt and he verbalized understanding and agrees with this plan.  5.) Assessment and plan per MD: per DOD Dr. Acie Fredrickson, the pts BP looks good and he should continue his current med regimen and follow-up with Dr. Lovena Le, as outlined.

## 2017-08-04 NOTE — Patient Instructions (Signed)
Medication Instructions:   Your physician recommends that you continue on your current medications as directed. Please refer to the Current Medication list given to you today.    Labwork:  As scheduled for today to check a BMP     Follow-Up:  Your physician wants you to follow-up in: [place] will receive a reminder letter in the mail two months in advance. If you don't receive a letter, please call our office to schedule the follow-up appointment.        If you need a refill on your cardiac medications before your next appointment, please call your pharmacy.

## 2017-08-07 ENCOUNTER — Telehealth: Payer: Self-pay

## 2017-08-07 NOTE — Telephone Encounter (Signed)
error 

## 2017-08-25 DIAGNOSIS — I10 Essential (primary) hypertension: Secondary | ICD-10-CM | POA: Diagnosis not present

## 2017-08-25 DIAGNOSIS — I4891 Unspecified atrial fibrillation: Secondary | ICD-10-CM | POA: Diagnosis not present

## 2017-08-25 DIAGNOSIS — E7800 Pure hypercholesterolemia, unspecified: Secondary | ICD-10-CM | POA: Diagnosis not present

## 2017-08-25 DIAGNOSIS — M170 Bilateral primary osteoarthritis of knee: Secondary | ICD-10-CM | POA: Diagnosis not present

## 2017-09-04 ENCOUNTER — Other Ambulatory Visit: Payer: Self-pay | Admitting: Internal Medicine

## 2017-09-05 ENCOUNTER — Other Ambulatory Visit: Payer: Self-pay | Admitting: Internal Medicine

## 2017-10-02 DIAGNOSIS — M1711 Unilateral primary osteoarthritis, right knee: Secondary | ICD-10-CM | POA: Diagnosis not present

## 2017-10-02 DIAGNOSIS — M25561 Pain in right knee: Secondary | ICD-10-CM | POA: Diagnosis not present

## 2017-10-03 ENCOUNTER — Other Ambulatory Visit: Payer: Self-pay | Admitting: Internal Medicine

## 2017-10-05 NOTE — Telephone Encounter (Signed)
Instructions   Medication Instructions:  Your physician has recommended you make the following change in your medication:  1.  Stop taking losartan 2.  Stop taking amlodipine 3.  Start taking lisinopril 20 mg one tablet by mouth daily  4.  Stop taking warfarin.  Wait one day.  Then start taking.... 5.  Eliquis 5 mg one tablet by mouth twice a day.  Labwork: You will return in 2 weeks for a BMP (same day as your blood pressure check)  Testing/Procedures: You will return in 2 weeks for a blood pressure check.  Please schedule with pharmacy.  Follow-Up: Your physician wants you to follow-up in: 6 months with Dr. Lovena Le.

## 2017-10-05 NOTE — Telephone Encounter (Signed)
Outpatient Medication Detail    Disp Refills Start End   atorvastatin (LIPITOR) 10 MG tablet 90 tablet 1 07/01/2017    Sig: TAKE ONE TABLET BY MOUTH DAILY   Sent to pharmacy as: atorvastatin (LIPITOR) 10 MG tablet   E-Prescribing Status: Receipt confirmed by pharmacy (07/01/2017 2:12 PM EDT)   Pharmacy   [address] [place], [place]

## 2017-11-05 ENCOUNTER — Other Ambulatory Visit: Payer: Self-pay | Admitting: Internal Medicine

## 2017-11-11 DIAGNOSIS — M1711 Unilateral primary osteoarthritis, right knee: Secondary | ICD-10-CM | POA: Diagnosis not present

## 2017-11-17 DIAGNOSIS — D485 Neoplasm of uncertain behavior of skin: Secondary | ICD-10-CM | POA: Diagnosis not present

## 2017-11-17 DIAGNOSIS — L820 Inflamed seborrheic keratosis: Secondary | ICD-10-CM | POA: Diagnosis not present

## 2017-12-04 ENCOUNTER — Telehealth: Payer: Self-pay | Admitting: Internal Medicine

## 2017-12-04 MED ORDER — ATORVASTATIN CALCIUM 10 MG PO TABS: 10.0000 mg | ORAL_TABLET | Freq: Every day | ORAL | 0 refills | Status: DC

## 2017-12-04 NOTE — Telephone Encounter (Signed)
[facility] [place] [facility] [address].

## 2017-12-04 NOTE — Telephone Encounter (Signed)
New Message        *STAT* If patient is at the pharmacy, call can be transferred to refill team.   1. Which medications need to be refilled? (please list name of each medication and dose if known) atorvastatin (LIPITOR) 10 MG tablet  2. Which pharmacy/location (including street and city if local pharmacy) is medication to be sent to? [facility]  [phone]  3. Do they need a 30 day or 90 day supply? 30   Patient is on vacation and needs a refill

## 2017-12-04 NOTE — Telephone Encounter (Signed)
Okay to refill? Please advise. Thanks, MI 

## 2017-12-07 ENCOUNTER — Other Ambulatory Visit: Payer: Self-pay | Admitting: *Deleted

## 2017-12-07 NOTE — Patient Outreach (Signed)
[name]) Care Management  12/07/2017  Glenn [name] May 20, 1940 [id number]   Initial unsuccessful telephone outreach to member to complete [employer] health risk assessment screening. Left message on contact number requesting return call.  Barrington Ellison RN,CCM,CDE [place] Management Coordinator Office Phone [phone] Office Fax [phone]

## 2017-12-10 ENCOUNTER — Other Ambulatory Visit: Payer: Self-pay | Admitting: *Deleted

## 2017-12-10 NOTE — Patient Outreach (Signed)
[facility]) Care Management  12/10/2017  IZIK BINGMAN 1941-03-10 [id number]  Second unsuccessful telephone outreach to member in order to complete the [employer] health risk assessment screening. Left message oncontact number requesting return call.   Barrington Ellison RN,CCM,CDE [place] Management Coordinator Office Phone [phone] Office Fax [phone]

## 2017-12-14 ENCOUNTER — Other Ambulatory Visit: Payer: Self-pay

## 2017-12-14 NOTE — Patient Outreach (Signed)
[facility]) Care Management  12/14/2017  Glenn [name] 05-21-1940 [id number]   Telephone call to review health risk assessment and screen for care management needs for  [employer]. Third attempt No answer and HIPAA compliant  message left. 3rd outreach call.  Plan to send unsuccessful outreach letter.  [name] RN, [name], CDE Care Management Coordinator [facility] Care Management [phone]

## 2017-12-17 ENCOUNTER — Other Ambulatory Visit: Payer: Self-pay

## 2017-12-17 ENCOUNTER — Other Ambulatory Visit: Payer: Self-pay | Admitting: Internal Medicine

## 2017-12-17 NOTE — Patient Outreach (Signed)
Glenn [name]) Care Management  12/17/2017  Glenn [name] May 13, 1940 [id number]   Member returned telephone call to review Glenn risk assessment and screen for care management needs for  Glenn [name].  Member states that he was out of town and he is back now.  States he is very active and plays tennis.  States he has regular follow up with his doctor and cardiology.  States he has A. Fib but he has no symptoms with it.  States his B/P is well controlled with his medications.  States he does not need a Engineer, maintenance at this time.  Telephone screening call completed to review members Glenn risk assessment.   Member assessed with no further intervention needed. Declines Glenn coach Plan to send successful outreach letter with program brochure and 24 hr nurse line magnet.  Glenn Garter RN, Glenn [name], CDE Care Management Coordinator [name] Care Management [phone]

## 2017-12-29 DIAGNOSIS — D0439 Carcinoma in situ of skin of other parts of face: Secondary | ICD-10-CM | POA: Diagnosis not present

## 2017-12-29 DIAGNOSIS — L814 Other melanin hyperpigmentation: Secondary | ICD-10-CM | POA: Diagnosis not present

## 2017-12-29 DIAGNOSIS — D485 Neoplasm of uncertain behavior of skin: Secondary | ICD-10-CM | POA: Diagnosis not present

## 2017-12-29 DIAGNOSIS — D2362 Other benign neoplasm of skin of left upper limb, including shoulder: Secondary | ICD-10-CM | POA: Diagnosis not present

## 2017-12-29 DIAGNOSIS — L821 Other seborrheic keratosis: Secondary | ICD-10-CM | POA: Diagnosis not present

## 2017-12-29 DIAGNOSIS — D2372 Other benign neoplasm of skin of left lower limb, including hip: Secondary | ICD-10-CM | POA: Diagnosis not present

## 2017-12-29 DIAGNOSIS — L820 Inflamed seborrheic keratosis: Secondary | ICD-10-CM | POA: Diagnosis not present

## 2017-12-29 DIAGNOSIS — D1801 Hemangioma of skin and subcutaneous tissue: Secondary | ICD-10-CM | POA: Diagnosis not present

## 2018-01-01 ENCOUNTER — Other Ambulatory Visit: Payer: Self-pay | Admitting: Internal Medicine

## 2018-01-14 ENCOUNTER — Encounter (INDEPENDENT_AMBULATORY_CARE_PROVIDER_SITE_OTHER): Payer: Self-pay

## 2018-01-14 ENCOUNTER — Ambulatory Visit: Payer: PPO | Admitting: Internal Medicine

## 2018-01-14 ENCOUNTER — Encounter: Payer: Self-pay | Admitting: Internal Medicine

## 2018-01-14 VITALS — BP 128/80 | HR 64 | Ht 71.0 in | Wt 201.8 lb

## 2018-01-14 DIAGNOSIS — Z5181 Encounter for therapeutic drug level monitoring: Secondary | ICD-10-CM

## 2018-01-14 DIAGNOSIS — I10 Essential (primary) hypertension: Secondary | ICD-10-CM | POA: Diagnosis not present

## 2018-01-14 DIAGNOSIS — I482 Chronic atrial fibrillation, unspecified: Secondary | ICD-10-CM

## 2018-01-14 DIAGNOSIS — Z23 Encounter for immunization: Secondary | ICD-10-CM | POA: Diagnosis not present

## 2018-01-14 NOTE — Progress Notes (Signed)
HPI [name] returns today for followup.He is a very pleasant 77 year old man with chronic atrial fibrillation, hypertension, on chronic systemic anticoagulation, who also has conduction system disease as evidenced by his rate controlled A. fib on no AV nodal blocking drugs. He also had chronic peripheral edema which resolved with stopping of amlodipine. . No other complaints today. He has switched to an [facility] and is tolerating the Eliquis. No syncope.  No Known Allergies   Current Outpatient Medications  Medication Sig Dispense Refill  . amoxicillin (AMOXIL) 500 MG capsule Take 4 caps by mouth 1 hour prior to dental procedure    . apixaban (ELIQUIS) 5 MG TABS tablet Take 1 tablet (5 mg total) by mouth 2 (two) times daily. 60 tablet 11  . aspirin EC 81 MG tablet Take 81 mg by mouth daily.    Marland Kitchen atorvastatin (LIPITOR) 10 MG tablet TAKE 1 TABLET BY MOUTH EVERY DAY 90 tablet 2  . hydrochlorothiazide (HYDRODIURIL) 25 MG tablet TAKE ONE TABLET BY MOUTH DAILY 90 tablet 2  . lisinopril (PRINIVIL,ZESTRIL) 20 MG tablet Take 1 tablet (20 mg total) by mouth daily. 90 tablet 3   No current facility-administered medications for this visit.      Past Medical History:  Diagnosis Date  . Atrial fibrillation ([place])    takes aspirin for this  . Brachial plexus injury 1995   Left arm  . HTN (hypertension)    6 years  . Injury of brachial plexus   . Prostate cancer ([place])   . Prostate cancer ([place])   . Torn rotator cuff   . Torn rotator cuff 2000   Right arm    ROS:   All systems reviewed and negative except as noted in the HPI.   Past Surgical History:  Procedure Laterality Date  . ETT  2006   High fitness but bp of 255  . Int fixation left forearm  01/19/1989  . KNEE SURGERY     left  . PROSTATECTOMY    . removal of basal cell       Family History  Problem Relation Age of Onset  . Heart attack Father        MI  . Obesity Brother   . Parkinsonism Brother   . Prostate cancer  Brother   . Colon cancer Neg Hx   . Esophageal cancer Neg Hx   . Stomach cancer Neg Hx   . Rectal cancer Neg Hx      Social History   Socioeconomic History  . Marital status: Married    Spouse name: Not on file  . Number of children: Not on file  . Years of education: Not on file  . Highest education level: Not on file  Occupational History  . 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 Financial resource strain: Not on file  . Food insecurity:    Worry: Not on file    Inability: Not on file  . Transportation needs:    Medical: Not on file    Non-medical: Not on file  Tobacco Use  . Smoking status: Former Smoker    Last attempt to quit: 04/21/1985    Years since quitting: 32.7  . Smokeless tobacco: Never Used  Substance and Sexual Activity  . Alcohol use: Yes    Comment: Social  . Drug use: No  . Sexual activity: Not on file  Lifestyle  . Physical activity:    Days per week:  Not on file    Minutes per session: Not on file  . Stress: Not on file  Relationships  . Social connections:    Talks on phone: Not on file    Gets together: Not on file    Attends religious service: Not on file    Active member of club or organization: Not on file    Attends meetings of clubs or organizations: Not on file    Relationship status: Not on file  . Intimate partner violence:    Fear of current or ex partner: Not on file    Emotionally abused: Not on file    Physically abused: Not on file    Forced sexual activity: Not on file  Other Topics Concern  . 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[employer] after 2 final fours   married to [place]; 2 male and 1 male children;    multiple grandkids;    non smoker - quit 25 years ago   social ETOH     BP 128/80   Pulse 64   Ht 5\' 11"  (1.803 m)   Wt 201 lb 12.8 oz (91.5 kg)   SpO2 97%   BMI 28.15 kg/m   Physical Exam:  Well appearing 77 yo man,  NAD HEENT: Unremarkable Neck:  No JVD, no thyromegally Lymphatics:  No adenopathy Back:  No CVA tenderness Lungs:  Clear with no wheezes HEART:  Regular rate rhythm, no murmurs, no rubs, no clicks Abd:  soft, positive bowel sounds, no organomegally, no rebound, no guarding Ext:  2 plus pulses, no edema, no cyanosis, no clubbing Skin:  No rashes no nodules Neuro:  CN II through XII intact, motor grossly intact  EKG - atrial fib with a controlled VR   Assess/Plan: 1. Atrial fib - his ventricular rate is well controlled. He will continue his current meds. 2. coags - he has not had any bleeding on Eliquis. I encouraged him not to miss any doses. 3. HTN - his blood pressure is well controlled. No change for now. 4. Peripheral edema - largely resolved with cessation of amlodipine.   Glenn [name].D.

## 2018-01-14 NOTE — Patient Instructions (Addendum)

## 2018-02-04 DIAGNOSIS — C44319 Basal cell carcinoma of skin of other parts of face: Secondary | ICD-10-CM | POA: Diagnosis not present

## 2018-02-04 DIAGNOSIS — Z85828 Personal history of other malignant neoplasm of skin: Secondary | ICD-10-CM | POA: Diagnosis not present

## 2018-03-26 DIAGNOSIS — Z8546 Personal history of malignant neoplasm of prostate: Secondary | ICD-10-CM | POA: Diagnosis not present

## 2018-03-26 DIAGNOSIS — I480 Paroxysmal atrial fibrillation: Secondary | ICD-10-CM | POA: Diagnosis not present

## 2018-03-26 DIAGNOSIS — Z0000 Encounter for general adult medical examination without abnormal findings: Secondary | ICD-10-CM | POA: Diagnosis not present

## 2018-03-26 DIAGNOSIS — I10 Essential (primary) hypertension: Secondary | ICD-10-CM | POA: Diagnosis not present

## 2018-03-26 DIAGNOSIS — E7800 Pure hypercholesterolemia, unspecified: Secondary | ICD-10-CM | POA: Diagnosis not present

## 2018-05-13 ENCOUNTER — Telehealth: Payer: Self-pay | Admitting: *Deleted

## 2018-05-13 NOTE — Telephone Encounter (Signed)
   [facility] Pre-operative Risk Assessment    Request for surgical clearance:  1. What type of surgery is being performed? RIGHT TOTAL KNEE   2. When is this surgery scheduled? 07/13/18   3. What type of clearance is required (medical clearance vs. Pharmacy clearance to hold med vs. Both)? BOTH  4. Are there any medications that need to be held prior to surgery and how long?ELIQUIS X 3 DAYS PRIOR  5. Practice name and name of physician performing surgery? [facility]; DR. Alvan Dame   6. What is your office phone number [phone]    7.   What is your office fax number [phone]  8.   Anesthesia type (None, local, MAC, general) ? SPINAL   Julaine Hua 05/13/2018, 5:06 PM  _________________________________________________________________   (provider comments below)

## 2018-05-14 NOTE — Telephone Encounter (Signed)
Patient with diagnosis of atrial fibrillation on Eliquis for anticoagulation.    Procedure: right total knee Date of procedure: 07/13/1918  CHADS2-VASc score of  3 (, HTN, AGE,  AGE,   CrCl 78.5 Platelet count - last found in Epic/KPN was 2013 at 172  Per office protocol, patient can hold Eliquis for 3 days prior to procedure.    For orthopedic procedures please be sure to resume therapeutic (not prophylactic) dosing.

## 2018-05-14 NOTE — Telephone Encounter (Signed)
LMTCB  Leontine Radman PA-C 05/14/2018 2:15 PM

## 2018-05-14 NOTE — Telephone Encounter (Signed)
   Primary Cardiologist: Dr Lovena Le  Chart reviewed as part of pre-operative protocol coverage. Given past medical history and time since last visit, based on ACC/AHA guidelines, Glenn [name] would be at acceptable risk for the planned procedure without further cardiovascular testing.   OK to hold Eliquis 3 days pre op.  Resume therapeutic dose post op when able.   I will route this recommendation to the requesting party via Epic fax function and remove from pre-op pool.  Please call with questions.  Kerin Ransom, PA-C 05/14/2018, 3:14 PM

## 2018-06-24 NOTE — H&P (Signed)
TOTAL KNEE ADMISSION H&P  Patient is being admitted for right total knee arthroplasty.  Subjective:  Chief Complaint:right knee pain.  HPI: Glenn [name], 78 y.o. male, has a history of pain and functional disability in the right knee due to arthritis and has failed non-surgical conservative treatments for greater than 12 weeks to includecorticosteriod injections and activity modification.  Onset of symptoms was gradual, starting 5 years ago with gradually worsening course since that time. The patient noted no past surgery on the right knee(s).  Patient currently rates pain in the right knee(s) at 5 out of 10 with activity. Patient has worsening of pain with activity and weight bearing and pain that interferes with activities of daily living.  Patient has evidence of periarticular osteophytes and joint space narrowing by imaging studies. There is no active infection.  Patient Active Problem List   Diagnosis Date Noted  . Bilateral knee pain 04/22/2017  . History of total knee replacement, left 04/22/2017  . Osteoarthritis of knee 04/22/2017  . Dizziness 12/21/2012  . ATRIAL FIBRILLATION 05/31/2010  . NEOPLASM OF UNCERTAIN BEHAVIOR OF SKIN 06/17/2007  . HYPERTENSION, BENIGN 04/29/2007  . RHINITIS 04/29/2007  . COUGH, CHRONIC 04/29/2007  . SKIN CANCER 06/18/2006  . PROSTATE CANCER 06/18/2006  . OSTEOARTHRITIS, LOWER LEG 06/18/2006  . OSTEOARTHRITIS OF SPINE, NOS 06/18/2006   Past Medical History:  Diagnosis Date  . Atrial fibrillation ([place])    takes aspirin for this  . Brachial plexus injury 1995   Left arm  . HTN (hypertension)    6 years  . Injury of brachial plexus   . Prostate cancer ([place])   . Prostate cancer ([place])   . Torn rotator cuff   . Torn rotator cuff 2000   Right arm    Past Surgical History:  Procedure Laterality Date  . ETT  2006   High fitness but bp of 255  . Int fixation left forearm  01/19/1989  . KNEE SURGERY     left  . PROSTATECTOMY    . removal of  basal cell      No current facility-administered medications for this encounter.    Current Outpatient Medications  Medication Sig Dispense Refill Last Dose  . amoxicillin (AMOXIL) 500 MG capsule Take 4 caps by mouth 1 hour prior to dental procedure   Taking  . apixaban (ELIQUIS) 5 MG TABS tablet Take 1 tablet (5 mg total) by mouth 2 (two) times daily. 60 tablet 11 Taking  . aspirin EC 81 MG tablet Take 81 mg by mouth daily.   Taking  . atorvastatin (LIPITOR) 10 MG tablet TAKE 1 TABLET BY MOUTH EVERY DAY 90 tablet 2 Taking  . hydrochlorothiazide (HYDRODIURIL) 25 MG tablet TAKE ONE TABLET BY MOUTH DAILY 90 tablet 2 Taking  . lisinopril (PRINIVIL,ZESTRIL) 20 MG tablet Take 1 tablet (20 mg total) by mouth daily. 90 tablet 3 Taking   No Known Allergies  Social History   Tobacco Use  . Smoking status: Former Smoker    Last attempt to quit: 04/21/1985    Years since quitting: 33.1  . Smokeless tobacco: Never Used  Substance Use Topics  . Alcohol use: Yes    Comment: Social    Family History  Problem Relation Age of Onset  . Heart attack Father        MI  . Obesity Brother   . Parkinsonism Brother   . Prostate cancer Brother   . Colon cancer Neg Hx   . Esophageal cancer Neg  Hx   . Stomach cancer Neg Hx   . Rectal cancer Neg Hx      ROS Constitutional: Constitutional: no fever, chills, night sweats, or significant weight loss.  Cardiovascular: Cardiovascular: no palpitations or chest pain.  Respiratory: Respiratory: no cough or shortness of breath and No COPD.  Gastrointestinal: Gastrointestinal: no vomiting or nausea.  Musculoskeletal: Musculoskeletal: no swelling in Joints and Joint Pain.  Neurologic: Neurologic: no numbness, tingling, or difficulty with balance. Objective:  Physical Exam  Patient is a 78 year old male.  Well nourished and well developed.  General: Alert and oriented x3, cooperative and pleasant, no acute distress.  Head: normocephalic, atraumatic,  neck supple.  Eyes: EOMI.  Respiratory: breath sounds clear in all fields, no wheezing, rales, or rhonchi.  Cardiovascular: Regular rate and rhythm, no murmurs, gallops or rubs.  Abdomen: non-tender to palpation and soft, normoactive bowel sounds.  Musculoskeletal: RIGHT KNEE EXAM Inspection: Skin intact, no gross deformity, and no rashes, no effusion Neurovascular: Sensation intact to light touch distally and brisk capillary refill  ROM - 5-100 with tightness/discomfort  Calves soft and nontender. Motor function intact in LE. Strength 5/5 LE bilaterally.  Neuro: Distal pulses 2+. Sensation to light touch intact in LE.  Vital signs in last 24 hours:  Labs:   Estimated body mass index is 28.15 kg/m as calculated from the following:   Height as of 01/14/18: 5\' 11"  (1.803 m).   Weight as of 01/14/18: 91.5 kg.   Imaging Review Plain radiographs demonstrate severe degenerative joint disease of the right knee(s). The overall alignment isneutral. The bone quality appears to be adequate for age and reported activity level.      Assessment/Plan:  End stage arthritis, right knee   The patient history, physical examination, clinical judgment of the provider and imaging studies are consistent with end stage degenerative joint disease of the right knee(s) and total knee arthroplasty is deemed medically necessary. The treatment options including medical management, injection therapy arthroscopy and arthroplasty were discussed at length. The risks and benefits of total knee arthroplasty were presented and reviewed. The risks due to aseptic loosening, infection, stiffness, patella tracking problems, thromboembolic complications and other imponderables were discussed. The patient acknowledged the explanation, agreed to proceed with the plan and consent was signed. Patient is being admitted for inpatient treatment for surgery, pain control, PT, OT, prophylactic antibiotics, VTE prophylaxis,  progressive ambulation and ADL's and discharge planning. The patient is planning to be discharged home.    Anticipated LOS equal to or greater than 2 midnights due to - Age 55 and older with one or more of the following:  - Obesity  - Expected need for hospital services (PT, OT, Nursing) required for safe  discharge  - Anticipated need for postoperative skilled nursing care or inpatient rehab  - Active co-morbidities: None OR   - Unanticipated findings during/Post Surgery: None  - Patient is a high risk of re-admission due to: None    Therapy Plans: outpatient therapy at [facility] Disposition: Home with wife  Planned DVT Prophylaxis: Eliquis (A fib) DME needed: none PCP: Dr. Donnie Coffin  Cardiologist: Dr. Lovena Le TXA: IV Allergies: NKDA  Anesthesia Concerns: none BMI: 25.8   Other: Hx of atrial fibrillation, on Eliquis. Norco. Interested in [place] for first two weeks.  - Patient was instructed on what medications to stop prior to surgery. - Follow-up visit in 2 weeks with Dr. Alvan Dame - Begin physical therapy following surgery - Pre-operative lab work as pre-surgical testing -  Prescriptions will be provided in hospital at time of discharge  Griffith Citron, PA-C Orthopedic Surgery [facility] [place]

## 2018-06-29 DIAGNOSIS — L821 Other seborrheic keratosis: Secondary | ICD-10-CM | POA: Diagnosis not present

## 2018-06-29 DIAGNOSIS — L814 Other melanin hyperpigmentation: Secondary | ICD-10-CM | POA: Diagnosis not present

## 2018-06-29 DIAGNOSIS — L738 Other specified follicular disorders: Secondary | ICD-10-CM | POA: Diagnosis not present

## 2018-06-29 DIAGNOSIS — Z85828 Personal history of other malignant neoplasm of skin: Secondary | ICD-10-CM | POA: Diagnosis not present

## 2018-06-29 DIAGNOSIS — L281 Prurigo nodularis: Secondary | ICD-10-CM | POA: Diagnosis not present

## 2018-06-29 DIAGNOSIS — L570 Actinic keratosis: Secondary | ICD-10-CM | POA: Diagnosis not present

## 2018-07-05 NOTE — Patient Instructions (Addendum)
Glenn [name]  07/05/2018   Your procedure is scheduled on: 07-13-18    Report to [facility] Main  Entrance   Report to [place] at 5:30 AM    Call this number if you have problems the morning of surgery [phone]    Remember: Do not eat food or drink liquids :After Midnight.    BRUSH YOUR TEETH MORNING OF SURGERY AND RINSE YOUR MOUTH OUT, NO CHEWING GUM CANDY OR MINTS.     Take these medicines the morning of surgery with A SIP OF WATER: Atorvastatin (Lipitor)                                 You may not have any metal on your body including hair pins and              piercings  Do not wear jewelry, cologne, lotions, powders or deodorant             Men may shave face and neck.   Do not bring valuables to the hospital. [place].  Contacts, dentures or bridgework may not be worn into surgery.  Leave suitcase in the car. After surgery it may be brought to your room.     Patients discharged the day of surgery will not be allowed to drive home. IF YOU ARE HAVING SURGERY AND GOING HOME THE SAME DAY, YOU MUST HAVE AN ADULT TO DRIVE YOU HOME AND BE WITH YOU FOR 24 HOURS. YOU MAY GO HOME BY TAXI OR UBER OR ORTHERWISE, BUT AN ADULT MUST ACCOMPANY YOU HOME AND STAY WITH YOU FOR 24 HOURS.    Special Instructions: N/A              Please read over the following fact sheets you were given: _____________________________________________________________________             [facility] - Preparing for Surgery Before surgery, you can play an important role.  Because skin is not sterile, your skin needs to be as free of germs as possible.  You can reduce the number of germs on your skin by washing with CHG (chlorahexidine gluconate) soap before surgery.  CHG is an antiseptic cleaner which kills germs and bonds with the skin to continue killing germs even after washing. Please DO NOT use if you have an allergy to CHG or  antibacterial soaps.  If your skin becomes reddened/irritated stop using the CHG and inform your nurse when you arrive at Short Stay. Do not shave (including legs and underarms) for at least 48 hours prior to the first CHG shower.  You may shave your face/neck. Please follow these instructions carefully:  1.  Shower with CHG Soap the night before surgery and the  morning of Surgery.  2.  If you choose to wash your hair, wash your hair first as usual with your  normal  shampoo.  3.  After you shampoo, rinse your hair and body thoroughly to remove the  shampoo.                           4.  Use CHG as you would any other liquid soap.  You can apply chg  directly  to the skin and wash                       Gently with a scrungie or clean washcloth.  5.  Apply the CHG Soap to your body ONLY FROM THE NECK DOWN.   Do not use on face/ open                           Wound or open sores. Avoid contact with eyes, ears mouth and genitals (private parts).                       Wash face,  Genitals (private parts) with your normal soap.             6.  Wash thoroughly, paying special attention to the area where your surgery  will be performed.  7.  Thoroughly rinse your body with warm water from the neck down.  8.  DO NOT shower/wash with your normal soap after using and rinsing off  the CHG Soap.                9.  Pat yourself dry with a clean towel.            10.  Wear clean pajamas.            11.  Place clean sheets on your bed the night of your first shower and do not  sleep with pets. Day of Surgery : Do not apply any lotions/deodorants the morning of surgery.  Please wear clean clothes to the hospital/surgery center.  FAILURE TO FOLLOW THESE INSTRUCTIONS MAY RESULT IN THE CANCELLATION OF YOUR SURGERY PATIENT SIGNATURE_________________________________  NURSE SIGNATURE__________________________________  ________________________________________________________________________   Glenn [name]  An incentive spirometer is a tool that can help keep your lungs clear and active. This tool measures how well you are filling your lungs with each breath. Taking long deep breaths may help reverse or decrease the chance of developing breathing (pulmonary) problems (especially infection) following:  A long period of time when you are unable to move or be active. BEFORE THE PROCEDURE   If the spirometer includes an indicator to show your best effort, your nurse or respiratory therapist will set it to a desired goal.  If possible, sit up straight or lean slightly forward. Try not to slouch.  Hold the incentive spirometer in an upright position. INSTRUCTIONS FOR USE  1. Sit on the edge of your bed if possible, or sit up as far as you can in bed or on a chair. 2. Hold the incentive spirometer in an upright position. 3. Breathe out normally. 4. Place the mouthpiece in your mouth and seal your lips tightly around it. 5. Breathe in slowly and as deeply as possible, raising the piston or the ball toward the top of the column. 6. Hold your breath for 3-5 seconds or for as long as possible. Allow the piston or ball to fall to the bottom of the column. 7. Remove the mouthpiece from your mouth and breathe out normally. 8. Rest for a few seconds and repeat Steps 1 through 7 at least 10 times every 1-2 hours when you are awake. Take your time and take a few normal breaths between deep breaths. 9. The spirometer may include an indicator to show your best effort. Use the indicator as a goal to work toward during each repetition. 10.  After each set of 10 deep breaths, practice coughing to be sure your lungs are clear. If you have an incision (the cut made at the time of surgery), support your incision when coughing by placing a pillow or rolled up towels firmly against it. Once you are able to get out of bed, walk around indoors and cough well. You may stop using the incentive spirometer when  instructed by your caregiver.  RISKS AND COMPLICATIONS  Take your time so you do not get dizzy or light-headed.  If you are in pain, you may need to take or ask for pain medication before doing incentive spirometry. It is harder to take a deep breath if you are having pain. AFTER USE  Rest and breathe slowly and easily.  It can be helpful to keep track of a log of your progress. Your caregiver can provide you with a simple table to help with this. If you are using the spirometer at home, follow these instructions: [place] IF:   You are having difficultly using the spirometer.  You have trouble using the spirometer as often as instructed.  Your pain medication is not giving enough relief while using the spirometer.  You develop fever of 100.5 F (38.1 C) or higher. SEEK IMMEDIATE MEDICAL CARE IF:   You cough up bloody sputum that had not been present before.  You develop fever of 102 F (38.9 C) or greater.  You develop worsening pain at or near the incision site. MAKE SURE YOU:   Understand these instructions.  Will watch your condition.  Will get help right away if you are not doing well or get worse. Document Released: 08/18/2006 Document Revised: 06/30/2011 Document Reviewed: 10/19/2006 ExitCare Patient Information 2014 ExitCare, [place].   ________________________________________________________________________  WHAT IS A BLOOD TRANSFUSION? Blood Transfusion Information  A transfusion is the replacement of blood or some of its parts. Blood is made up of multiple cells which provide different functions.  Red blood cells carry oxygen and are used for blood loss replacement.  White blood cells fight against infection.  Platelets control bleeding.  Plasma helps clot blood.  Other blood products are available for specialized needs, such as hemophilia or other clotting disorders. BEFORE THE TRANSFUSION  Who gives blood for transfusions?   Healthy  volunteers who are fully evaluated to make sure their blood is safe. This is blood bank blood. Transfusion therapy is the safest it has ever been in the practice of medicine. Before blood is taken from a donor, a complete history is taken to make sure that person has no history of diseases nor engages in risky social behavior (examples are intravenous drug use or sexual activity with multiple partners). The donor's travel history is screened to minimize risk of transmitting infections, such as malaria. The donated blood is tested for signs of infectious diseases, such as HIV and hepatitis. The blood is then tested to be sure it is compatible with you in order to minimize the chance of a transfusion reaction. If you or a relative donates blood, this is often done in anticipation of surgery and is not appropriate for emergency situations. It takes many days to process the donated blood. RISKS AND COMPLICATIONS Although transfusion therapy is very safe and saves many lives, the main dangers of transfusion include:   Getting an infectious disease.  Developing a transfusion reaction. This is an allergic reaction to something in the blood you were given. Every precaution is taken to prevent this. The decision  to have a blood transfusion has been considered carefully by your caregiver before blood is given. Blood is not given unless the benefits outweigh the risks. AFTER THE TRANSFUSION  Right after receiving a blood transfusion, you will usually feel much better and more energetic. This is especially true if your red blood cells have gotten low (anemic). The transfusion raises the level of the red blood cells which carry oxygen, and this usually causes an energy increase.  The nurse administering the transfusion will monitor you carefully for complications. HOME CARE INSTRUCTIONS  No special instructions are needed after a transfusion. You may find your energy is better. Speak with your caregiver about any  limitations on activity for underlying diseases you may have. SEEK MEDICAL CARE IF:   Your condition is not improving after your transfusion.  You develop redness or irritation at the intravenous (IV) site. SEEK IMMEDIATE MEDICAL CARE IF:  Any of the following symptoms occur over the next 12 hours:  Shaking chills.  You have a temperature by mouth above 102 F (38.9 C), not controlled by medicine.  Chest, back, or muscle pain.  People around you feel you are not acting correctly or are confused.  Shortness of breath or difficulty breathing.  Dizziness and fainting.  You get a rash or develop hives.  You have a decrease in urine output.  Your urine turns a dark color or changes to pink, red, or brown. Any of the following symptoms occur over the next 10 days:  You have a temperature by mouth above 102 F (38.9 C), not controlled by medicine.  Shortness of breath.  Weakness after normal activity.  The white part of the eye turns yellow (jaundice).  You have a decrease in the amount of urine or are urinating less often.  Your urine turns a dark color or changes to pink, red, or brown. Document Released: 04/04/2000 Document Revised: 06/30/2011 Document Reviewed: 11/22/2007 [facility] Patient Information 2014 [place], [place].  _______________________________________________________________________

## 2018-07-05 NOTE — Progress Notes (Signed)
05-14-18 ( Epic) Cardiac Clearance from [place], [place]  01-14-18 (Epic) EKG

## 2018-07-06 ENCOUNTER — Encounter (HOSPITAL_COMMUNITY)
Admission: RE | Admit: 2018-07-06 | Discharge: 2018-07-06 | Disposition: A | Discharge status: Home / Self Care | Payer: PPO | Source: Ambulatory Visit | Attending: Orthopedic Surgery | Admitting: Orthopedic Surgery

## 2018-07-06 ENCOUNTER — Other Ambulatory Visit: Payer: Self-pay

## 2018-07-06 ENCOUNTER — Encounter (HOSPITAL_COMMUNITY): Payer: Self-pay

## 2018-07-06 ENCOUNTER — Other Ambulatory Visit: Payer: Self-pay | Admitting: Internal Medicine

## 2018-07-06 DIAGNOSIS — Z01812 Encounter for preprocedural laboratory examination: Secondary | ICD-10-CM | POA: Insufficient documentation

## 2018-07-06 LAB — BASIC METABOLIC PANEL
Anion gap: 7 (ref 5–15)
BUN: 20 mg/dL (ref 8–23)
CO2: 25 mmol/L (ref 22–32)
Calcium: 9 mg/dL (ref 8.9–10.3)
Chloride: 106 mmol/L (ref 98–111)
Creatinine, Ser: 0.87 mg/dL (ref 0.61–1.24)
GFR calc Af Amer: >60 mL/min (ref >60)
GFR calc non Af Amer: >60 mL/min (ref >60)
GLUCOSE: 99 mg/dL (ref 70–99)
Potassium: 3.8 mmol/L (ref 3.5–5.1)
Sodium: 138 mmol/L (ref 135–145)

## 2018-07-06 LAB — CBC
HCT: 46.4 % (ref 39.0–52.0)
Hemoglobin: 14.9 g/dL (ref 13.0–17.0)
MCH: 30.7 pg (ref 26.0–34.0)
MCHC: 32.1 g/dL (ref 30.0–36.0)
MCV: 95.7 fL (ref 80.0–100.0)
NRBC: 0 % (ref 0.0–0.2)
Platelets: 220 10*3/uL (ref 150–400)
RBC: 4.85 MIL/uL (ref 4.22–5.81)
RDW: 13.6 % (ref 11.5–15.5)
WBC: 8.5 10*3/uL (ref 4.0–10.5)

## 2018-07-06 LAB — SURGICAL PCR SCREEN
MRSA, PCR: NEGATIVE
Staphylococcus aureus: NEGATIVE

## 2018-07-06 NOTE — Progress Notes (Signed)
PCP:Dr. Donnie Coffin  CARDIOLOGIST: Cristopher Peru, MD  INFO IN Epic:  05-14-18 ( Epic) Cardiac Clearance from [place], [place]  01-14-18 (Epic) EKG  INFO ON CHART:  BLOOD THINNERS AND LAST DOSES: Eliquis. Pt plan to hold 3 days prior to surgery. 07/10/18 ____________________________________  PATIENT SYMPTOMS AT TIME OF PREOP:  Hx of Afib, and HTN

## 2018-07-13 ENCOUNTER — Inpatient Hospital Stay (HOSPITAL_COMMUNITY): Admission: RE | Admit: 2018-07-13 | Payer: PPO | Source: Home / Self Care | Admitting: Orthopedic Surgery

## 2018-07-13 ENCOUNTER — Encounter (HOSPITAL_COMMUNITY): Admission: RE | Payer: Self-pay | Source: Home / Self Care

## 2018-07-13 LAB — TYPE AND SCREEN
ABO/RH(D): A POS
Antibody Screen: NEGATIVE

## 2018-07-13 SURGERY — ARTHROPLASTY, KNEE, TOTAL
Anesthesia: Spinal | Laterality: Right

## 2018-07-15 ENCOUNTER — Other Ambulatory Visit: Payer: Self-pay | Admitting: Internal Medicine

## 2018-07-27 ENCOUNTER — Other Ambulatory Visit: Payer: Self-pay | Admitting: Internal Medicine

## 2018-07-27 NOTE — Telephone Encounter (Signed)
Pt is a 78 yr old male who last saw Dr. Lovena Le on 01/17/18, wt at that visit was 91.5Kg. SCr on 07/06/18 was 0.87. Will refill Eliquis 5mg  BID.

## 2018-08-24 ENCOUNTER — Telehealth: Payer: Self-pay | Admitting: Internal Medicine

## 2018-08-24 NOTE — Telephone Encounter (Signed)
New message   [facility] for pt to call back and schedule a virtual visit-doxemity video if pt has a smart phone.

## 2018-08-25 DIAGNOSIS — M25561 Pain in right knee: Secondary | ICD-10-CM | POA: Diagnosis not present

## 2018-08-25 DIAGNOSIS — M7542 Impingement syndrome of left shoulder: Secondary | ICD-10-CM | POA: Diagnosis not present

## 2018-08-25 DIAGNOSIS — M1711 Unilateral primary osteoarthritis, right knee: Secondary | ICD-10-CM | POA: Diagnosis not present

## 2018-09-09 ENCOUNTER — Other Ambulatory Visit: Payer: Self-pay | Admitting: Internal Medicine

## 2018-09-28 DIAGNOSIS — E7800 Pure hypercholesterolemia, unspecified: Secondary | ICD-10-CM | POA: Diagnosis not present

## 2018-09-28 DIAGNOSIS — I4891 Unspecified atrial fibrillation: Secondary | ICD-10-CM | POA: Diagnosis not present

## 2018-09-28 DIAGNOSIS — I10 Essential (primary) hypertension: Secondary | ICD-10-CM | POA: Diagnosis not present

## 2018-09-28 DIAGNOSIS — M170 Bilateral primary osteoarthritis of knee: Secondary | ICD-10-CM | POA: Diagnosis not present

## 2018-10-05 DIAGNOSIS — I10 Essential (primary) hypertension: Secondary | ICD-10-CM | POA: Diagnosis not present

## 2018-12-08 DIAGNOSIS — M1711 Unilateral primary osteoarthritis, right knee: Secondary | ICD-10-CM | POA: Diagnosis not present

## 2018-12-08 DIAGNOSIS — M25561 Pain in right knee: Secondary | ICD-10-CM | POA: Diagnosis not present

## 2018-12-29 ENCOUNTER — Other Ambulatory Visit: Payer: Self-pay | Admitting: Internal Medicine

## 2019-01-15 ENCOUNTER — Other Ambulatory Visit: Payer: Self-pay | Admitting: Internal Medicine

## 2019-02-22 ENCOUNTER — Other Ambulatory Visit: Payer: Self-pay | Admitting: Internal Medicine

## 2019-02-28 DIAGNOSIS — L281 Prurigo nodularis: Secondary | ICD-10-CM | POA: Diagnosis not present

## 2019-02-28 DIAGNOSIS — L738 Other specified follicular disorders: Secondary | ICD-10-CM | POA: Diagnosis not present

## 2019-02-28 DIAGNOSIS — D1801 Hemangioma of skin and subcutaneous tissue: Secondary | ICD-10-CM | POA: Diagnosis not present

## 2019-02-28 DIAGNOSIS — L821 Other seborrheic keratosis: Secondary | ICD-10-CM | POA: Diagnosis not present

## 2019-02-28 DIAGNOSIS — Z85828 Personal history of other malignant neoplasm of skin: Secondary | ICD-10-CM | POA: Diagnosis not present

## 2019-02-28 DIAGNOSIS — D692 Other nonthrombocytopenic purpura: Secondary | ICD-10-CM | POA: Diagnosis not present

## 2019-03-06 ENCOUNTER — Other Ambulatory Visit: Payer: Self-pay | Admitting: Internal Medicine

## 2019-03-08 ENCOUNTER — Other Ambulatory Visit: Payer: Self-pay | Admitting: Internal Medicine

## 2019-03-08 MED ORDER — HYDROCHLOROTHIAZIDE 25 MG PO TABS: 25.0000 mg | ORAL_TABLET | Freq: Every day | ORAL | 0 refills | Status: DC

## 2019-03-08 NOTE — Telephone Encounter (Signed)
Pt's medication was sent to pt's pharmacy as requested. Confirmation received.  °

## 2019-03-08 NOTE — Telephone Encounter (Signed)
°*  STAT* If patient is at the pharmacy, call can be transferred to refill team.   1. Which medications need to be refilled? (please list name of each medication and dose if known) Hydrochlorothiazide 25 mg  2. Which pharmacy/location (including street and city if local pharmacy) is medication to be sent to? [facility] [place]  3. Do they need a 30 day or 90 day supply? [place]

## 2019-03-10 DIAGNOSIS — H1131 Conjunctival hemorrhage, right eye: Secondary | ICD-10-CM | POA: Diagnosis not present

## 2019-03-16 DIAGNOSIS — Z20828 Contact with and (suspected) exposure to other viral communicable diseases: Secondary | ICD-10-CM | POA: Diagnosis not present

## 2019-03-16 DIAGNOSIS — J069 Acute upper respiratory infection, unspecified: Secondary | ICD-10-CM | POA: Diagnosis not present

## 2019-03-16 DIAGNOSIS — Z9189 Other specified personal risk factors, not elsewhere classified: Secondary | ICD-10-CM | POA: Diagnosis not present

## 2019-03-21 ENCOUNTER — Encounter (INDEPENDENT_AMBULATORY_CARE_PROVIDER_SITE_OTHER): Payer: Self-pay

## 2019-03-21 ENCOUNTER — Encounter: Payer: Self-pay | Admitting: Internal Medicine

## 2019-03-21 ENCOUNTER — Ambulatory Visit: Payer: PPO | Admitting: Internal Medicine

## 2019-03-21 ENCOUNTER — Other Ambulatory Visit: Payer: Self-pay

## 2019-03-21 VITALS — BP 146/78 | HR 74 | Ht 70.0 in | Wt 202.0 lb

## 2019-03-21 DIAGNOSIS — I10 Essential (primary) hypertension: Secondary | ICD-10-CM

## 2019-03-21 DIAGNOSIS — I482 Chronic atrial fibrillation, unspecified: Secondary | ICD-10-CM

## 2019-03-21 NOTE — Patient Instructions (Signed)

## 2019-03-21 NOTE — Progress Notes (Signed)
HPI Glenn [name] returns today for followup.He is a very pleasant 78 year old man with chronic atrial fibrillation, hypertension, on chronic systemic anticoagulation, who also has conduction system disease as evidenced by his rate controlled A. fib on no AV nodal blocking drugs. He also had chronic peripheral edema which resolved with stopping of amlodipine. . No other complaints today.He has switched to an [facility] and is tolerating the Eliquis. No syncope. He has had bleeding in the conjunctiva.   No Known Allergies   Current Outpatient Medications  Medication Sig Dispense Refill  . amoxicillin (AMOXIL) 500 MG capsule Take 2,000 mg by mouth See admin instructions. Take 2000 mg by mouth 1 hour prior to dental procedure    . atorvastatin (LIPITOR) 10 MG tablet TAKE ONE TABLET BY MOUTH DAILY 90 tablet 3  . ELIQUIS 5 MG TABS tablet TAKE ONE TABLET BY MOUTH TWICE A DAY 180 tablet 2  . hydrochlorothiazide (HYDRODIURIL) 25 MG tablet Take 1 tablet (25 mg total) by mouth daily. Please keep upcoming appt in November with Dr. Lovena Le before anymore refills. Thank you 90 tablet 0  . lisinopril (ZESTRIL) 20 MG tablet Take 1 tablet (20 mg total) by mouth daily. Please schedule an appt 90 tablet 0   No current facility-administered medications for this visit.      Past Medical History:  Diagnosis Date  . Atrial fibrillation ([place])    takes aspirin for this  . Brachial plexus injury 1995   Left arm  . HTN (hypertension)    6 years  . Injury of brachial plexus   . Prostate cancer ([employer])   . Prostate cancer ([place])   . Torn rotator cuff   . Torn rotator cuff 2000   Right arm    ROS:   All systems reviewed and negative except as noted in the HPI.   Past Surgical History:  Procedure Laterality Date  . ETT  2006   High fitness but bp of 255  . Int fixation left forearm  01/19/1989  . KNEE SURGERY     left  . PROSTATECTOMY    . removal of basal cell    . TONSILLECTOMY       Family  History  Problem Relation Age of Onset  . Heart attack Father        MI  . Obesity Brother   . Parkinsonism Brother   . Prostate cancer Brother   . Colon cancer Neg Hx   . Esophageal cancer Neg Hx   . Stomach cancer Neg Hx   . Rectal cancer Neg Hx      Social History   Socioeconomic History  . Marital status: Married    Spouse name: Not on file  . Number of children: Not on file  . Years of education: Not on file  . Highest education level: Not on file  Occupational History  . 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 Financial resource strain: Not on file  . Food insecurity    Worry: Not on file    Inability: Not on file  . Transportation needs    Medical: Not on file    Non-medical: Not on file  Tobacco Use  . Smoking status: Former Smoker    Packs/day: 0.50    Years: 15.00    Pack years: 7.50    Quit date: 04/21/1985    Years since quitting: 33.9  . Smokeless tobacco: Never Used  Substance and Sexual Activity  .  Alcohol use: Yes    Comment: Social  . Drug use: No  . Sexual activity: Not on file  Lifestyle  . Physical activity    Days per week: Not on file    Minutes per session: Not on file  . Stress: Not on file  Relationships  . Social Herbalist on phone: Not on file    Gets together: Not on file    Attends religious service: Not on file    Active member of club or organization: Not on file    Attends meetings of clubs or organizations: Not on file    Relationship status: Not on file  . Intimate partner violence    Fear of current or ex partner: Not on file    Emotionally abused: Not on file    Physically abused: Not on file    Forced sexual activity: Not on file  Other Topics Concern  . 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[employer] after 2 final fours   married to [place]; 2 male and 1 male children;    multiple grandkids;    non smoker - quit 25  years ago   social ETOH     BP (!) 146/78   Pulse 74   Ht 5\' 10"  (1.778 m)   Wt 202 lb (91.6 kg)   SpO2 97%   BMI 28.98 kg/m   Physical Exam:  Well appearing NAD HEENT: Unremarkable Neck:  No JVD, no thyromegally Lymphatics:  No adenopathy Back:  No CVA tenderness Lungs:  Clear with no wheezes HEART:  Regular rate rhythm, no murmurs, no rubs, no clicks Abd:  soft, positive bowel sounds, no organomegally, no rebound, no guarding Ext:  2 plus pulses, no edema, no cyanosis, no clubbing Skin:  No rashes no nodules Neuro:  CN II through XII intact, motor grossly intact  EKG - atrial fib with PVC's   Assess/Plan: 1. Chronic atrial fib - his VR is well controlled. He will continue his current meds. His rates are controlled. 2. HTN -his bp has been well controlled. Up today. No change in meds. He will call us if his bp goes up. 3. Coags - he has had conjunctival bleeding but no other. I would not stop his systemic anti-coagulation at this point as CHADSVASC is 3.   Mikle Bosworth.D.

## 2019-03-26 ENCOUNTER — Other Ambulatory Visit: Payer: Self-pay | Admitting: Internal Medicine

## 2019-04-05 DIAGNOSIS — H2513 Age-related nuclear cataract, bilateral: Secondary | ICD-10-CM | POA: Diagnosis not present

## 2019-04-05 DIAGNOSIS — H524 Presbyopia: Secondary | ICD-10-CM | POA: Diagnosis not present

## 2019-04-05 DIAGNOSIS — H1131 Conjunctival hemorrhage, right eye: Secondary | ICD-10-CM | POA: Diagnosis not present

## 2019-04-05 DIAGNOSIS — H5211 Myopia, right eye: Secondary | ICD-10-CM | POA: Diagnosis not present

## 2019-04-05 DIAGNOSIS — H52203 Unspecified astigmatism, bilateral: Secondary | ICD-10-CM | POA: Diagnosis not present

## 2019-04-18 ENCOUNTER — Other Ambulatory Visit: Payer: Self-pay | Admitting: Internal Medicine

## 2019-04-19 NOTE — Telephone Encounter (Signed)
Eliquis 5mg  refill request received, pt is 78 yrs old, weight-91.6kg, Crea-0.87 on 07/06/2018, Diagnosis-Afib, and last seen by Dr. Lovena Le on 03/21/2019. Dose is appropriate based on dosing criteria. Will send in refill to requested pharmacy.

## 2019-04-28 DIAGNOSIS — I480 Paroxysmal atrial fibrillation: Secondary | ICD-10-CM | POA: Diagnosis not present

## 2019-04-28 DIAGNOSIS — I10 Essential (primary) hypertension: Secondary | ICD-10-CM | POA: Diagnosis not present

## 2019-04-28 DIAGNOSIS — E7800 Pure hypercholesterolemia, unspecified: Secondary | ICD-10-CM | POA: Diagnosis not present

## 2019-04-28 DIAGNOSIS — Z0000 Encounter for general adult medical examination without abnormal findings: Secondary | ICD-10-CM | POA: Diagnosis not present

## 2019-04-28 DIAGNOSIS — Z8546 Personal history of malignant neoplasm of prostate: Secondary | ICD-10-CM | POA: Diagnosis not present

## 2019-05-08 ENCOUNTER — Ambulatory Visit (HOSPITAL_COMMUNITY)
Admission: EM | Admit: 2019-05-08 | Discharge: 2019-05-08 | Disposition: A | Discharge status: Home / Self Care | Payer: PPO | Attending: Family Medicine | Admitting: Family Medicine

## 2019-05-08 ENCOUNTER — Other Ambulatory Visit: Payer: Self-pay

## 2019-05-08 ENCOUNTER — Encounter (HOSPITAL_COMMUNITY): Payer: Self-pay

## 2019-05-08 DIAGNOSIS — S90922A Unspecified superficial injury of left foot, initial encounter: Secondary | ICD-10-CM

## 2019-05-08 DIAGNOSIS — L089 Local infection of the skin and subcutaneous tissue, unspecified: Secondary | ICD-10-CM

## 2019-05-08 MED ORDER — DOXYCYCLINE HYCLATE 100 MG PO CAPS: 100.0000 mg | ORAL_CAPSULE | Freq: Two times a day (BID) | ORAL | 0 refills | Status: DC

## 2019-05-08 NOTE — ED Provider Notes (Signed)
[facility]    CSN: [id number] Arrival date & time: 05/08/19  1136      History   Chief Complaint Chief Complaint  Patient presents with  . Foot Pain    HPI LADARIOUS SCHRIEVER is a 79 y.o. male.   Dontell L Beightol presents with complaints of pain, swelling, redness to right heel. States that on 1/13 he stepped on something on his floor, he has wood flooring, but didn't see anything in particular, but had pain. Was able to engage in his normal activities such as tennis but still had noted some discomfort. Last night and into this morning significant increase in pain with redness and swelling. His wife wasn't table to see any particular splinter but he is concerned there is a splinter present. No diabetes. History  Of afib on eliquis.     ROS per HPI, negative if not otherwise mentioned.      Past Medical History:  Diagnosis Date  . Atrial fibrillation ([place])    takes aspirin for this  . Brachial plexus injury 1995   Left arm  . HTN (hypertension)    6 years  . Injury of brachial plexus   . Prostate cancer ([place])   . Prostate cancer ([place])   . Torn rotator cuff   . Torn rotator cuff 2000   Right arm    Patient Active Problem List   Diagnosis Date Noted  . Bilateral knee pain 04/22/2017  . History of total knee replacement, left 04/22/2017  . Osteoarthritis of knee 04/22/2017  . Dizziness 12/21/2012  . ATRIAL FIBRILLATION 05/31/2010  . NEOPLASM OF UNCERTAIN BEHAVIOR OF SKIN 06/17/2007  . HYPERTENSION, BENIGN 04/29/2007  . RHINITIS 04/29/2007  . COUGH, CHRONIC 04/29/2007  . SKIN CANCER 06/18/2006  . PROSTATE CANCER 06/18/2006  . OSTEOARTHRITIS, LOWER LEG 06/18/2006  . OSTEOARTHRITIS OF SPINE, NOS 06/18/2006    Past Surgical History:  Procedure Laterality Date  . ETT  2006   High fitness but bp of 255  . Int fixation left forearm  01/19/1989  . KNEE SURGERY     left  . PROSTATECTOMY    . removal of basal cell    . TONSILLECTOMY         Home Medications     Prior to Admission medications   Medication Sig Start Date End Date Taking? Authorizing Provider  amoxicillin (AMOXIL) 500 MG capsule Take 2,000 mg by mouth See admin instructions. Take 2000 mg by mouth 1 hour prior to dental procedure    [provider]  atorvastatin (LIPITOR) 10 MG tablet TAKE ONE TABLET BY MOUTH DAILY 09/09/18   Evans Lance, MD  doxycycline (VIBRAMYCIN) 100 MG capsule Take 1 capsule (100 mg total) by mouth 2 (two) times daily. 05/08/19   Zigmund Gottron, NP  ELIQUIS 5 MG TABS tablet TAKE ONE TABLET BY MOUTH TWICE A DAY 04/19/19   Evans Lance, MD  hydrochlorothiazide (HYDRODIURIL) 25 MG tablet Take 1 tablet (25 mg total) by mouth daily. Please keep upcoming appt in November with Dr. Lovena Le before anymore refills. Thank you 03/08/19   Evans Lance, MD  lisinopril (ZESTRIL) 20 MG tablet TAKE ONE TABLET BY MOUTH DAILY  *NEED OFFICE VISIT* 03/28/19   Evans Lance, MD    Family History Family History  Problem Relation Age of Onset  . Heart attack Father        MI  . Obesity Brother   . Parkinsonism Brother   . Prostate cancer  Brother   . Colon cancer Neg Hx   . Esophageal cancer Neg Hx   . Stomach cancer Neg Hx   . Rectal cancer Neg Hx     Social History Social History   Tobacco Use  . Smoking status: Former Smoker    Packs/day: 0.50    Years: 15.00    Pack years: 7.50    Quit date: 04/21/1985    Years since quitting: 34.0  . Smokeless tobacco: Never Used  Substance Use Topics  . Alcohol use: Yes    Comment: Social  . Drug use: No     Allergies   Patient has no known allergies.   Review of Systems Review of Systems   Physical Exam Triage Vital Signs ED Triage Vitals  Enc Vitals Group     BP 05/08/19 1210 (!) 159/101     Pulse --      Resp 05/08/19 1210 18     Temp 05/08/19 1210 98.5 F (36.9 C)     Temp Source 05/08/19 1210 Oral     SpO2 05/08/19 1210 97 %     Weight 05/08/19 1209 195 lb (88.5 kg)     Height --       Head Circumference --      Peak Flow --      Pain Score 05/08/19 1209 9     Pain Loc --      Pain Edu? --      Excl. in [place]? --    No data found.  Updated Vital Signs BP (!) 159/101 (BP Location: Right Arm)   Temp 98.5 F (36.9 C) (Oral)   Resp 18   Wt 195 lb (88.5 kg)   SpO2 97%   BMI 27.98 kg/m    Physical Exam Constitutional:      Appearance: He is well-developed.  Pulmonary:     Effort: Pulmonary effort is normal.  Musculoskeletal:       Feet:  Feet:     Comments: Approximately 63mm fissure to plantar aspect of right heel which is very tender; with surrounding redness noted into right medial foot; no drainage from area; difficult initial exam limited by pain and tenderness; attempt at some anesthesia with lidocaine to the area with significant discomfort, patient pulls foot away; needle for localized anesthesia inserted briefly, and able to confirm that it is a fissure, without obvious visible or palpable splinter; further exploration aborted; noted dryness to skin of foot  Skin:    General: Skin is warm and dry.  Neurological:     Mental Status: He is alert and oriented to person, place, and time.          UC Treatments / Results  Labs (all labs ordered are listed, but only abnormal results are displayed) Labs Reviewed - No data to display  EKG   Radiology No results found.  Procedures Procedures (including critical care time)  Medications Ordered in UC Medications - No data to display  Initial Impression / Assessment and Plan / UC Course  I have reviewed the triage vital signs and the nursing notes.  Pertinent labs & imaging results that were available during my care of the patient were reviewed by me and considered in my medical decision making (see chart for details).     Due to the sensitive location of this fissure, patient with low tolerance for exploration to determine if any presence of splinter. This is open wound (initially it appeared  that it could be a  splinter itself), and now with increased pain redness and swelling. Antibiotics provided and encouraged warm soaks to promote expulsion of any foreign body present. Return precautions provided. Patient verbalized understanding and agreeable to plan.   Final Clinical Impressions(s) / UC Diagnoses   Final diagnoses:  Infected superficial injury of left foot, initial encounter     Discharge Instructions     You have a definite fissure/open area of skin to your heel which appears to have surrounding infection.  I do not see any obvious splinter present, but it could have been small and deeper than visible.  Soak foot 3-4 times a day in warm water.  Complete course of antibiotics.  Continue to follow with your primary care provider as needed if persistent.  Return here to be seen if any worsening- fevers, increased redness or otherwise concerned.    ED Prescriptions    Medication Sig Dispense Auth. Provider   doxycycline (VIBRAMYCIN) 100 MG capsule Take 1 capsule (100 mg total) by mouth 2 (two) times daily. 20 capsule Zigmund Gottron, NP     PDMP not reviewed this encounter.   Zigmund Gottron, NP 05/08/19 1330

## 2019-05-08 NOTE — Discharge Instructions (Signed)
You have a definite fissure/open area of skin to your heel which appears to have surrounding infection.  I do not see any obvious splinter present, but it could have been small and deeper than visible.  Soak foot 3-4 times a day in warm water.  Complete course of antibiotics.  Continue to follow with your primary care provider as needed if persistent.  Return here to be seen if any worsening- fevers, increased redness or otherwise concerned.

## 2019-05-08 NOTE — ED Triage Notes (Signed)
Pt states he has as splinter in his right heel. X 4 days pt states he thinks hid heel is infected.

## 2019-05-31 ENCOUNTER — Ambulatory Visit (INDEPENDENT_AMBULATORY_CARE_PROVIDER_SITE_OTHER): Payer: PPO

## 2019-05-31 ENCOUNTER — Encounter: Payer: Self-pay | Admitting: Orthopedic Surgery

## 2019-05-31 ENCOUNTER — Other Ambulatory Visit: Payer: Self-pay

## 2019-05-31 ENCOUNTER — Ambulatory Visit (INDEPENDENT_AMBULATORY_CARE_PROVIDER_SITE_OTHER): Payer: PPO | Admitting: Orthopedic Surgery

## 2019-05-31 ENCOUNTER — Ambulatory Visit: Payer: PPO | Admitting: Sports Medicine

## 2019-05-31 ENCOUNTER — Encounter: Payer: Self-pay | Admitting: Sports Medicine

## 2019-05-31 VITALS — Ht 71.0 in | Wt 190.0 lb

## 2019-05-31 VITALS — BP 128/80 | Ht 71.0 in | Wt 190.0 lb

## 2019-05-31 DIAGNOSIS — M79671 Pain in right foot: Secondary | ICD-10-CM

## 2019-05-31 DIAGNOSIS — G8929 Other chronic pain: Secondary | ICD-10-CM

## 2019-05-31 DIAGNOSIS — S91301A Unspecified open wound, right foot, initial encounter: Secondary | ICD-10-CM

## 2019-05-31 DIAGNOSIS — S90851A Superficial foreign body, right foot, initial encounter: Secondary | ICD-10-CM

## 2019-05-31 NOTE — Patient Instructions (Signed)
Your heel pain is from an infection from the trauma you had approximately 1 month ago.  This wound has still not healed completely we would like you to see a foot and ankle specialist who deals with these types of wounds.  We will refer you to Dr. Sharol Given for second opinion for management of your heel wound.  In the meantime if you have any questions or concerns please not hesitate to contact our clinic

## 2019-05-31 NOTE — Progress Notes (Signed)
PCP: Alroy Dust, L.Marlou Sa, MD  Subjective:   HPI: Patient is a 79 y.o. male here for evaluation of right heel pain.  Patient had a trauma to his right heel approximately 5 weeks ago.  He is unsure what actually caused the wound but notes when he was walking his kitchen he fell to instant sharp pain on the plantar aspect of his heel.  He noticed an open wound following this.  Several weeks later he went to the emergency room as this wound was not getting better and he started developing swelling and redness around the wound.  He was placed on 10-day course of doxycycline and notes that surrounding redness improved.  He still has ongoing pain at the site of the wound notes it is not fully healed or close to it.  She denies any systemic symptoms such as fevers, chills, night sweats.   Review of Systems: See HPI above.  Past Medical History:  Diagnosis Date  . Atrial fibrillation ([place])    takes aspirin for this  . Brachial plexus injury 1995   Left arm  . HTN (hypertension)    6 years  . Injury of brachial plexus   . Prostate cancer ([place])   . Prostate cancer ([place])   . Torn rotator cuff   . Torn rotator cuff 2000   Right arm    Current Outpatient Medications on File Prior to Visit  Medication Sig Dispense Refill  . amoxicillin (AMOXIL) 500 MG capsule Take 2,000 mg by mouth See admin instructions. Take 2000 mg by mouth 1 hour prior to dental procedure    . atorvastatin (LIPITOR) 10 MG tablet TAKE ONE TABLET BY MOUTH DAILY 90 tablet 3  . ELIQUIS 5 MG TABS tablet TAKE ONE TABLET BY MOUTH TWICE A DAY 180 tablet 1  . hydrochlorothiazide (HYDRODIURIL) 25 MG tablet Take 1 tablet (25 mg total) by mouth daily. Please keep upcoming appt in November with Dr. Lovena Le before anymore refills. Thank you 90 tablet 0  . lisinopril (ZESTRIL) 20 MG tablet TAKE ONE TABLET BY MOUTH DAILY  *NEED OFFICE VISIT* 90 tablet 0  . doxycycline (VIBRAMYCIN) 100 MG capsule Take 1 capsule (100 mg total) by mouth 2 (two)  times daily. (Patient not taking: Reported on 05/31/2019) 20 capsule 0   No current facility-administered medications on file prior to visit.    Past Surgical History:  Procedure Laterality Date  . ETT  2006   High fitness but bp of 255  . Int fixation left forearm  01/19/1989  . KNEE SURGERY     left  . PROSTATECTOMY    . removal of basal cell    . TONSILLECTOMY      No Known Allergies  Social History   Socioeconomic History  . Marital status: Married    Spouse name: Not on file  . Number of children: Not on file  . Years of education: Not on file  . Highest education level: Not on file  Occupational History  . Occupation: Architectural technologist: Retired  Tobacco Use  . Smoking status: Former Smoker    Packs/day: 0.50    Years: 15.00    Pack years: 7.50    Quit date: 04/21/1985    Years since quitting: 34.1  . Smokeless tobacco: Never Used  Substance and Sexual Activity  . Alcohol use: Yes    Comment: Social  . Drug use: No  . Sexual activity: Not on file  Other Topics Concern  . Not on  file  Social History Narrative   works as Cabin crew;    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[employer] after 2 final fours   married to [place]; 2 male and 1 male children;    multiple grandkids;    non smoker - quit 25 years ago   social ETOH   Social Determinants of Health   Financial Resource Strain:   . Difficulty of Paying Living Expenses: Not on file  Food Insecurity:   . Worried About Charity fundraiser in the Last Year: Not on file  . Ran Out of Food in the Last Year: Not on file  Transportation Needs:   . Lack of Transportation (Medical): Not on file  . Lack of Transportation (Non-Medical): Not on file  Physical Activity:   . Days of Exercise per Week: Not on file  . Minutes of Exercise per Session: Not on file  Stress:   . Feeling of Stress : Not on file  Social Connections:   . Frequency of Communication with Friends and Family: Not on file  . Frequency of  Social Gatherings with Friends and Family: Not on file  . Attends Religious Services: Not on file  . Active Member of Clubs or Organizations: Not on file  . Attends Archivist Meetings: Not on file  . Marital Status: Not on file  Intimate Partner Violence:   . Fear of Current or Ex-Partner: Not on file  . Emotionally Abused: Not on file  . Physically Abused: Not on file  . Sexually Abused: Not on file    Family History  Problem Relation Age of Onset  . Heart attack Father        MI  . Obesity Brother   . Parkinsonism Brother   . Prostate cancer Brother   . Colon cancer Neg Hx   . Esophageal cancer Neg Hx   . Stomach cancer Neg Hx   . Rectal cancer Neg Hx         Objective:  Physical Exam: There were no vitals taken for this visit. Gen: NAD, comfortable in exam room Lungs: Breathing comfortably on room air Right foot -Small 3 mm fissure on the plantar aspect of the right heel.  This is very tender to palpation.  No active drainage.  There is a small blister above this area measuring approximately 1 cm in diameter.  No surrounding erythema.  The area is not warm to touch   Assessment & Plan:  Patient is a 79 y.o. male here for evaluation of right heel pain  1.  Right foot heel wound -Patient with a chronic nonhealing wound on the plantar aspect of his right heel.  He is not currently having systemic symptoms.  No active drainage from the wound today -No evidence of superficial infection however given the chronicity of the wound there is a concern for deeper infection not able to be visualized on physical exam today -Patient will referred to Dr. Sharol Given for further evaluation and management of patient's heel wound  Patient seen and evaluated with the sports medicine fellow.  I agree with the above plan of care.  I am concerned about a smoldering infection in his left heel.  Referral to Dr. Sharol Given for his input.  Patient may need an MRI to rule out osteomyelitis or foreign  body but I will defer that decision to Dr. Sharol Given.  Follow-up with me as needed.

## 2019-06-01 ENCOUNTER — Encounter: Payer: Self-pay | Admitting: Orthopedic Surgery

## 2019-06-01 NOTE — Progress Notes (Signed)
Office Visit Note   Patient: Glenn [name]           Date of Birth: 04/29/1940           MRN: [id number] Visit Date: 05/31/2019              Requested by: Thurman Coyer, DO [address] [place],  [place] [address] PCP: Alroy Dust, L.Marlou Sa, MD  Chief Complaint  Patient presents with  . Right Foot - Pain      HPI: Patient is a 79 year old gentleman who presents with a 5-week history of right heel pain.  Patient states that it started acutely while at home.  He states he has completed a course of antibiotics and was recently golfing and developed a blood blister on the heel patient feels like this may have been from his golfing shoe wear.  Assessment & Plan: Visit Diagnoses:  1. Heel pain, chronic, right     Plan: Patient will wash the heel with soap and water daily use antibiotic ointment and a Band-Aid follow-up as needed.  Follow-Up Instructions: Return if symptoms worsen or fail to improve.   Ortho Exam  Patient is alert, oriented, no adenopathy, well-dressed, normal affect, normal respiratory effort. Examination patient has good pulses he does have some Achilles tightness with dorsiflexion 10 degrees short of neutral.  Lateral compression of the calcaneus is nontender he is acutely tender to palpation at the origin of the plantar fascia.  Patient does have a wound on the heel with a blood blister extending from the wound.  This was extremely painful to light touch.  After informed consent pickups were used and a piece of glass was removed from the subcutaneous tissue piece of glass was 5 mm in diameter and a half a millimeter thick  Imaging: XR Os Calcis Right  Result Date: 06/01/2019 2 view radiographs of the right calcaneus shows no bony abnormalities no foreign bodies  No images are attached to the encounter.  Labs: No results found for: HGBA1C, ESRSEDRATE, CRP, LABURIC, REPTSTATUS, GRAMSTAIN, CULT, LABORGA   Lab Results  Component Value Date   ALBUMIN 4.0  07/08/2016   ALBUMIN 4.2 04/29/2007    No results found for: MG No results found for: VD25OH  No results found for: PREALBUMIN CBC EXTENDED Latest Ref Rng & Units 07/06/2018 06/18/2011 06/12/2010  WBC 4.0 - 10.5 K/uL 8.5 7.8 14.1(H)  RBC 4.22 - 5.81 MIL/uL 4.85 4.78 3.93(L)  HGB 13.0 - 17.0 g/dL 14.9 15.0 12.2(L)  HCT 39.0 - 52.0 % 46.4 44.8 36.3(L)  PLT 150 - 400 K/uL 220 172.0 190  NEUTROABS 1.4 - 7.7 K/uL - 5.5 -  LYMPHSABS 0.7 - 4.0 K/uL - 1.6 -     Body mass index is 26.5 kg/m.  Orders:  Orders Placed This Encounter  Procedures  . XR Os Calcis Right   No orders of the defined types were placed in this encounter.    Procedures: No procedures performed  Clinical Data: No additional findings.  ROS:  All other systems negative, except as noted in the HPI. Review of Systems  Objective: Vital Signs: Ht 5\' 11"  (1.803 m)   Wt 190 lb (86.2 kg)   BMI 26.50 kg/m   Specialty Comments:  No specialty comments available.  PMFS History: Patient Active Problem List   Diagnosis Date Noted  . Bilateral knee pain 04/22/2017  . History of total knee replacement, left 04/22/2017  . Osteoarthritis of knee 04/22/2017  . Dizziness 12/21/2012  .  ATRIAL FIBRILLATION 05/31/2010  . NEOPLASM OF UNCERTAIN BEHAVIOR OF SKIN 06/17/2007  . HYPERTENSION, BENIGN 04/29/2007  . RHINITIS 04/29/2007  . COUGH, CHRONIC 04/29/2007  . SKIN CANCER 06/18/2006  . PROSTATE CANCER 06/18/2006  . OSTEOARTHRITIS, LOWER LEG 06/18/2006  . OSTEOARTHRITIS OF SPINE, NOS 06/18/2006   Past Medical History:  Diagnosis Date  . Atrial fibrillation ([place])    takes aspirin for this  . Brachial plexus injury 1995   Left arm  . HTN (hypertension)    6 years  . Injury of brachial plexus   . Prostate cancer ([place])   . Prostate cancer ([place])   . Torn rotator cuff   . Torn rotator cuff 2000   Right arm    Family History  Problem Relation Age of Onset  . Heart attack Father        MI  . Obesity Brother    . Parkinsonism Brother   . Prostate cancer Brother   . Colon cancer Neg Hx   . Esophageal cancer Neg Hx   . Stomach cancer Neg Hx   . Rectal cancer Neg Hx     Past Surgical History:  Procedure Laterality Date  . ETT  2006   High fitness but bp of 255  . Int fixation left forearm  01/19/1989  . KNEE SURGERY     left  . PROSTATECTOMY    . removal of basal cell    . TONSILLECTOMY     Social History   Occupational History  . Occupation: Architectural technologist: Retired  Tobacco Use  . Smoking status: Former Smoker    Packs/day: 0.50    Years: 15.00    Pack years: 7.50    Quit date: 04/21/1985    Years since quitting: 34.1  . Smokeless tobacco: Never Used  Substance and Sexual Activity  . Alcohol use: Yes    Comment: Social  . Drug use: No  . Sexual activity: Not on file

## 2019-06-03 ENCOUNTER — Other Ambulatory Visit: Payer: Self-pay | Admitting: Internal Medicine

## 2019-06-27 ENCOUNTER — Other Ambulatory Visit: Payer: Self-pay | Admitting: Internal Medicine

## 2019-07-15 ENCOUNTER — Telehealth: Payer: Self-pay | Admitting: *Deleted

## 2019-07-15 NOTE — Telephone Encounter (Signed)
Patient with diagnosis of afib on Eliquis for anticoagulation.    Procedure:  RIGHT TOTAL KNEE ARTHROPLASTY  Date of procedure: 08/09/2019  CHADS2-VASc score of  3 (HTN, AGE, AGE)  CrCl 66 ml/min  Per office protocol, patient can hold Eliquis for 3 days prior to procedure.

## 2019-07-15 NOTE — Telephone Encounter (Signed)
   [facility] Pre-operative Risk Assessment    Request for surgical clearance:  1. What type of surgery is being performed? RIGHT TOTAL KNEE ARTHROPLASTY   2. When is this surgery scheduled? 08/09/19   3. What type of clearance is required (medical clearance vs. Pharmacy clearance to hold med vs. Both)? BOTH  4. Are there any medications that need to be held prior to surgery and how long? ELIQUIS   5. Practice name and name of physician performing surgery? [facility]; DR. [place]   6. What is your office phone number [phone]    7.   What is your office fax number [phone]  8.   Anesthesia type (None, local, MAC, general) ? SPINAL   Julaine Hua 07/15/2019, 11:54 AM  _________________________________________________________________   (provider comments below)

## 2019-07-15 NOTE — Telephone Encounter (Signed)
I called to update pt status since last visit. He would be low risk for surgery if no new ischemic type symptoms. Left VM for pt to call back and ask for preop clinic.

## 2019-07-20 NOTE — Telephone Encounter (Signed)
   Primary Cardiologist: Dr.Greg Lovena Le  Chart reviewed as part of pre-operative protocol coverage. Given past medical history and time since last visit, based on ACC/AHA guidelines, Glenn [name] would be at acceptable risk for the planned procedure without further cardiovascular testing.   Per Pharmacy, he may hold his Eliquis for 3 days prior to surgery, and begin again ASAP thereafter.   I have spoken to [name] and informed him of this plan. He is without cardiac complaints. Has an appointment with EP on 07/28/2019 for routine follow up.   I will route this recommendation to the requesting party via Epic fax function and remove from pre-op pool.  Please call with questions.  [name]. Nuria Phebus DNP, ANP, AACC  07/20/2019, 11:28 AM

## 2019-07-26 ENCOUNTER — Ambulatory Visit: Payer: PPO | Admitting: Physician Assistant

## 2019-07-26 ENCOUNTER — Other Ambulatory Visit: Payer: Self-pay

## 2019-07-26 VITALS — BP 162/90 | HR 53 | Ht 71.0 in | Wt 199.0 lb

## 2019-07-26 DIAGNOSIS — Z01818 Encounter for other preprocedural examination: Secondary | ICD-10-CM

## 2019-07-26 DIAGNOSIS — I10 Essential (primary) hypertension: Secondary | ICD-10-CM

## 2019-07-26 DIAGNOSIS — I482 Chronic atrial fibrillation, unspecified: Secondary | ICD-10-CM

## 2019-07-26 NOTE — Patient Instructions (Signed)

## 2019-07-26 NOTE — Progress Notes (Signed)
Cardiology Office Note Date:  07/26/2019  Patient ID:  Glenn [name], DOB 04/17/1941, MRN [id number] PCP:  Aurea Graff.Marlou Sa, MD  Cardiologist:  Dr. Lovena Le    Chief Complaint: pre-op  History of Present Illness: Glenn [name] is a 79 y.o. male with history of HTN, prostate cancer (s/p prostatectomy), and permanent AFib on a/c.  He comes today to be seen for Dr. Lovena Le, last seen by him Nov 2020.  He was doing well, rate controlled.  Mentioned edema resolved off amlodipine, noted conjunctival bleeding, no plans to stop his OAC,  Monitor BP off amlodipine.    He is pending R total knee arthroplasty requiring clearance.  Pharmacy has completed completed eliquis recommendations (off 3 days).  Mentions he was all ready and cleared for his knee surgery last March and cancelled 2/2 COVID.  He says his knee is weak not so much painful and for the first time when playing tennis a week ago or so gave out on him.  He is still playing tennis twice a week and golfing regularly.  He denies any exertional incapacities.   NO CP, no palpitations, no cardiac awareness.  NO rest SOB, no DOE, no symptoms or PND or orthopnea. No dizzy spells, near syncope or syncope.  He bled quite a bit after a tooth extraction last year, otherwise no bleeding or signs of bleeidng, tnds to bruise easily.  RCRI: score is zero, risk 0.4% DUKE:  8.97METS  Afib Hx Diagnosed incidentally Feb 2012 No AAD hx  Past Medical History:  Diagnosis Date  . Atrial fibrillation ([place])    takes aspirin for this  . Brachial plexus injury 1995   Left arm  . HTN (hypertension)    6 years  . Injury of brachial plexus   . Prostate cancer ([place])   . Prostate cancer ([place])   . Torn rotator cuff   . Torn rotator cuff 2000   Right arm    Past Surgical History:  Procedure Laterality Date  . ETT  2006   High fitness but bp of 255  . Int fixation left forearm  01/19/1989  . KNEE SURGERY     left  . PROSTATECTOMY    . removal of basal cell     . TONSILLECTOMY      Current Outpatient Medications  Medication Sig Dispense Refill  . amoxicillin (AMOXIL) 500 MG capsule Take 2,000 mg by mouth See admin instructions. Take 4 capsules (2000 mg) by mouth 1 hour prior to dental procedures.    Marland Kitchen atorvastatin (LIPITOR) 10 MG tablet TAKE ONE TABLET BY MOUTH DAILY (Patient taking differently: Take 10 mg by mouth daily. ) 90 tablet 3  . doxycycline (VIBRAMYCIN) 100 MG capsule Take 1 capsule (100 mg total) by mouth 2 (two) times daily. 20 capsule 0  . ELIQUIS 5 MG TABS tablet TAKE ONE TABLET BY MOUTH TWICE A DAY (Patient taking differently: Take 5 mg by mouth 2 (two) times daily. ) 180 tablet 1  . hydrochlorothiazide (HYDRODIURIL) 25 MG tablet TAKE ONE TABLET BY MOUTH DAILY *PLEASE KEEP UPCOMING APPOINTMENT IN NOVEMBER WITH DOCTOR TAYLOR BEFORE FURTHER REFILLS* (Patient taking differently: Take 25 mg by mouth daily. ) 90 tablet 3  . ibuprofen (ADVIL) 200 MG tablet Take 400 mg by mouth every 8 (eight) hours as needed (for strenuous exercise (tennis/golf)).    Marland Kitchen lisinopril (ZESTRIL) 20 MG tablet Take 1 tablet (20 mg total) by mouth daily. 90 tablet 2  . tetrahydrozoline 0.05 % ophthalmic solution  Place 1-2 drops into both eyes 3 (three) times daily as needed (dry/irritated eyes.).     No current facility-administered medications for this visit.    Allergies:   Patient has no known allergies.   Social History:  The patient  reports that he quit smoking about 34 years ago. He has a 7.50 pack-year smoking history. He has never used smokeless tobacco. He reports current alcohol use. He reports that he does not use drugs.   Family History:  The patient's family history includes Heart attack in his father; Obesity in his brother; Parkinsonism in his brother; Prostate cancer in his brother.  ROS:  Please see the history of present illness.  All other systems are reviewed and otherwise negative.   PHYSICAL EXAM:  VS:  BP (!) 162/90   Pulse (!) 53    Ht 5\' 11"  (1.803 m)   Wt 199 lb (90.3 kg)   BMI 27.75 kg/m  BMI: Body mass index is 27.75 kg/m. Well nourished, well developed, in no acute distress  HEENT: normocephalic, atraumatic  Neck: no JVD, carotid bruits or masses Cardiac:  irreg-irreg; no significant murmurs, no rubs, or gallops Lungs:   CTA b/l, no wheezing, rhonchi or rales  Abd: soft, nontender MS: no deformity or atrophy Ext: trace edema b/l Skin: warm and dry, no rash Neuro:  No gross deficits appreciated Psych: euthymic mood, full affect    EKG:  Done today and reviewed by myself shows  AFib 53bpm, no changes from prior   04/02/2016: stress myoview  Nuclear stress EF: 62%.  Blood pressure demonstrated a normal response to exercise.  There was no ST segment deviation noted during stress.  Findings consistent with ischemia.  This is a low risk study Small area of mild apical ischemia EF 62%    06/25/2011: LHC  Final Conclusions:   1. No significant coronary artery disease 2. Normal left ventricular systolic function   Recent Labs: No results found for requested labs within last 8760 hours.  No results found for requested labs within last 8760 hours.   CrCl cannot be calculated (Patient's most recent lab result is older than the maximum 21 days allowed.).   Wt Readings from Last 3 Encounters:  07/26/19 199 lb (90.3 kg)  05/31/19 190 lb (86.2 kg)  05/31/19 190 lb (86.2 kg)     Other studies reviewed: Additional studies/records reviewed today include: summarized above  ASSESSMENT AND PLAN:  1. Permanent Afib     CHA2DS2Vasc is 3, on Eliquis, appropriately dosed     Asymptomatic and rate controlled     Due for BMET and CBC, though expect he will be getting these as pre-op, will defer    2. HTN     Unusually elevated for him today     He reports checking BP regularly and typically 130-140/80-85, rarely higher  3. Pre-op, knee surgery     No cardiac contraindication to the suregy planned      Low risk surgery, excellent DIKE score of 8.97METS     OK to hold his Eliquis pre-op 3 days, to resume afterwards as per his surgeon    Disposition: F/u with [place] annually, sooner if needed  Current medicines are reviewed at length with the patient today.  The patient did not have any concerns regarding medicines.  Venetia Night, PA-C 07/26/2019 1:44 PM     [address] [place] [address] [phone] (office)  [phone] (fax)

## 2019-07-28 ENCOUNTER — Ambulatory Visit: Payer: PPO | Admitting: Physician Assistant

## 2019-07-28 NOTE — Patient Instructions (Signed)
DUE TO COVID-19 ONLY ONE VISITOR IS ALLOWED TO COME WITH YOU AND STAY IN THE WAITING ROOM ONLY DURING PRE OP AND PROCEDURE DAY OF SURGERY. THE 2 VISITORS MAY VISIT WITH YOU AFTER SURGERY IN YOUR PRIVATE ROOM DURING VISITING HOURS ONLY!  YOU NEED TO HAVE A COVID 19 TEST ON__4/16/21_____ @__11 :45_____, THIS TEST MUST BE DONE BEFORE SURGERY, COME  Glenn [name] , [address].  ([place]) ONCE YOUR COVID TEST IS COMPLETED, PLEASE BEGIN THE QUARANTINE INSTRUCTIONS AS OUTLINED IN YOUR HANDOUT.                Glenn [name]    Your procedure is scheduled on: 08/09/19   Report to [facility] Main  Entrance   Report to admitting at  11:55 AM     Call this number if you have problems the morning of surgery [phone]   . BRUSH YOUR TEETH MORNING OF SURGERY AND RINSE YOUR MOUTH OUT, NO CHEWING GUM CANDY OR MINTS.  Do not eat food After Midnight.   YOU MAY HAVE CLEAR LIQUIDS FROM MIDNIGHT UNTIL 11:30 AM.    CLEAR LIQUID DIET   Foods Allowed                                                                     Foods Excluded  Coffee and tea, regular and decaf                             liquids that you cannot  Plain Jell-O any favor except red or purple                                           see through such as: Fruit ices (not with fruit pulp)                                     milk, soups, orange juice  Iced Popsicles                                    All solid food Carbonated beverages, regular and diet                                    Cranberry, grape and apple juices Sports drinks like Gatorade Lightly seasoned clear broth or consume(fat free) Sugar, honey syrup     At 11:30 AM Please finish the prescribed Pre-Surgery  Drink.   Nothing by mouth after you finish the  drink !    Take these medicines the morning of surgery with A SIP OF WATER: you may use your eye drops                                 You may not have any metal on your  body including              piercings  Do not wear jewelry,  lotions, powders or  deodorant                    Men may shave face and neck.   Do not bring valuables to the hospital. Glenn [name].  Contacts, dentures or bridgework may not be worn into surgery.       Patients discharged the day of surgery will not be allowed to drive home.   IF YOU ARE HAVING SURGERY AND GOING HOME THE SAME DAY, YOU MUST HAVE AN ADULT TO DRIVE YOU HOME AND BE WITH YOU FOR 24 HOURS . YOU MAY GO HOME BY TAXI OR UBER OR ORTHERWISE, BUT AN ADULT MUST ACCOMPANY YOU HOME AND STAY WITH YOU FOR 24 HOURS.  Name and phone number of your driver:  Special Instructions: N/A              Please read over the following fact sheets you were given: _____________________________________________________________________             [facility] - Preparing for Surgery Before surgery, you can play an important role.   Because skin is not sterile, your skin needs to be as free of germs as possible.   You can reduce the number of germs on your skin by washing with CHG (chlorahexidine gluconate) soap before surgery.   CHG is an antiseptic cleaner which kills germs and bonds with the skin to continue killing germs even after washing. Please DO NOT use if you have an allergy to CHG or antibacterial soaps .  If your skin becomes reddened/irritated stop using the CHG and inform your nurse when you arrive at Short Stay.  You may shave your face/neck.  Please follow these instructions carefully:  1.  Shower with CHG Soap the night before surgery and the  morning of Surgery.  2.  If you choose to wash your hair, wash your hair first as usual with your  normal  shampoo.  3.  After you shampoo, rinse your hair and body thoroughly to remove the  shampoo.                                        4.  Use CHG as you would any other liquid soap.  You can apply chg directly  to the skin and  wash                       Gently with a scrungie or clean washcloth.  5.  Apply the CHG Soap to your body ONLY FROM THE NECK DOWN.   Do not use on face/ open                           Wound or open sores. Avoid contact with eyes, ears mouth and genitals (private parts).                       Wash face,  Genitals (private parts) with your normal soap.             6.  Wash thoroughly, paying special attention to the  area where your surgery  will be performed.  7.  Thoroughly rinse your body with warm water from the neck down.  8.  DO NOT shower/wash with your normal soap after using and rinsing off  the CHG Soap.             9.  Pat yourself dry with a clean towel.            10.  Wear clean pajamas.            11.  Place clean sheets on your bed the night of your first shower and do not  sleep with pets. Day of Surgery : Do not apply any lotions/deodorants the morning of surgery.  Please wear clean clothes to the hospital/surgery center.  FAILURE TO FOLLOW THESE INSTRUCTIONS MAY RESULT IN THE CANCELLATION OF YOUR SURGERY PATIENT SIGNATURE_________________________________  NURSE SIGNATURE__________________________________  ________________________________________________________________________   Glenn [name]  An incentive spirometer is a tool that can help keep your lungs clear and active. This tool measures how well you are filling your lungs with each breath. Taking long deep breaths may help reverse or decrease the chance of developing breathing (pulmonary) problems (especially infection) following:  A long period of time when you are unable to move or be active. BEFORE THE PROCEDURE   If the spirometer includes an indicator to show your best effort, your nurse or respiratory therapist will set it to a desired goal.  If possible, sit up straight or lean slightly forward. Try not to slouch.  Hold the incentive spirometer in an upright position. INSTRUCTIONS FOR USE   1. Sit on the edge of your bed if possible, or sit up as far as you can in bed or on a chair. 2. Hold the incentive spirometer in an upright position. 3. Breathe out normally. 4. Place the mouthpiece in your mouth and seal your lips tightly around it. 5. Breathe in slowly and as deeply as possible, raising the piston or the ball toward the top of the column. 6. Hold your breath for 3-5 seconds or for as long as possible. Allow the piston or ball to fall to the bottom of the column. 7. Remove the mouthpiece from your mouth and breathe out normally. 8. Rest for a few seconds and repeat Steps 1 through 7 at least 10 times every 1-2 hours when you are awake. Take your time and take a few normal breaths between deep breaths. 9. The spirometer may include an indicator to show your best effort. Use the indicator as a goal to work toward during each repetition. 10. After each set of 10 deep breaths, practice coughing to be sure your lungs are clear. If you have an incision (the cut made at the time of surgery), support your incision when coughing by placing a pillow or rolled up towels firmly against it. Once you are able to get out of bed, walk around indoors and cough well. You may stop using the incentive spirometer when instructed by your caregiver.  RISKS AND COMPLICATIONS  Take your time so you do not get dizzy or light-headed.  If you are in pain, you may need to take or ask for pain medication before doing incentive spirometry. It is harder to take a deep breath if you are having pain. AFTER USE  Rest and breathe slowly and easily.  It can be helpful to keep track of a log of your progress. Your caregiver can provide you with a simple table to help  with this. If you are using the spirometer at home, follow these instructions: [place] IF:   You are having difficultly using the spirometer.  You have trouble using the spirometer as often as instructed.  Your pain medication is  not giving enough relief while using the spirometer.  You develop fever of 100.5 F (38.1 C) or higher. SEEK IMMEDIATE MEDICAL CARE IF:   You cough up bloody sputum that had not been present before.  You develop fever of 102 F (38.9 C) or greater.  You develop worsening pain at or near the incision site. MAKE SURE YOU:   Understand these instructions.  Will watch your condition.  Will get help right away if you are not doing well or get worse. Document Released: 08/18/2006 Document Revised: 06/30/2011 Document Reviewed: 10/19/2006 [facility] Patient Information 2014 [place], [place].   ________________________________________________________________________

## 2019-07-29 ENCOUNTER — Encounter (HOSPITAL_COMMUNITY)
Admission: RE | Admit: 2019-07-29 | Discharge: 2019-07-29 | Disposition: A | Discharge status: Home / Self Care | Payer: PPO | Source: Ambulatory Visit | Attending: Orthopedic Surgery | Admitting: Orthopedic Surgery

## 2019-07-29 ENCOUNTER — Encounter (HOSPITAL_COMMUNITY): Payer: Self-pay

## 2019-07-29 ENCOUNTER — Other Ambulatory Visit: Payer: Self-pay

## 2019-07-29 DIAGNOSIS — Z8546 Personal history of malignant neoplasm of prostate: Secondary | ICD-10-CM | POA: Insufficient documentation

## 2019-07-29 DIAGNOSIS — I4891 Unspecified atrial fibrillation: Secondary | ICD-10-CM | POA: Diagnosis not present

## 2019-07-29 DIAGNOSIS — Z79899 Other long term (current) drug therapy: Secondary | ICD-10-CM | POA: Insufficient documentation

## 2019-07-29 DIAGNOSIS — Z87891 Personal history of nicotine dependence: Secondary | ICD-10-CM | POA: Insufficient documentation

## 2019-07-29 DIAGNOSIS — M1711 Unilateral primary osteoarthritis, right knee: Secondary | ICD-10-CM | POA: Insufficient documentation

## 2019-07-29 DIAGNOSIS — I10 Essential (primary) hypertension: Secondary | ICD-10-CM | POA: Insufficient documentation

## 2019-07-29 DIAGNOSIS — Z01818 Encounter for other preprocedural examination: Secondary | ICD-10-CM | POA: Diagnosis not present

## 2019-07-29 DIAGNOSIS — Z7901 Long term (current) use of anticoagulants: Secondary | ICD-10-CM | POA: Insufficient documentation

## 2019-07-29 LAB — CBC WITH DIFFERENTIAL/PLATELET
Abs Immature Granulocytes: 0.03 10*3/uL (ref 0.00–0.07)
Basophils Absolute: 0.1 10*3/uL (ref 0.0–0.1)
Basophils Relative: 1 %
Eosinophils Absolute: 0.1 10*3/uL (ref 0.0–0.5)
Eosinophils Relative: 1 %
HCT: 44.6 % (ref 39.0–52.0)
Hemoglobin: 14.6 g/dL (ref 13.0–17.0)
Immature Granulocytes: 0 %
Lymphocytes Relative: 19 %
Lymphs Abs: 1.7 10*3/uL (ref 0.7–4.0)
MCH: 30.8 pg (ref 26.0–34.0)
MCHC: 32.7 g/dL (ref 30.0–36.0)
MCV: 94.1 fL (ref 80.0–100.0)
Monocytes Absolute: 0.5 10*3/uL (ref 0.1–1.0)
Monocytes Relative: 5 %
Neutro Abs: 6.5 10*3/uL (ref 1.7–7.7)
Neutrophils Relative %: 74 %
Platelets: 193 10*3/uL (ref 150–400)
RBC: 4.74 MIL/uL (ref 4.22–5.81)
RDW: 13.8 % (ref 11.5–15.5)
WBC: 8.9 10*3/uL (ref 4.0–10.5)
nRBC: 0 % (ref 0.0–0.2)

## 2019-07-29 LAB — COMPREHENSIVE METABOLIC PANEL
ALT: 23 U/L (ref 0–44)
AST: 24 U/L (ref 15–41)
Albumin: 3.8 g/dL (ref 3.5–5.0)
Alkaline Phosphatase: 66 U/L (ref 38–126)
Anion gap: 10 (ref 5–15)
BUN: 23 mg/dL (ref 8–23)
CO2: 24 mmol/L (ref 22–32)
Calcium: 8.8 mg/dL — ABNORMAL LOW (ref 8.9–10.3)
Chloride: 107 mmol/L (ref 98–111)
Creatinine, Ser: 1.17 mg/dL (ref 0.61–1.24)
GFR calc Af Amer: >60 mL/min (ref >60)
GFR calc non Af Amer: 59 mL/min — ABNORMAL LOW (ref >60)
Glucose, Bld: 150 mg/dL — ABNORMAL HIGH (ref 70–99)
Potassium: 3.6 mmol/L (ref 3.5–5.1)
Sodium: 141 mmol/L (ref 135–145)
Total Bilirubin: 1 mg/dL (ref 0.3–1.2)
Total Protein: 6.7 g/dL (ref 6.5–8.1)

## 2019-07-29 LAB — SURGICAL PCR SCREEN
MRSA, PCR: NEGATIVE
Staphylococcus aureus: POSITIVE — AB

## 2019-07-29 LAB — APTT: aPTT: 34 seconds (ref 24–36)

## 2019-07-29 LAB — PROTIME-INR
INR: 1.2 (ref 0.8–1.2)
Prothrombin Time: 15.3 seconds — ABNORMAL HIGH (ref 11.4–15.2)

## 2019-07-29 NOTE — Progress Notes (Signed)
PCP - Dr. Dante Gang Cardiologist - Dr. Beckie Salts  Chest x-Acxel - no EKG - 07/26/19 Stress Test -2017  ECHO - no Cardiac Cath - no  Sleep Study - no CPAP -   Fasting Blood Sugar - NA Checks Blood Sugar _____ times a day  Blood Thinner Instructions:Eliquis Aspirin Instructions:Stop 3 days prior Last Dose:08/06/18  Anesthesia review:   Patient denies shortness of breath, fever, cough and chest pain at PAT appointment  yes Patient verbalized understanding of instructions that were given to them at the PAT appointment. Patient was also instructed that they will need to review over the PAT instructions again at home before surgery. yes

## 2019-07-29 NOTE — H&P (Signed)
TOTAL KNEE ADMISSION H&P  Patient is being admitted for right total knee arthroplasty.  Subjective:  Chief Complaint:   Right knee primary OA / pain  HPI: Glenn [name], 79 y.o. male, has a history of pain and functional disability in the right knee due to arthritis and has failed non-surgical conservative treatments for greater than 12 weeks to include NSAID's and/or analgesics, corticosteriod injections and activity modification.  Onset of symptoms was gradual, starting 2+ years ago with gradually worsening course since that time. The patient noted no past surgery on the right knee(s).  Patient currently rates pain in the right knee(s) at 8 out of 10 with activity. Patient has worsening of pain with activity and weight bearing, pain that interferes with activities of daily living, pain with passive range of motion, crepitus and joint swelling.  Patient has evidence of periarticular osteophytes and joint space narrowing by imaging studies.  There is no active infection.  Risks, benefits and expectations were discussed with the patient.  Risks including but not limited to the risk of anesthesia, blood clots, nerve damage, blood vessel damage, failure of the prosthesis, infection and up to and including death.  Patient understand the risks, benefits and expectations and wishes to proceed with surgery.   PCP: Alroy Dust, L.Marlou Sa, MD  D/C Plans:       Home   Post-op Meds:       No Rx given   Tranexamic Acid:      To be given - IV   Decadron:      Is to be given  FYI:      Eliquis (on pre-op)  Norco  DME:   Pt already has equipment   PT:   [place]: Ammie Ferrier    Patient Active Problem List   Diagnosis Date Noted  . Bilateral knee pain 04/22/2017  . History of total knee replacement, left 04/22/2017  . Osteoarthritis of knee 04/22/2017  . Dizziness 12/21/2012  . ATRIAL FIBRILLATION 05/31/2010  . NEOPLASM OF UNCERTAIN BEHAVIOR OF SKIN 06/17/2007  . HYPERTENSION,  BENIGN 04/29/2007  . RHINITIS 04/29/2007  . COUGH, CHRONIC 04/29/2007  . SKIN CANCER 06/18/2006  . PROSTATE CANCER 06/18/2006  . OSTEOARTHRITIS, LOWER LEG 06/18/2006  . OSTEOARTHRITIS OF SPINE, NOS 06/18/2006   Past Medical History:  Diagnosis Date  . Atrial fibrillation ([place])    takes aspirin for this  . Brachial plexus injury 1995   Left arm  . Dysrhythmia 2009   A-fib  . HTN (hypertension)    6 years  . Injury of brachial plexus   . Prostate cancer ([place])   . Prostate cancer ([place])   . Torn rotator cuff   . Torn rotator cuff 2000   Right arm    Past Surgical History:  Procedure Laterality Date  . ETT  2006   High fitness but bp of 255  . Int fixation left forearm  01/19/1989  . KNEE SURGERY     left  . PROSTATECTOMY    . removal of basal cell    . TONSILLECTOMY      No current facility-administered medications for this encounter.   Current Outpatient Medications  Medication Sig Dispense Refill Last Dose  . atorvastatin (LIPITOR) 10 MG tablet TAKE ONE TABLET BY MOUTH DAILY (Patient taking differently: Take 10 mg by mouth daily. ) 90 tablet 3   . ELIQUIS 5 MG TABS tablet TAKE ONE TABLET BY MOUTH TWICE A DAY (Patient taking differently: Take  5 mg by mouth 2 (two) times daily. ) 180 tablet 1   . hydrochlorothiazide (HYDRODIURIL) 25 MG tablet TAKE ONE TABLET BY MOUTH DAILY *PLEASE KEEP UPCOMING APPOINTMENT IN NOVEMBER WITH DOCTOR TAYLOR BEFORE FURTHER REFILLS* (Patient taking differently: Take 25 mg by mouth daily. ) 90 tablet 3   . ibuprofen (ADVIL) 200 MG tablet Take 400 mg by mouth every 8 (eight) hours as needed (for strenuous exercise (tennis/golf)).     Marland Kitchen lisinopril (ZESTRIL) 20 MG tablet Take 1 tablet (20 mg total) by mouth daily. 90 tablet 2   . tetrahydrozoline 0.05 % ophthalmic solution Place 1-2 drops into both eyes 3 (three) times daily as needed (dry/irritated eyes.).     Marland Kitchen amoxicillin (AMOXIL) 500 MG capsule Take 2,000 mg by mouth See admin instructions. Take 4  capsules (2000 mg) by mouth 1 hour prior to dental procedures.     . doxycycline (VIBRAMYCIN) 100 MG capsule Take 1 capsule (100 mg total) by mouth 2 (two) times daily. 20 capsule 0 Not Taking at Unknown time   No Known Allergies   Social History   Tobacco Use  . Smoking status: Former Smoker    Packs/day: 0.50    Years: 15.00    Pack years: 7.50    Quit date: 04/21/1985    Years since quitting: 34.2  . Smokeless tobacco: Never Used  Substance Use Topics  . Alcohol use: Yes    Comment: Social    Family History  Problem Relation Age of Onset  . Heart attack Father        MI  . Obesity Brother   . Parkinsonism Brother   . Prostate cancer Brother   . Colon cancer Neg Hx   . Esophageal cancer Neg Hx   . Stomach cancer Neg Hx   . Rectal cancer Neg Hx      Review of Systems  Constitutional: Negative.   HENT: Negative.   Eyes: Negative.   Respiratory: Negative.   Cardiovascular: Negative.   Gastrointestinal: Negative.   Genitourinary: Negative.   Musculoskeletal: Positive for joint pain.  Skin: Negative.   Neurological: Negative.   Endo/Heme/Allergies: Negative.   Psychiatric/Behavioral: Negative.       Objective:  Physical Exam  Constitutional: He is oriented to person, place, and time. He appears well-developed.  HENT:  Head: Normocephalic.  Eyes: Pupils are equal, round, and reactive to light.  Neck: No JVD present. No tracheal deviation present. No thyromegaly present.  Cardiovascular: Normal rate, regular rhythm and intact distal pulses.  A-fib  Respiratory: Effort normal and breath sounds normal. No respiratory distress. He has no wheezes.  GI: Soft. There is no abdominal tenderness. There is no guarding.  Musculoskeletal:     Cervical back: Neck supple.     Right knee: Swelling and bony tenderness present. No deformity, erythema, ecchymosis or lacerations. Decreased range of motion. Tenderness present.  Lymphadenopathy:    He has no cervical adenopathy.   Neurological: He is alert and oriented to person, place, and time.  Skin: Skin is warm and dry.  Psychiatric: He has a normal mood and affect.      Labs:  Estimated body mass index is 27.75 kg/m as calculated from the following:   Height as of 07/26/19: 5\' 11"  (1.803 m).   Weight as of 07/26/19: 90.3 kg.   Imaging Review Plain radiographs demonstrate severe degenerative joint disease of the right knee(s). The bone quality appears to be good for age and reported activity level.  Assessment/Plan:  End stage arthritis, right knee   The patient history, physical examination, clinical judgment of the provider and imaging studies are consistent with end stage degenerative joint disease of the right knee(s) and total knee arthroplasty is deemed medically necessary. The treatment options including medical management, injection therapy arthroscopy and arthroplasty were discussed at length. The risks and benefits of total knee arthroplasty were presented and reviewed. The risks due to aseptic loosening, infection, stiffness, patella tracking problems, thromboembolic complications and other imponderables were discussed. The patient acknowledged the explanation, agreed to proceed with the plan and consent was signed. Patient is being admitted for treatment for surgery, pain control, PT, OT, prophylactic antibiotics, VTE prophylaxis, progressive ambulation and ADL's and discharge planning. The patient is planning to be discharged home.      Patient's anticipated LOS is less than 2 midnights, meeting these requirements: - Lives within 1 hour of care - Has a competent adult at home to recover with post-op recover - NO history of  - Chronic pain requiring opiods  - Diabetes  - Coronary Artery Disease  - Heart failure  - Heart attack  - Stroke  - DVT/VTE  - Respiratory Failure/COPD  - Renal failure  - Anemia  - Advanced Liver disease

## 2019-07-30 LAB — TYPE AND SCREEN
ABO/RH(D): A POS
Antibody Screen: NEGATIVE

## 2019-08-01 NOTE — Progress Notes (Signed)
Anesthesia Chart Review   Case: [id number] Date/Time: 08/09/19 0700   Procedure: TOTAL KNEE ARTHROPLASTY (Right Knee) - 70 mins   Anesthesia type: Spinal   Pre-op diagnosis: Right knee osteoarthritis   Location: [facility] ROOM 09 / [facility]   Surgeons: Paralee Cancel, MD      DISCUSSION:79 y.o. former smoker (7.5 pack years, quit 04/21/85) with h/o HTN, A-fib (on Eliquis), prostate cancer, right knee OA scheduled for above procedure 08/09/2019 with Dr. Paralee Cancel.   Pt last seen by EP 07/26/2019.  Per OV note, "No cardiac contraindication to the surgery planned.  Low risk surgery, excellent DIKE score of 8.97METS.  OK to hold his Eliquis pre-op 3 days, to resume afterwards as per his surgeon."  Anticipate pt can proceed with planned procedure barring acute status change.   VS: BP (!) 141/75   Pulse 68   Temp (!) 36.4 C   Resp 16   Ht 5\' 11"  (1.803 m)   Wt 91.2 kg   SpO2 97%   BMI 28.03 kg/m   PROVIDERS: Mitchell, L.Marlou Sa, MD is PCP   Cristopher Peru, MD is Cardiologist  LABS: Labs reviewed: Acceptable for surgery. (all labs ordered are listed, but only abnormal results are displayed)  Labs Reviewed  SURGICAL PCR SCREEN - Abnormal; Notable for the following components:      Result Value   Staphylococcus aureus POSITIVE (*)    All other components within normal limits  COMPREHENSIVE METABOLIC PANEL - Abnormal; Notable for the following components:   Glucose, Bld 150 (*)    Calcium 8.8 (*)    GFR calc non Af Amer 59 (*)    All other components within normal limits  PROTIME-INR - Abnormal; Notable for the following components:   Prothrombin Time 15.3 (*)    All other components within normal limits  APTT  CBC WITH DIFFERENTIAL/PLATELET  TYPE AND SCREEN     IMAGES:   EKG: 07/26/2019 Rate 53 bpm Afib  CV: Myocardial Perfusion 04/02/2016  Nuclear stress EF: 62%.  Blood pressure demonstrated a normal response to exercise.  There was no ST segment deviation noted during  stress.  Findings consistent with ischemia.  This is a low risk study.   Small area of mild apical ischemia EF 62%  Past Medical History:  Diagnosis Date  . Atrial fibrillation ([place])    takes aspirin for this  . Brachial plexus injury 1995   Left arm  . Dysrhythmia 2009   A-fib  . HTN (hypertension)    6 years  . Injury of brachial plexus   . Prostate cancer ([place])   . Prostate cancer ([place])   . Torn rotator cuff   . Torn rotator cuff 2000   Right arm    Past Surgical History:  Procedure Laterality Date  . ETT  2006   High fitness but bp of 255  . Int fixation left forearm  01/19/1989  . KNEE SURGERY     left  . PROSTATECTOMY    . removal of basal cell    . TONSILLECTOMY      MEDICATIONS: . amoxicillin (AMOXIL) 500 MG capsule  . atorvastatin (LIPITOR) 10 MG tablet  . doxycycline (VIBRAMYCIN) 100 MG capsule  . ELIQUIS 5 MG TABS tablet  . hydrochlorothiazide (HYDRODIURIL) 25 MG tablet  . ibuprofen (ADVIL) 200 MG tablet  . lisinopril (ZESTRIL) 20 MG tablet  . tetrahydrozoline 0.05 % ophthalmic solution   No current facility-administered medications for this encounter.    Konrad Felix,  PA-C [facility] [phone] 08/01/19  4:33 PM

## 2019-08-05 ENCOUNTER — Other Ambulatory Visit (HOSPITAL_COMMUNITY)
Admission: RE | Admit: 2019-08-05 | Discharge: 2019-08-05 | Disposition: A | Discharge status: Home / Self Care | Payer: PPO | Source: Ambulatory Visit | Attending: Orthopedic Surgery | Admitting: Orthopedic Surgery

## 2019-08-05 DIAGNOSIS — Z01812 Encounter for preprocedural laboratory examination: Secondary | ICD-10-CM | POA: Insufficient documentation

## 2019-08-05 DIAGNOSIS — Z20822 Contact with and (suspected) exposure to covid-19: Secondary | ICD-10-CM | POA: Insufficient documentation

## 2019-08-05 LAB — SARS CORONAVIRUS 2 (TAT 6-24 HRS): SARS Coronavirus 2: NEGATIVE

## 2019-08-08 ENCOUNTER — Encounter (HOSPITAL_COMMUNITY): Payer: Self-pay | Admitting: Orthopedic Surgery

## 2019-08-08 NOTE — Anesthesia Preprocedure Evaluation (Addendum)
Anesthesia Evaluation  Patient identified by MRN, date of birth, ID band Patient awake    Reviewed: Allergy & Precautions, NPO status , Patient's Chart, lab work & pertinent test results  Airway Mallampati: III  TM Distance: >3 FB Neck ROM: Full    Dental no notable dental hx.    Pulmonary former smoker,    Pulmonary exam normal breath sounds clear to auscultation       Cardiovascular hypertension, Pt. on medications Normal cardiovascular exam+ dysrhythmias Atrial Fibrillation  Rhythm:Regular Rate:Normal  ECG: a-fib, rate 53   Neuro/Psych negative neurological ROS  negative psych ROS   GI/Hepatic negative GI ROS, Neg liver ROS,   Endo/Other  negative endocrine ROS  Renal/GU negative Renal ROS     Musculoskeletal  (+) Arthritis , Osteoarthritis,    Abdominal   Peds  Hematology HLD   Anesthesia Other Findings Right knee osteoarthritis  Reproductive/Obstetrics                            Anesthesia Physical Anesthesia Plan  ASA: III  Anesthesia Plan: Spinal and Regional   Post-op Pain Management:    Induction: Intravenous  PONV Risk Score and Plan: 1 and Ondansetron, Dexamethasone, Propofol infusion and Treatment may vary due to age or medical condition  Airway Management Planned: Simple Face Mask  Additional Equipment:   Intra-op Plan:   Post-operative Plan:   Informed Consent: I have reviewed the patients History and Physical, chart, labs and discussed the procedure including the risks, benefits and alternatives for the proposed anesthesia with the patient or authorized representative who has indicated his/her understanding and acceptance.     Dental advisory given  Plan Discussed with: CRNA  Anesthesia Plan Comments:        Anesthesia Quick Evaluation

## 2019-08-09 ENCOUNTER — Ambulatory Visit (HOSPITAL_COMMUNITY): Payer: PPO | Admitting: Anesthesiology

## 2019-08-09 ENCOUNTER — Observation Stay (HOSPITAL_COMMUNITY)
Admission: RE | Admit: 2019-08-09 | Discharge: 2019-08-10 | Disposition: A | Discharge status: Home / Self Care | Payer: PPO | Source: Ambulatory Visit | Attending: Orthopedic Surgery | Admitting: Orthopedic Surgery

## 2019-08-09 ENCOUNTER — Ambulatory Visit (HOSPITAL_COMMUNITY): Payer: PPO | Admitting: Physician Assistant

## 2019-08-09 ENCOUNTER — Other Ambulatory Visit: Payer: Self-pay

## 2019-08-09 ENCOUNTER — Encounter (HOSPITAL_COMMUNITY): Payer: Self-pay | Admitting: Orthopedic Surgery

## 2019-08-09 ENCOUNTER — Encounter (HOSPITAL_COMMUNITY)
Admission: RE | Disposition: A | Discharge status: Home / Self Care | Payer: Self-pay | Source: Ambulatory Visit | Attending: Orthopedic Surgery

## 2019-08-09 DIAGNOSIS — Z6825 Body mass index (BMI) 25.0-25.9, adult: Secondary | ICD-10-CM | POA: Diagnosis not present

## 2019-08-09 DIAGNOSIS — E663 Overweight: Secondary | ICD-10-CM | POA: Diagnosis not present

## 2019-08-09 DIAGNOSIS — Z7901 Long term (current) use of anticoagulants: Secondary | ICD-10-CM | POA: Insufficient documentation

## 2019-08-09 DIAGNOSIS — E785 Hyperlipidemia, unspecified: Secondary | ICD-10-CM | POA: Diagnosis not present

## 2019-08-09 DIAGNOSIS — M25461 Effusion, right knee: Secondary | ICD-10-CM | POA: Diagnosis not present

## 2019-08-09 DIAGNOSIS — I10 Essential (primary) hypertension: Secondary | ICD-10-CM | POA: Diagnosis not present

## 2019-08-09 DIAGNOSIS — Z87891 Personal history of nicotine dependence: Secondary | ICD-10-CM | POA: Diagnosis not present

## 2019-08-09 DIAGNOSIS — Z9079 Acquired absence of other genital organ(s): Secondary | ICD-10-CM | POA: Insufficient documentation

## 2019-08-09 DIAGNOSIS — I4891 Unspecified atrial fibrillation: Secondary | ICD-10-CM | POA: Insufficient documentation

## 2019-08-09 DIAGNOSIS — Z79899 Other long term (current) drug therapy: Secondary | ICD-10-CM | POA: Insufficient documentation

## 2019-08-09 DIAGNOSIS — M1711 Unilateral primary osteoarthritis, right knee: Principal | ICD-10-CM | POA: Insufficient documentation

## 2019-08-09 DIAGNOSIS — Z8249 Family history of ischemic heart disease and other diseases of the circulatory system: Secondary | ICD-10-CM | POA: Insufficient documentation

## 2019-08-09 DIAGNOSIS — Z8546 Personal history of malignant neoplasm of prostate: Secondary | ICD-10-CM | POA: Diagnosis not present

## 2019-08-09 DIAGNOSIS — Z96651 Presence of right artificial knee joint: Secondary | ICD-10-CM

## 2019-08-09 DIAGNOSIS — M25761 Osteophyte, right knee: Secondary | ICD-10-CM | POA: Diagnosis not present

## 2019-08-09 DIAGNOSIS — Z7982 Long term (current) use of aspirin: Secondary | ICD-10-CM | POA: Insufficient documentation

## 2019-08-09 DIAGNOSIS — G8918 Other acute postprocedural pain: Secondary | ICD-10-CM | POA: Diagnosis not present

## 2019-08-09 HISTORY — PX: TOTAL KNEE ARTHROPLASTY: SHX125

## 2019-08-09 LAB — HEMOGLOBIN A1C
Hgb A1c MFr Bld: 6 % — ABNORMAL HIGH (ref 4.8–5.6)
Mean Plasma Glucose: 125.5 mg/dL

## 2019-08-09 SURGERY — ARTHROPLASTY, KNEE, TOTAL
Anesthesia: Regional | Site: Knee | Laterality: Right

## 2019-08-09 MED ORDER — HYDROCODONE-ACETAMINOPHEN 7.5-325 MG PO TABS: 1.0000 | ORAL_TABLET | ORAL | Status: DC | PRN

## 2019-08-09 MED ORDER — KETOROLAC TROMETHAMINE 30 MG/ML IJ SOLN
INTRAMUSCULAR | Status: AC
  Filled 2019-08-09: qty 1

## 2019-08-09 MED ORDER — SODIUM CHLORIDE (PF) 0.9 % IJ SOLN
INTRAMUSCULAR | Status: DC | PRN
  Administered 2019-08-09: 30 mL

## 2019-08-09 MED ORDER — CELECOXIB 200 MG PO CAPS
200.0000 mg | ORAL_CAPSULE | Freq: Two times a day (BID) | ORAL | Status: DC
  Administered 2019-08-09 (×2): 200 mg via ORAL
  Filled 2019-08-09 (×2): qty 1

## 2019-08-09 MED ORDER — ONDANSETRON HCL 4 MG/2ML IJ SOLN: 4.0000 mg | Freq: Four times a day (QID) | INTRAMUSCULAR | Status: DC | PRN

## 2019-08-09 MED ORDER — BUPIVACAINE HCL (PF) 0.25 % IJ SOLN
INTRAMUSCULAR | Status: AC
  Filled 2019-08-09: qty 30

## 2019-08-09 MED ORDER — DEXAMETHASONE SODIUM PHOSPHATE 10 MG/ML IJ SOLN
INTRAMUSCULAR | Status: AC
  Filled 2019-08-09: qty 3

## 2019-08-09 MED ORDER — ATORVASTATIN CALCIUM 10 MG PO TABS
10.0000 mg | ORAL_TABLET | Freq: Every day | ORAL | Status: DC
  Administered 2019-08-09 – 2019-08-10 (×2): 10 mg via ORAL
  Filled 2019-08-09 (×2): qty 1

## 2019-08-09 MED ORDER — FERROUS SULFATE 325 (65 FE) MG PO TABS
325.0000 mg | ORAL_TABLET | Freq: Two times a day (BID) | ORAL | Status: DC
  Administered 2019-08-10: 08:00:00 325 mg via ORAL
  Filled 2019-08-09: qty 1

## 2019-08-09 MED ORDER — GLYCOPYRROLATE PF 0.2 MG/ML IJ SOSY
PREFILLED_SYRINGE | INTRAMUSCULAR | Status: DC | PRN
  Administered 2019-08-09: .2 mg via INTRAVENOUS

## 2019-08-09 MED ORDER — PHENYLEPHRINE HCL-NACL 10-0.9 MG/250ML-% IV SOLN
INTRAVENOUS | Status: DC | PRN
  Administered 2019-08-09: 35 ug/min via INTRAVENOUS

## 2019-08-09 MED ORDER — FENTANYL CITRATE (PF) 100 MCG/2ML IJ SOLN
INTRAMUSCULAR | Status: AC
  Filled 2019-08-09: qty 2

## 2019-08-09 MED ORDER — PHENOL 1.4 % MT LIQD: 1.0000 | OROMUCOSAL | Status: DC | PRN

## 2019-08-09 MED ORDER — ACETAMINOPHEN 325 MG PO TABS
325.0000 mg | ORAL_TABLET | Freq: Four times a day (QID) | ORAL | Status: DC | PRN
  Filled 2019-08-09: qty 2

## 2019-08-09 MED ORDER — BISACODYL 10 MG RE SUPP: 10.0000 mg | Freq: Every day | RECTAL | Status: DC | PRN

## 2019-08-09 MED ORDER — MIDAZOLAM HCL 5 MG/5ML IJ SOLN
INTRAMUSCULAR | Status: DC | PRN
  Administered 2019-08-09 (×2): 1 mg via INTRAVENOUS

## 2019-08-09 MED ORDER — MAGNESIUM CITRATE PO SOLN: 1.0000 | Freq: Once | ORAL | Status: DC | PRN

## 2019-08-09 MED ORDER — METHOCARBAMOL 500 MG IVPB - SIMPLE MED
INTRAVENOUS | Status: AC
  Filled 2019-08-09: qty 50

## 2019-08-09 MED ORDER — LIDOCAINE 2% (20 MG/ML) 5 ML SYRINGE
INTRAMUSCULAR | Status: AC
  Filled 2019-08-09: qty 15

## 2019-08-09 MED ORDER — SODIUM CHLORIDE (PF) 0.9 % IJ SOLN
INTRAMUSCULAR | Status: AC
  Filled 2019-08-09: qty 50

## 2019-08-09 MED ORDER — POLYETHYLENE GLYCOL 3350 17 G PO PACK
17.0000 g | PACK | Freq: Two times a day (BID) | ORAL | Status: DC
  Administered 2019-08-09 – 2019-08-10 (×2): 17 g via ORAL
  Filled 2019-08-09 (×2): qty 1

## 2019-08-09 MED ORDER — PROPOFOL 10 MG/ML IV BOLUS
INTRAVENOUS | Status: DC | PRN
  Administered 2019-08-09 (×2): 20 mg via INTRAVENOUS
  Administered 2019-08-09: 180 mg via INTRAVENOUS

## 2019-08-09 MED ORDER — HYDROMORPHONE HCL 1 MG/ML IJ SOLN: 0.5000 mg | INTRAMUSCULAR | Status: DC | PRN

## 2019-08-09 MED ORDER — ACETAMINOPHEN 500 MG PO TABS
1000.0000 mg | ORAL_TABLET | Freq: Once | ORAL | Status: AC
  Administered 2019-08-09: 1000 mg via ORAL
  Filled 2019-08-09: qty 2

## 2019-08-09 MED ORDER — PHENYLEPHRINE HCL-NACL 10-0.9 MG/250ML-% IV SOLN
INTRAVENOUS | Status: AC
  Filled 2019-08-09: qty 750

## 2019-08-09 MED ORDER — DEXAMETHASONE SODIUM PHOSPHATE 10 MG/ML IJ SOLN
INTRAMUSCULAR | Status: DC | PRN
  Administered 2019-08-09: 8 mg via INTRAVENOUS

## 2019-08-09 MED ORDER — ONDANSETRON HCL 4 MG/2ML IJ SOLN
INTRAMUSCULAR | Status: AC
  Filled 2019-08-09: qty 6

## 2019-08-09 MED ORDER — KETOROLAC TROMETHAMINE 30 MG/ML IJ SOLN
INTRAMUSCULAR | Status: DC | PRN
  Administered 2019-08-09: 30 mg

## 2019-08-09 MED ORDER — ALUM & MAG HYDROXIDE-SIMETH 200-200-20 MG/5ML PO SUSP: 15.0000 mL | ORAL | Status: DC | PRN

## 2019-08-09 MED ORDER — MENTHOL 3 MG MT LOZG
1.0000 | LOZENGE | OROMUCOSAL | Status: DC | PRN
  Administered 2019-08-10: 04:00:00 3 mg via ORAL
  Filled 2019-08-09: qty 9

## 2019-08-09 MED ORDER — EPHEDRINE 5 MG/ML INJ
INTRAVENOUS | Status: AC
  Filled 2019-08-09: qty 10

## 2019-08-09 MED ORDER — SODIUM CHLORIDE 0.9 % IR SOLN
Status: DC | PRN
  Administered 2019-08-09: 1000 mL

## 2019-08-09 MED ORDER — HYDROMORPHONE HCL 1 MG/ML IJ SOLN
INTRAMUSCULAR | Status: AC
  Filled 2019-08-09: qty 1

## 2019-08-09 MED ORDER — EPHEDRINE SULFATE-NACL 50-0.9 MG/10ML-% IV SOSY
PREFILLED_SYRINGE | INTRAVENOUS | Status: DC | PRN
  Administered 2019-08-09: 10 mg via INTRAVENOUS

## 2019-08-09 MED ORDER — PROPOFOL 10 MG/ML IV BOLUS
INTRAVENOUS | Status: AC
  Filled 2019-08-09: qty 40

## 2019-08-09 MED ORDER — PROPOFOL 1000 MG/100ML IV EMUL
INTRAVENOUS | Status: AC
  Filled 2019-08-09: qty 100

## 2019-08-09 MED ORDER — DEXAMETHASONE SODIUM PHOSPHATE 10 MG/ML IJ SOLN
10.0000 mg | Freq: Once | INTRAMUSCULAR | Status: AC
  Administered 2019-08-10: 08:00:00 10 mg via INTRAVENOUS
  Filled 2019-08-09: qty 1

## 2019-08-09 MED ORDER — METOCLOPRAMIDE HCL 5 MG PO TABS: 5.0000 mg | ORAL_TABLET | Freq: Three times a day (TID) | ORAL | Status: DC | PRN

## 2019-08-09 MED ORDER — MIDAZOLAM HCL 2 MG/2ML IJ SOLN
INTRAMUSCULAR | Status: AC
  Filled 2019-08-09: qty 2

## 2019-08-09 MED ORDER — BUPIVACAINE HCL (PF) 0.25 % IJ SOLN
INTRAMUSCULAR | Status: DC | PRN
  Administered 2019-08-09: 30 mL

## 2019-08-09 MED ORDER — METHOCARBAMOL 500 MG IVPB - SIMPLE MED
500.0000 mg | Freq: Four times a day (QID) | INTRAVENOUS | Status: DC | PRN
  Administered 2019-08-09: 500 mg via INTRAVENOUS
  Filled 2019-08-09: qty 50

## 2019-08-09 MED ORDER — METOCLOPRAMIDE HCL 5 MG/ML IJ SOLN: 5.0000 mg | Freq: Three times a day (TID) | INTRAMUSCULAR | Status: DC | PRN

## 2019-08-09 MED ORDER — TRANEXAMIC ACID-NACL 1000-0.7 MG/100ML-% IV SOLN
1000.0000 mg | INTRAVENOUS | Status: AC
  Administered 2019-08-09: 1000 mg via INTRAVENOUS
  Filled 2019-08-09: qty 100

## 2019-08-09 MED ORDER — HYDROCHLOROTHIAZIDE 25 MG PO TABS
25.0000 mg | ORAL_TABLET | Freq: Every day | ORAL | Status: DC
  Administered 2019-08-10: 25 mg via ORAL
  Filled 2019-08-09: qty 1

## 2019-08-09 MED ORDER — SODIUM CHLORIDE 0.9 % IV SOLN: INTRAVENOUS | Status: DC

## 2019-08-09 MED ORDER — STERILE WATER FOR IRRIGATION IR SOLN
Status: DC | PRN
  Administered 2019-08-09 (×2): 1000 mL

## 2019-08-09 MED ORDER — LACTATED RINGERS IV SOLN: INTRAVENOUS | Status: DC

## 2019-08-09 MED ORDER — DOCUSATE SODIUM 100 MG PO CAPS
100.0000 mg | ORAL_CAPSULE | Freq: Two times a day (BID) | ORAL | Status: DC
  Administered 2019-08-09 – 2019-08-10 (×2): 100 mg via ORAL
  Filled 2019-08-09 (×2): qty 1

## 2019-08-09 MED ORDER — DIPHENHYDRAMINE HCL 12.5 MG/5ML PO ELIX: 12.5000 mg | ORAL_SOLUTION | ORAL | Status: DC | PRN

## 2019-08-09 MED ORDER — HYDROMORPHONE HCL 2 MG/ML IJ SOLN
INTRAMUSCULAR | Status: AC
  Filled 2019-08-09: qty 1

## 2019-08-09 MED ORDER — GLYCOPYRROLATE PF 0.2 MG/ML IJ SOSY
PREFILLED_SYRINGE | INTRAMUSCULAR | Status: AC
  Filled 2019-08-09: qty 1

## 2019-08-09 MED ORDER — APIXABAN 2.5 MG PO TABS
2.5000 mg | ORAL_TABLET | Freq: Two times a day (BID) | ORAL | Status: DC
  Administered 2019-08-10: 08:00:00 2.5 mg via ORAL
  Filled 2019-08-09: qty 1

## 2019-08-09 MED ORDER — CEFAZOLIN SODIUM-DEXTROSE 2-4 GM/100ML-% IV SOLN
2.0000 g | Freq: Four times a day (QID) | INTRAVENOUS | Status: AC
  Administered 2019-08-09 (×2): 2 g via INTRAVENOUS
  Filled 2019-08-09 (×2): qty 100

## 2019-08-09 MED ORDER — ONDANSETRON HCL 4 MG/2ML IJ SOLN
INTRAMUSCULAR | Status: DC | PRN
  Administered 2019-08-09: 4 mg via INTRAVENOUS

## 2019-08-09 MED ORDER — HYDROMORPHONE HCL 1 MG/ML IJ SOLN
INTRAMUSCULAR | Status: DC | PRN
  Administered 2019-08-09 (×2): 1 mg via INTRAVENOUS

## 2019-08-09 MED ORDER — DEXAMETHASONE SODIUM PHOSPHATE 10 MG/ML IJ SOLN: 10.0000 mg | Freq: Once | INTRAMUSCULAR | Status: AC

## 2019-08-09 MED ORDER — CEFAZOLIN SODIUM-DEXTROSE 2-4 GM/100ML-% IV SOLN
2.0000 g | INTRAVENOUS | Status: AC
  Administered 2019-08-09: 2 g via INTRAVENOUS
  Filled 2019-08-09: qty 100

## 2019-08-09 MED ORDER — FENTANYL CITRATE (PF) 100 MCG/2ML IJ SOLN
INTRAMUSCULAR | Status: DC | PRN
  Administered 2019-08-09: 25 ug via INTRAVENOUS
  Administered 2019-08-09: 50 ug via INTRAVENOUS
  Administered 2019-08-09 (×2): 25 ug via INTRAVENOUS
  Administered 2019-08-09 (×2): 50 ug via INTRAVENOUS
  Administered 2019-08-09: 25 ug via INTRAVENOUS
  Administered 2019-08-09: 50 ug via INTRAVENOUS

## 2019-08-09 MED ORDER — POVIDONE-IODINE 10 % EX SWAB
2.0000 "application " | Freq: Once | CUTANEOUS | Status: AC
  Administered 2019-08-09: 2 via TOPICAL

## 2019-08-09 MED ORDER — METHOCARBAMOL 500 MG PO TABS
500.0000 mg | ORAL_TABLET | Freq: Four times a day (QID) | ORAL | Status: DC | PRN
  Administered 2019-08-09 – 2019-08-10 (×3): 500 mg via ORAL
  Filled 2019-08-09 (×3): qty 1

## 2019-08-09 MED ORDER — ROPIVACAINE HCL 5 MG/ML IJ SOLN
INTRAMUSCULAR | Status: DC | PRN
  Administered 2019-08-09: 30 mL via PERINEURAL

## 2019-08-09 MED ORDER — HYDROCODONE-ACETAMINOPHEN 5-325 MG PO TABS: 1.0000 | ORAL_TABLET | ORAL | Status: DC | PRN

## 2019-08-09 MED ORDER — LIDOCAINE 2% (20 MG/ML) 5 ML SYRINGE
INTRAMUSCULAR | Status: DC | PRN
  Administered 2019-08-09: 40 mg via INTRAVENOUS
  Administered 2019-08-09: 60 mg via INTRAVENOUS

## 2019-08-09 MED ORDER — ONDANSETRON HCL 4 MG PO TABS: 4.0000 mg | ORAL_TABLET | Freq: Four times a day (QID) | ORAL | Status: DC | PRN

## 2019-08-09 MED ORDER — TRANEXAMIC ACID-NACL 1000-0.7 MG/100ML-% IV SOLN
1000.0000 mg | Freq: Once | INTRAVENOUS | Status: AC
  Administered 2019-08-09: 1000 mg via INTRAVENOUS
  Filled 2019-08-09: qty 100

## 2019-08-09 SURGICAL SUPPLY — 65 items
ADH SKN CLS APL DERMABOND .7 (GAUZE/BANDAGES/DRESSINGS) ×1
ATTUNE MED ANAT PAT 38 KNEE (Knees) ×1 IMPLANT
ATTUNE MED ANAT PAT 38MM KNEE (Knees) ×1 IMPLANT
ATTUNE PS FEM RT SZ 6 CEM KNEE (Femur) ×2 IMPLANT
ATTUNE PSRP INSR SZ6 7 KNEE (Insert) ×1 IMPLANT
ATTUNE PSRP INSR SZ6 7MM KNEE (Insert) ×1 IMPLANT
BAG SPEC THK2 15X12 ZIP CLS (MISCELLANEOUS)
BAG ZIPLOCK 12X15 (MISCELLANEOUS) IMPLANT
BASE TIBIA ATTUNE KNEE SYS SZ6 (Knees) IMPLANT
BLADE SAW SGTL 11.0X1.19X90.0M (BLADE) IMPLANT
BLADE SAW SGTL 13.0X1.19X90.0M (BLADE) ×3 IMPLANT
BLADE SURG SZ10 CARB STEEL (BLADE) ×6 IMPLANT
BNDG ELASTIC 6X5.8 VLCR STR LF (GAUZE/BANDAGES/DRESSINGS) ×3 IMPLANT
BOWL SMART MIX CTS (DISPOSABLE) ×3 IMPLANT
BSPLAT TIB 6 CMNT ROT PLAT STR (Knees) ×1 IMPLANT
CEMENT HV SMART SET (Cement) ×4 IMPLANT
COVER SURGICAL LIGHT HANDLE (MISCELLANEOUS) ×3 IMPLANT
COVER WAND RF STERILE (DRAPES) IMPLANT
CUFF TOURN SGL QUICK 34 (TOURNIQUET CUFF) ×3
CUFF TRNQT CYL 34X4.125X (TOURNIQUET CUFF) ×1 IMPLANT
DECANTER SPIKE VIAL GLASS SM (MISCELLANEOUS) ×6 IMPLANT
DERMABOND ADVANCED (GAUZE/BANDAGES/DRESSINGS) ×2
DERMABOND ADVANCED .7 DNX12 (GAUZE/BANDAGES/DRESSINGS) ×1 IMPLANT
DRAPE U-SHAPE 47X51 STRL (DRAPES) ×3 IMPLANT
DRESSING AQUACEL AG SP 3.5X10 (GAUZE/BANDAGES/DRESSINGS) ×1 IMPLANT
DRSG AQUACEL AG SP 3.5X10 (GAUZE/BANDAGES/DRESSINGS) ×3
DURAPREP 26ML APPLICATOR (WOUND CARE) ×6 IMPLANT
ELECT REM PT RETURN 15FT ADLT (MISCELLANEOUS) ×3 IMPLANT
GLOVE BIO SURGEON STRL SZ 6 (GLOVE) ×3 IMPLANT
GLOVE BIOGEL PI IND STRL 6.5 (GLOVE) ×1 IMPLANT
GLOVE BIOGEL PI IND STRL 7.5 (GLOVE) ×1 IMPLANT
GLOVE BIOGEL PI IND STRL 8.5 (GLOVE) ×1 IMPLANT
GLOVE BIOGEL PI INDICATOR 6.5 (GLOVE) ×2
GLOVE BIOGEL PI INDICATOR 7.5 (GLOVE) ×2
GLOVE BIOGEL PI INDICATOR 8.5 (GLOVE) ×2
GLOVE ECLIPSE 8.0 STRL XLNG CF (GLOVE) ×3 IMPLANT
GLOVE ORTHO TXT STRL SZ7.5 (GLOVE) ×3 IMPLANT
GOWN STRL REUS W/ TWL LRG LVL3 (GOWN DISPOSABLE) ×1 IMPLANT
GOWN STRL REUS W/TWL 2XL LVL3 (GOWN DISPOSABLE) ×3 IMPLANT
GOWN STRL REUS W/TWL LRG LVL3 (GOWN DISPOSABLE) ×6 IMPLANT
HANDPIECE INTERPULSE COAX TIP (DISPOSABLE) ×3
HOLDER FOLEY CATH W/STRAP (MISCELLANEOUS) IMPLANT
KIT TURNOVER KIT A (KITS) IMPLANT
MANIFOLD NEPTUNE II (INSTRUMENTS) ×3 IMPLANT
NDL SAFETY ECLIPSE 18X1.5 (NEEDLE) IMPLANT
NEEDLE HYPO 18GX1.5 SHARP (NEEDLE)
NS IRRIG 1000ML POUR BTL (IV SOLUTION) ×3 IMPLANT
PACK TOTAL KNEE CUSTOM (KITS) ×3 IMPLANT
PENCIL SMOKE EVACUATOR (MISCELLANEOUS) ×2 IMPLANT
PIN DRILL FIX HALF THREAD (BIT) ×2 IMPLANT
PIN FIX SIGMA LCS THRD HI (PIN) ×2 IMPLANT
PROTECTOR NERVE ULNAR (MISCELLANEOUS) ×3 IMPLANT
SET HNDPC FAN SPRY TIP SCT (DISPOSABLE) ×1 IMPLANT
SET PAD KNEE POSITIONER (MISCELLANEOUS) ×3 IMPLANT
SUT MNCRL AB 4-0 PS2 18 (SUTURE) ×3 IMPLANT
SUT STRATAFIX PDS+ 0 24IN (SUTURE) ×3 IMPLANT
SUT VIC AB 1 CT1 36 (SUTURE) ×3 IMPLANT
SUT VIC AB 2-0 CT1 27 (SUTURE) ×9
SUT VIC AB 2-0 CT1 TAPERPNT 27 (SUTURE) ×3 IMPLANT
SYR 3ML LL SCALE MARK (SYRINGE) ×3 IMPLANT
TIBIA ATTUNE KNEE SYS BASE SZ6 (Knees) ×3 IMPLANT
TRAY FOLEY MTR SLVR 16FR STAT (SET/KITS/TRAYS/PACK) ×1 IMPLANT
WATER STERILE IRR 1000ML POUR (IV SOLUTION) ×6 IMPLANT
WRAP KNEE MAXI GEL POST OP (GAUZE/BANDAGES/DRESSINGS) ×3 IMPLANT
YANKAUER SUCT BULB TIP 10FT TU (MISCELLANEOUS) ×3 IMPLANT

## 2019-08-09 NOTE — Plan of Care (Signed)
  Problem: Clinical Measurements: Goal: Respiratory complications will improve Outcome: Progressing   Problem: Clinical Measurements: Goal: Cardiovascular complication will be avoided Outcome: Progressing   Problem: Activity: Goal: Risk for activity intolerance will decrease Outcome: Progressing   Problem: Elimination: Goal: Will not experience complications related to bowel motility Outcome: Progressing   Problem: Safety: Goal: Ability to remain free from injury will improve Outcome: Progressing   Problem: Education: Goal: Knowledge of the prescribed therapeutic regimen will improve Outcome: Progressing   Problem: Activity: Goal: Ability to avoid complications of mobility impairment will improve Outcome: Progressing

## 2019-08-09 NOTE — Anesthesia Procedure Notes (Signed)
Anesthesia Regional Block: Adductor canal block   Pre-Anesthetic Checklist: ,, timeout performed, Correct Patient, Correct Site, Correct Laterality, Correct Procedure,, site marked, risks and benefits discussed, Surgical consent,  Pre-op evaluation,  At surgeon's request and post-op pain management  Laterality: Right  Prep: chloraprep       Needles:  Injection technique: Single-shot  Needle Type: Echogenic Stimulator Needle     Needle Length: 9cm  Needle Gauge: 21     Additional Needles:   Procedures:,,,, ultrasound used (permanent image in chart),,,,  Narrative:  Start time: 08/09/2019 6:55 AM End time: 08/09/2019 7:05 AM Injection made incrementally with aspirations every 5 mL.  Performed by: Personally  Anesthesiologist: Murvin Natal, MD  Additional Notes: Functioning IV was confirmed and monitors were applied. A time-out was performed. Hand hygiene and sterile gloves were used. The thigh was placed in a frog-leg position and prepped in a sterile fashion. A 2mm 21ga Arrow echogenic stimulator needle was placed using ultrasound guidance.  Negative aspiration and negative test dose prior to incremental administration of local anesthetic. The patient tolerated the procedure well.

## 2019-08-09 NOTE — Anesthesia Postprocedure Evaluation (Signed)
Anesthesia Post Note  Patient: Glenn [name]  Procedure(s) Performed: TOTAL KNEE ARTHROPLASTY (Right Knee)     Patient location during evaluation: PACU Anesthesia Type: Regional and General Level of consciousness: awake Pain management: pain level controlled Vital Signs Assessment: post-procedure vital signs reviewed and stable Respiratory status: spontaneous breathing, nonlabored ventilation, respiratory function stable and patient connected to nasal cannula oxygen Cardiovascular status: blood pressure returned to baseline and stable Postop Assessment: no apparent nausea or vomiting Anesthetic complications: no    Last Vitals:  Vitals:   08/09/19 1108 08/09/19 1205  BP: (!) 151/99 (!) 163/98  Pulse: 82 71  Resp: 16 16  Temp:    SpO2: 98% 99%    Last Pain:  Vitals:   08/09/19 1021  TempSrc:   PainSc: 0-No pain                 Bridger Pizzi P Gailya Tauer

## 2019-08-09 NOTE — Transfer of Care (Signed)
Immediate Anesthesia Transfer of Care Note  Patient: Glenn [name]  Procedure(s) Performed: Procedure(s) with comments: TOTAL KNEE ARTHROPLASTY (Right) - 70 mins  Patient Location: PACU  Anesthesia Type:GA combined with regional for post-op pain  Level of Consciousness:  sedated, patient cooperative and responds to stimulation  Airway & Oxygen Therapy:Patient Spontanous Breathing and Patient connected to face mask oxgen  Post-op Assessment:  Report given to PACU RN and Post -op Vital signs reviewed and stable  Post vital signs:  Reviewed and stable  Last Vitals:  Vitals:   08/09/19 0624 08/09/19 0910  BP: (!) 137/99   Pulse: 65   Resp: 19   Temp: 36.8 C (P) 36.7 C  SpO2: 0000000     Complications: No apparent anesthesia complications

## 2019-08-09 NOTE — Interval H&P Note (Signed)
History and Physical Interval Note:  08/09/2019 6:57 AM  Glenn [name]  has presented today for surgery, with the diagnosis of Right knee osteoarthritis.  The various methods of treatment have been discussed with the patient and family. After consideration of risks, benefits and other options for treatment, the patient has consented to  Procedure(s) with comments: TOTAL KNEE ARTHROPLASTY (Right) - 70 mins as a surgical intervention.  The patient's history has been reviewed, patient examined, no change in status, stable for surgery.  I have reviewed the patient's chart and labs.  Questions were answered to the patient's satisfaction.     Mauri Pole

## 2019-08-09 NOTE — Anesthesia Procedure Notes (Signed)
Procedure Name: LMA Insertion Date/Time: 08/09/2019 7:44 AM Performed by: Lavina Hamman, CRNA Pre-anesthesia Checklist: Patient identified, Emergency Drugs available, Suction available and Patient being monitored Patient Re-evaluated:Patient Re-evaluated prior to induction Oxygen Delivery Method: Circle System Utilized Preoxygenation: Pre-oxygenation with 100% oxygen Induction Type: IV induction Ventilation: Mask ventilation without difficulty LMA: LMA inserted LMA Size: 5.0 Number of attempts: 1 Airway Equipment and Method: Bite block Placement Confirmation: positive ETCO2 Tube secured with: Tape Dental Injury: Teeth and Oropharynx as per pre-operative assessment

## 2019-08-09 NOTE — Op Note (Signed)
NAME:  Glenn [name]                      MEDICAL RECORD NO.:  [id number]                             FACILITY:  [facility]      PHYSICIAN:  Pietro Cassis. Alvan Dame, M.D.  DATE OF BIRTH:  29-Jun-1940      DATE OF PROCEDURE:  08/09/2019                                     OPERATIVE REPORT         PREOPERATIVE DIAGNOSIS:  Right knee osteoarthritis.      POSTOPERATIVE DIAGNOSIS:  Right knee osteoarthritis.      FINDINGS:  The patient was noted to have complete loss of cartilage and   bone-on-bone arthritis with associated osteophytes in the medial and patellofemoral compartments of   the knee with a significant synovitis and associated effusion.  The patient had failed months of conservative treatment including medications, injection therapy, activity modification.     PROCEDURE:  Right total knee replacement.      COMPONENTS USED:  DePuy Attune rotating platform posterior stabilized knee   system, a size 6 femur, 6 tibia, size 7 mm PS AOX insert, and 38 anatomic patellar   button.      SURGEON:  Pietro Cassis. Alvan Dame, M.D.      ASSISTANT:  Griffith Citron, PA-C.      ANESTHESIA:  General and Regional.      SPECIMENS:  None.      COMPLICATION:  None.      DRAINS:  None.  EBL: <100cc      TOURNIQUET TIME:   Total Tourniquet Time Documented: Thigh (Right) - 32 minutes Total: Thigh (Right) - 32 minutes  .      The patient was stable to the recovery room.      INDICATION FOR PROCEDURE:  Glenn [name] is a 79 y.o. male patient of   mine.  The patient had been seen, evaluated, and treated for months conservatively in the   office with medication, activity modification, and injections.  The patient had   radiographic changes of bone-on-bone arthritis with endplate sclerosis and osteophytes noted.  Based on the radiographic changes and failed conservative measures, the patient   decided to proceed with definitive treatment, total knee replacement.  Risks of infection, DVT, component failure, need for  revision surgery, neurovascular injury were reviewed in the office setting.  The postop course was reviewed stressing the efforts to maximize post-operative satisfaction and function.  Consent was obtained for benefit of pain   relief.      PROCEDURE IN DETAIL:  The patient was brought to the operative theater.   Once adequate anesthesia, preoperative antibiotics, 2 gm of Ancef,1 gm of Tranexamic Acid, and 10 mg of Decadron administered, the patient was positioned supine with a right thigh tourniquet placed.  The  right lower extremity was prepped and draped in sterile fashion.  A time-   out was performed identifying the patient, planned procedure, and the appropriate extremity.      The right lower extremity was placed in the [facility] leg holder.  The leg was   exsanguinated, tourniquet elevated to 250 mmHg.  A midline incision was   made  followed by median parapatellar arthrotomy.  Following initial   exposure, attention was first directed to the patella.  Precut   measurement was noted to be 27 mm.  I resected down to 15 mm and used a   38 anatomic patellar button to restore patellar height as well as cover the cut surface.      The lug holes were drilled and a metal shim was placed to protect the   patella from retractors and saw blade during the procedure.      At this point, attention was now directed to the femur.  The femoral   canal was opened with a drill, irrigated to try to prevent fat emboli.  An   intramedullary rod was passed at 5 degrees valgus, 10 mm of bone was   resected off the distal femur due to pre-operative flexion contracture.  Following this resection, the tibia was   subluxated anteriorly.  Using the extramedullary guide, 2 mm of bone was resected off   the proximal medail tibia.  We confirmed the gap would be   stable medially and laterally with a size 5 spacer block as well as confirmed that the tibial cut was perpendicular in the coronal plane, checking with an  alignment rod.      Once this was done, I sized the femur to be a size 6 in the anterior-   posterior dimension, chose a standard component based on medial and   lateral dimension.  The size 6 rotation block was then pinned in   position anterior referenced using the C-clamp to set rotation.  The   anterior, posterior, and  chamfer cuts were made without difficulty nor   notching making certain that I was along the anterior cortex to help   with flexion gap stability.      The final box cut was made off the lateral aspect of distal femur.  Following this preparation I used the laminar spreaders to expose the posterior aspect of the knee to removed posterior femoral osteophytes.      At this point, the tibia was sized to be a size 6.  The size 6 tray was   then pinned in position through the medial third of the tubercle,   drilled, and keel punched.  Trial reduction was now carried with a 6 femur,  6 tibia, a size 7 mm PS insert, and the 38 anatomic patella botton.  The knee was brought to full extension with good flexion stability with the patella   tracking through the trochlea without application of pressure.  Given   all these findings the trial components removed.  Final components were   opened and cement was mixed.  The knee was irrigated with normal saline solution and pulse lavage.  The synovial lining was   then injected with 30 cc of 0.25% Marcaine with epinephrine, 1 cc of Toradol and 30 cc of NS for a total of 61 cc.     Final implants were then cemented onto cleaned and dried cut surfaces of bone with the knee brought to extension with a size 7 mm PS trial insert.      Once the cement had fully cured, excess cement was removed   throughout the knee.  I confirmed that I was satisfied with the range of   motion and stability, and the final size 7 mm PS AOX insert was chosen.  It was   placed into the knee.      The  tourniquet had been let down at 32 minutes.  No significant    hemostasis was required.  The extensor mechanism was then reapproximated using #1 Vicryl and #1 Stratafix sutures with the knee   in flexion.  The   remaining wound was closed with 2-0 Vicryl and running 4-0 Monocryl.   The knee was cleaned, dried, dressed sterilely using Dermabond and   Aquacel dressing.  The patient was then   brought to recovery room in stable condition, tolerating the procedure   well.   Please note that Physician Assistant, Griffith Citron, PA-C was present for the entirety of the case, and was utilized for pre-operative positioning, peri-operative retractor management, general facilitation of the procedure and for primary wound closure at the end of the case.              Pietro Cassis Alvan Dame, M.D.    08/09/2019 8:50 AM

## 2019-08-09 NOTE — Evaluation (Signed)
Physical Therapy Evaluation Patient Details Name: Glenn [name] MRN: [id number] DOB: Apr 17, 1941 Today's Date: 08/09/2019   History of Present Illness  R TKA; PMH of L TKA ~10 years ago, L brachial plexus injury (fully recovered) aquired during prostate surgery ~25 years ago  Clinical Impression  Pt is s/p TKA resulting in the deficits listed below (see PT Problem List). Pt ambulated 100' with RW, no loss of balance. INitiated TKA HEP. Good progress expected, pt is motivated and puts forth good effort.  Pt will benefit from skilled PT to increase their independence and safety with mobility to allow discharge to the venue listed below.      Follow Up Recommendations Outpatient PT;Follow surgeon's recommendation for DC plan and follow-up therapies(OPPT scheduled for Friday 08/12/19)    Equipment Recommendations  3in1 (PT)    Recommendations for Other Services       Precautions / Restrictions Precautions Precautions: Knee Precaution Comments: reviewed no pillow under knee with pt/spouse Restrictions Weight Bearing Restrictions: No Other Position/Activity Restrictions: WBAT      Mobility  Bed Mobility Overal bed mobility: Modified Independent             General bed mobility comments: HOB up  Transfers Overall transfer level: Needs assistance Equipment used: Rolling walker (2 wheeled) Transfers: Sit to/from Stand Sit to Stand: Min guard;From elevated surface         General transfer comment: VCs hand placement  Ambulation/Gait Ambulation/Gait assistance: Min guard Gait Distance (Feet): 100 Feet Assistive device: Rolling walker (2 wheeled) Gait Pattern/deviations: Step-to pattern;Decreased stride length;Decreased weight shift to right Gait velocity: decr   General Gait Details: steady, no loss of balance, VCs hand placement  Stairs            Wheelchair Mobility    Modified Rankin (Stroke Patients Only)       Balance Overall balance assessment:  Modified Independent                                           Pertinent Vitals/Pain Pain Assessment: 0-10 Pain Score: 2  Pain Location: R Thigh Pain Descriptors / Indicators: Aching Pain Intervention(s): Limited activity within patient's tolerance;Monitored during session;Ice applied    Home Living Family/patient expects to be discharged to:: Private residence Living Arrangements: Spouse/significant other Available Help at Discharge: Family;Available 24 hours/day   Home Access: Level entry     Home Layout: Two level;Able to live on main level with bedroom/bathroom Home Equipment: Gilford Rile - 2 wheels      Prior Function Level of Independence: Independent               Hand Dominance        Extremity/Trunk Assessment   Upper Extremity Assessment Upper Extremity Assessment: Overall WFL for tasks assessed    Lower Extremity Assessment Lower Extremity Assessment: RLE deficits/detail RLE Deficits / Details: SLR at least 3/5, knee ext at least 3/5, AAROM knee 5-50* RLE Sensation: WNL RLE Coordination: WNL    Cervical / Trunk Assessment Cervical / Trunk Assessment: Normal  Communication   Communication: HOH  Cognition Arousal/Alertness: Awake/alert Behavior During Therapy: WFL for tasks assessed/performed Overall Cognitive Status: Within Functional Limits for tasks assessed  General Comments      Exercises Total Joint Exercises Ankle Circles/Pumps: AROM;Both;10 reps;Supine Quad Sets: AROM;Both;5 reps;Supine Heel Slides: AAROM;Right;10 reps;Supine Long Arc Quad: AROM;Right;5 reps;Seated Goniometric ROM: 5-50* AAROM R knee   Assessment/Plan    PT Assessment Patient needs continued PT services  PT Problem List Decreased strength;Decreased range of motion;Decreased activity tolerance;Pain;Decreased knowledge of use of DME;Decreased mobility       PT Treatment Interventions DME  instruction;Therapeutic activities;Gait training;Functional mobility training;Therapeutic exercise;Patient/family education    PT Goals (Current goals can be found in the Care Plan section)  Acute Rehab PT Goals Patient Stated Goal: golf, tennis PT Goal Formulation: With patient/family Time For Goal Achievement: 08/16/19 Potential to Achieve Goals: Good    Frequency 7X/week   Barriers to discharge        Co-evaluation               AM-PAC PT "6 Clicks" Mobility  Outcome Measure Help needed turning from your back to your side while in a flat bed without using bedrails?: A Little Help needed moving from lying on your back to sitting on the side of a flat bed without using bedrails?: A Little Help needed moving to and from a bed to a chair (including a wheelchair)?: A Little Help needed standing up from a chair using your arms (e.g., wheelchair or bedside chair)?: A Little Help needed to walk in hospital room?: A Little Help needed climbing 3-5 steps with a railing? : A Lot 6 Click Score: 17    End of Session Equipment Utilized During Treatment: Gait belt Activity Tolerance: Patient tolerated treatment well Patient left: in chair;with call bell/phone within reach;with chair alarm set;with family/visitor present Nurse Communication: Mobility status PT Visit Diagnosis: Difficulty in walking, not elsewhere classified (R26.2);Pain;Muscle weakness (generalized) (M62.81) Pain - Right/Left: Right Pain - part of body: Knee    Time: [id number] PT Time Calculation (min) (ACUTE ONLY): 39 min   Charges:   PT Evaluation $PT Eval Low Complexity: 1 Low PT Treatments $Gait Training: 8-22 mins $Therapeutic Exercise: 8-22 mins      Blondell Reveal Kistler PT 08/09/2019  Acute Rehabilitation Services Pager [phone] Office [phone]

## 2019-08-09 NOTE — Discharge Instructions (Signed)

## 2019-08-10 ENCOUNTER — Encounter: Payer: Self-pay | Admitting: *Deleted

## 2019-08-10 DIAGNOSIS — M1711 Unilateral primary osteoarthritis, right knee: Secondary | ICD-10-CM | POA: Diagnosis not present

## 2019-08-10 LAB — CBC
HCT: 39.4 % (ref 39.0–52.0)
Hemoglobin: 13 g/dL (ref 13.0–17.0)
MCH: 31.5 pg (ref 26.0–34.0)
MCHC: 33 g/dL (ref 30.0–36.0)
MCV: 95.4 fL (ref 80.0–100.0)
Platelets: 205 10*3/uL (ref 150–400)
RBC: 4.13 MIL/uL — ABNORMAL LOW (ref 4.22–5.81)
RDW: 13.8 % (ref 11.5–15.5)
WBC: 20.1 10*3/uL — ABNORMAL HIGH (ref 4.0–10.5)
nRBC: 0 % (ref 0.0–0.2)

## 2019-08-10 LAB — BASIC METABOLIC PANEL
Anion gap: 7 (ref 5–15)
BUN: 26 mg/dL — ABNORMAL HIGH (ref 8–23)
CO2: 24 mmol/L (ref 22–32)
Calcium: 8.1 mg/dL — ABNORMAL LOW (ref 8.9–10.3)
Chloride: 104 mmol/L (ref 98–111)
Creatinine, Ser: 1.18 mg/dL (ref 0.61–1.24)
GFR calc Af Amer: >60 mL/min (ref >60)
GFR calc non Af Amer: 59 mL/min — ABNORMAL LOW (ref >60)
Glucose, Bld: 182 mg/dL — ABNORMAL HIGH (ref 70–99)
Potassium: 3.9 mmol/L (ref 3.5–5.1)
Sodium: 135 mmol/L (ref 135–145)

## 2019-08-10 MED ORDER — POLYETHYLENE GLYCOL 3350 17 G PO PACK: 17.0000 g | PACK | Freq: Two times a day (BID) | ORAL | 0 refills | Status: DC

## 2019-08-10 MED ORDER — DOCUSATE SODIUM 100 MG PO CAPS: 100.0000 mg | ORAL_CAPSULE | Freq: Two times a day (BID) | ORAL | 0 refills | Status: DC

## 2019-08-10 MED ORDER — FERROUS SULFATE 325 (65 FE) MG PO TABS: 325.0000 mg | ORAL_TABLET | Freq: Three times a day (TID) | ORAL | 0 refills | Status: DC

## 2019-08-10 MED ORDER — HYDROCODONE-ACETAMINOPHEN 5-325 MG PO TABS: 1.0000 | ORAL_TABLET | ORAL | 0 refills | Status: DC | PRN

## 2019-08-10 MED ORDER — METHOCARBAMOL 500 MG PO TABS: 500.0000 mg | ORAL_TABLET | Freq: Four times a day (QID) | ORAL | 0 refills | Status: DC | PRN

## 2019-08-10 NOTE — Progress Notes (Addendum)
Physical Therapy Treatment Patient Details Name: Glenn [name] MRN: [id number] DOB: 10/26/1940 Today's Date: 08/10/2019    History of Present Illness R TKA; PMH of L TKA ~10 years ago, L brachial plexus injury (fully recovered) aquired during prostate surgery ~25 years ago    PT Comments    Pt has met PT goals and is ready to DC home from PT standpoint. HE ambulated 180' with RW, completed stair training, and demonstrates good understanding of HEP.   Follow Up Recommendations  Outpatient PT;Follow surgeon's recommendation for DC plan and follow-up therapies(OPPT scheduled for Friday 08/12/19)     Equipment Recommendations  3in1 (PT)    Recommendations for Other Services       Precautions / Restrictions Precautions Precautions: Knee Precaution Comments: reviewed no pillow under knee with pt/spouse Restrictions Weight Bearing Restrictions: No Other Position/Activity Restrictions: WBAT    Mobility  Bed Mobility Overal bed mobility: Modified Independent             General bed mobility comments: HOB up  Transfers Overall transfer level: Needs assistance Equipment used: Rolling walker (2 wheeled) Transfers: Sit to/from Stand Sit to Stand: From elevated surface;Supervision         General transfer comment: VCs hand placement  Ambulation/Gait Ambulation/Gait assistance: Supervision Gait Distance (Feet): 180 Feet Assistive device: Rolling walker (2 wheeled) Gait Pattern/deviations: Step-to pattern;Decreased stride length;Decreased weight shift to right Gait velocity: decr   General Gait Details: steady, no loss of balance   Stairs Stairs: Yes Stairs assistance: Min guard Stair Management: One rail Left;Forwards;With cane; step to pattern Number of Stairs: 3 General stair comments: VCs sequencing   Wheelchair Mobility    Modified Rankin (Stroke Patients Only)       Balance Overall balance assessment: Modified Independent                                           Cognition Arousal/Alertness: Awake/alert Behavior During Therapy: WFL for tasks assessed/performed Overall Cognitive Status: Within Functional Limits for tasks assessed                                        Exercises Total Joint Exercises Ankle Circles/Pumps: AROM;Both;10 reps;Supine Quad Sets: AROM;Both;5 reps;Supine Short Arc Quad: AROM;Right;10 reps;Supine Heel Slides: AAROM;Right;10 reps;Supine Hip ABduction/ADduction: AROM;Right;10 reps;Supine Straight Leg Raises: AROM;Right;10 reps Long Arc Quad: AROM;Right;Seated;10 reps Knee Flexion: AAROM;Right;10 reps;Seated Goniometric ROM: 5-90* AAROM R knee    General Comments        Pertinent Vitals/Pain Pain Score: 2  Pain Location: R thigh  Monitored during session, applied ice    Home Living                      Prior Function            PT Goals (current goals can now be found in the care plan section) Acute Rehab PT Goals Patient Stated Goal: golf, tennis PT Goal Formulation: With patient/family Time For Goal Achievement: 08/16/19 Potential to Achieve Goals: Good Progress towards PT goals: Goals met/education completed, patient discharged from PT    Frequency    7X/week      PT Plan Current plan remains appropriate    Co-evaluation  AM-PAC PT "6 Clicks" Mobility   Outcome Measure  Help needed turning from your back to your side while in a flat bed without using bedrails?: None Help needed moving from lying on your back to sitting on the side of a flat bed without using bedrails?: None Help needed moving to and from a bed to a chair (including a wheelchair)?: None Help needed standing up from a chair using your arms (e.g., wheelchair or bedside chair)?: None Help needed to walk in hospital room?: None Help needed climbing 3-5 steps with a railing? : A Little 6 Click Score: 23    End of Session Equipment Utilized During  Treatment: Gait belt Activity Tolerance: Patient tolerated treatment well Patient left: in chair;with call bell/phone within reach;with chair alarm set;with family/visitor present Nurse Communication: Mobility status PT Visit Diagnosis: Difficulty in walking, not elsewhere classified (R26.2);Pain;Muscle weakness (generalized) (M62.81) Pain - Right/Left: Right Pain - part of body: Knee     Time: 0165-5374 PT Time Calculation (min) (ACUTE ONLY): 29 min  Charges:  $Gait Training: 8-22 mins $Therapeutic Exercise: 8-22 mins                    Blondell Reveal Kistler PT 08/10/2019  Acute Rehabilitation Services Pager [phone] Office [phone]

## 2019-08-10 NOTE — Progress Notes (Signed)
Patient discharged to home w/ family. Given all belongings, instructions, equipment. Verbalized understanding of all instructions. Escorted to pov via w/c. 

## 2019-08-10 NOTE — TOC Progression Note (Signed)
Transition of Care [facility]) - Progression Note    Patient Details  Name: Glenn [name] MRN: [id number] Date of Birth: Sep 08, 1940  Transition of Care [facility]) CM/SW [place], LCSW Phone Number: 08/10/2019, 9:47 AM  Clinical Narrative:    Therapy Plan: OPPT Patient has RW and 3 in 1     Barriers to Discharge: Barriers Resolved  Expected Discharge Plan and Services           Expected Discharge Date: 08/10/19               DME Arranged: N/A(Has RW and 3 in1) DME Agency: NA       HH Arranged: NA HH Agency: NA         Social Determinants of Health (SDOH) Interventions    Readmission Risk Interventions No flowsheet data found.

## 2019-08-10 NOTE — Progress Notes (Signed)
     Subjective: 1 Day Post-Op Procedure(s) (LRB): TOTAL KNEE ARTHROPLASTY (Right)   Patient reports pain as mild, pain controlled.  No reported events throughout the night.  Feels that he is doing well with very little pain.  Discussed the procedure and expectations moving forward.   Ready to be discharged home.  Follow up in the clinic in 2 weeks.Knows to call with any questions or concerns.    Patient's anticipated LOS is less than 2 midnights, meeting these requirements: - Lives within 1 hour of care - Has a competent adult at home to recover with post-op recover - NO history of  - Chronic pain requiring opiods  - Diabetes  - Coronary Artery Disease  - Heart failure  - Heart attack  - Stroke  - DVT/VTE  - Cardiac arrhythmia  - Respiratory Failure/COPD  - Renal failure  - Anemia  - Advanced Liver disease       Objective:   VITALS:   Vitals:   08/10/19 0132 08/10/19 0526  BP: 139/90 118/73  Pulse: 60 (!) 51  Resp: 18 19  Temp: 98.3 F (36.8 C) 98.6 F (37 C)  SpO2: 96% 98%    Dorsiflexion/Plantar flexion intact Incision: dressing C/D/I No cellulitis present Compartment soft  LABS Recent Labs    08/10/19 0323  HGB 13.0  HCT 39.4  WBC 20.1*  PLT 205    Recent Labs    08/10/19 0323  NA 135  K 3.9  BUN 26*  CREATININE 1.18  GLUCOSE 182*     Assessment/Plan: 1 Day Post-Op Procedure(s) (LRB): TOTAL KNEE ARTHROPLASTY (Right) Foley cath d/c'ed Advance diet Up with therapy D/C IV fluids Discharge home Follow up in 2 weeks at [facility] Follow up with OLIN,Missie Gehrig D in 2 weeks.  Contact information:  [facility] [address] [place] [id number]    Overweight (BMI 25-29.9) Estimated body mass index is 28.03 kg/m as calculated from the following:   Height as of this encounter: 5\' 11"  (1.803 m).   Weight as of this encounter: 91.2 kg. Patient also counseled that weight may inhibit the healing  process Patient counseled that losing weight will help with future health issues      Danae Orleans PA-C  [facility]  [place] [address], [address], [place], [place] [address] Phone: [phone] [email or web address] Facebook  [employer]

## 2019-08-12 DIAGNOSIS — M25661 Stiffness of right knee, not elsewhere classified: Secondary | ICD-10-CM | POA: Diagnosis not present

## 2019-08-12 DIAGNOSIS — M25561 Pain in right knee: Secondary | ICD-10-CM | POA: Diagnosis not present

## 2019-08-16 DIAGNOSIS — M25661 Stiffness of right knee, not elsewhere classified: Secondary | ICD-10-CM | POA: Diagnosis not present

## 2019-08-16 DIAGNOSIS — M25561 Pain in right knee: Secondary | ICD-10-CM | POA: Diagnosis not present

## 2019-08-17 DIAGNOSIS — E663 Overweight: Secondary | ICD-10-CM | POA: Diagnosis present

## 2019-08-17 NOTE — Discharge Summary (Signed)
Physician Discharge Summary  Patient ID: Glenn [name] MRN: [id number] DOB/AGE: Feb 17, 1941 79 y.o.  Admit date: 08/09/2019 Discharge date: 08/10/2019   Procedures:  Procedure(s) (LRB): TOTAL KNEE ARTHROPLASTY (Right)  Attending Physician:  Dr. Paralee Cancel   Admission Diagnoses:   Right knee primary OA / pain  Discharge Diagnoses:  Principal Problem:   S/P right TKA Active Problems:   Overweight (BMI 25.0-29.9)  Past Medical History:  Diagnosis Date   Atrial fibrillation ([place])    takes aspirin for this   Brachial plexus injury 1995   Left arm   Dysrhythmia 2009   A-fib   HTN (hypertension)    6 years   Injury of brachial plexus    Prostate cancer ([facility])    Prostate cancer ([place])    Torn rotator cuff    Torn rotator cuff 2000   Right arm    HPI:    Glenn [name], 79 y.o. male, has a history of pain and functional disability in the right knee due to arthritis and has failed non-surgical conservative treatments for greater than 12 weeks to include NSAID's and/or analgesics, corticosteriod injections and activity modification.  Onset of symptoms was gradual, starting 2+ years ago with gradually worsening course since that time. The patient noted no past surgery on the right knee(s).  Patient currently rates pain in the right knee(s) at 8 out of 10 with activity. Patient has worsening of pain with activity and weight bearing, pain that interferes with activities of daily living, pain with passive range of motion, crepitus and joint swelling.  Patient has evidence of periarticular osteophytes and joint space narrowing by imaging studies.  There is no active infection.  Risks, benefits and expectations were discussed with the patient.  Risks including but not limited to the risk of anesthesia, blood clots, nerve damage, blood vessel damage, failure of the prosthesis, infection and up to and including death.  Patient understand the risks, benefits and expectations and wishes to  proceed with surgery.   PCP: Alroy Dust, L.Marlou Sa, MD   Discharged Condition: good  Hospital Course:  Patient underwent the above stated procedure on 08/09/2019. Patient tolerated the procedure well and brought to the recovery room in good condition and subsequently to the floor.  POD #1 BP: 118/73 ; Pulse: 51 ; Temp: 98.6 F (37 C) ; Resp: 19 Patient reports pain as mild, pain controlled.  No reported events throughout the night.  Feels that he is doing well with very little pain.  Discussed the procedure and expectations moving forward.   Ready to be discharged home.  Dorsiflexion/plantar flexion intact, incision: dressing C/D/I, no cellulitis present and compartment soft.   LABS  Basename    HGB     13.0  HCT     39.4    Discharge Exam: General appearance: alert, cooperative and no distress Extremities: Homans sign is negative, no sign of DVT, no edema, redness or tenderness in the calves or thighs and no ulcers, gangrene or trophic changes  Disposition: Home with follow up in 2 weeks   Follow-up Information    Paralee Cancel, MD. Schedule an appointment as soon as possible for a visit in 2 weeks.   Specialty: Orthopedic Surgery Contact information: [address] [place] [address] [id number]           Discharge Instructions    Call MD / Call 911   Complete by: As directed    If you experience chest pain or shortness of  breath, CALL 911 and be transported to the hospital emergency room.  If you develope a fever above 101 F, pus (white drainage) or increased drainage or redness at the wound, or calf pain, call your surgeon's office.   Change dressing   Complete by: As directed    Maintain surgical dressing until follow up in the clinic. If the edges start to pull up, may reinforce with tape. If the dressing is no longer working, may remove and cover with gauze and tape, but must keep the area dry and clean.  Call with any questions or concerns.    Constipation Prevention   Complete by: As directed    Drink plenty of fluids.  Prune juice may be helpful.  You may use a stool softener, such as Colace (over the counter) 100 mg twice a day.  Use MiraLax (over the counter) for constipation as needed.   Diet - low sodium heart healthy   Complete by: As directed    Discharge instructions   Complete by: As directed    Maintain surgical dressing until follow up in the clinic. If the edges start to pull up, may reinforce with tape. If the dressing is no longer working, may remove and cover with gauze and tape, but must keep the area dry and clean.  Follow up in 2 weeks at [facility]. Call with any questions or concerns.   Increase activity slowly as tolerated   Complete by: As directed    Weight bearing as tolerated with assist device (walker, cane, etc) as directed, use it as long as suggested by your surgeon or therapist, typically at least 4-6 weeks.   TED hose   Complete by: As directed    Use stockings (TED hose) for 2 weeks on both leg(s).  You may remove them at night for sleeping.      Allergies as of 08/10/2019   No Known Allergies     Medication List    STOP taking these medications   ibuprofen 200 MG tablet Commonly known as: ADVIL     TAKE these medications   amoxicillin 500 MG capsule Commonly known as: AMOXIL Take 2,000 mg by mouth See admin instructions. Take 4 capsules (2000 mg) by mouth 1 hour prior to dental procedures.   atorvastatin 10 MG tablet Commonly known as: LIPITOR TAKE ONE TABLET BY MOUTH DAILY   docusate sodium 100 MG capsule Commonly known as: Colace Take 1 capsule (100 mg total) by mouth 2 (two) times daily.   doxycycline 100 MG capsule Commonly known as: VIBRAMYCIN Take 1 capsule (100 mg total) by mouth 2 (two) times daily.   Eliquis 5 MG Tabs tablet Generic drug: apixaban TAKE ONE TABLET BY MOUTH TWICE A DAY What changed: how much to take   ferrous sulfate 325 (65 FE) MG tablet Commonly  known as: FerrouSul Take 1 tablet (325 mg total) by mouth 3 (three) times daily with meals for 14 days.   hydrochlorothiazide 25 MG tablet Commonly known as: HYDRODIURIL TAKE ONE TABLET BY MOUTH DAILY *PLEASE KEEP UPCOMING APPOINTMENT IN NOVEMBER WITH DOCTOR TAYLOR BEFORE FURTHER REFILLS* What changed: See the new instructions.   HYDROcodone-acetaminophen 5-325 MG tablet Commonly known as: Norco Take 1-2 tablets by mouth every 4 (four) hours as needed for moderate pain or severe pain.   lisinopril 20 MG tablet Commonly known as: ZESTRIL Take 1 tablet (20 mg total) by mouth daily.   methocarbamol 500 MG tablet Commonly known as: Robaxin Take 1 tablet (500 mg  total) by mouth every 6 (six) hours as needed for muscle spasms.   polyethylene glycol 17 g packet Commonly known as: MIRALAX / GLYCOLAX Take 17 g by mouth 2 (two) times daily.   tetrahydrozoline 0.05 % ophthalmic solution Place 1-2 drops into both eyes 3 (three) times daily as needed (dry/irritated eyes.).            Discharge Care Instructions  (From admission, onward)         Start     Ordered   08/10/19 0000  Change dressing    Comments: Maintain surgical dressing until follow up in the clinic. If the edges start to pull up, may reinforce with tape. If the dressing is no longer working, may remove and cover with gauze and tape, but must keep the area dry and clean.  Call with any questions or concerns.   08/10/19 0902           Signed: [place] Pugh. Alexianna Nachreiner   PA-C  08/17/2019, 9:41 PM

## 2019-08-18 DIAGNOSIS — M25661 Stiffness of right knee, not elsewhere classified: Secondary | ICD-10-CM | POA: Diagnosis not present

## 2019-08-18 DIAGNOSIS — M25561 Pain in right knee: Secondary | ICD-10-CM | POA: Diagnosis not present

## 2019-08-22 DIAGNOSIS — M25561 Pain in right knee: Secondary | ICD-10-CM | POA: Diagnosis not present

## 2019-08-22 DIAGNOSIS — M25661 Stiffness of right knee, not elsewhere classified: Secondary | ICD-10-CM | POA: Diagnosis not present

## 2019-08-24 DIAGNOSIS — M25661 Stiffness of right knee, not elsewhere classified: Secondary | ICD-10-CM | POA: Diagnosis not present

## 2019-08-24 DIAGNOSIS — M25561 Pain in right knee: Secondary | ICD-10-CM | POA: Diagnosis not present

## 2019-08-25 ENCOUNTER — Other Ambulatory Visit: Payer: Self-pay | Admitting: Internal Medicine

## 2019-08-25 NOTE — Telephone Encounter (Signed)
Eliquis 5mg  refill request received. Patient is 79 years old, weight-91.2kg, Crea-1.18 on 08/10/19, Diagnosis-Afib, and last seen by Tommye Standard on 07/26/2019. Dose is appropriate based on dosing criteria. Will send in refill to requested pharmacy.

## 2019-08-29 DIAGNOSIS — M25661 Stiffness of right knee, not elsewhere classified: Secondary | ICD-10-CM | POA: Diagnosis not present

## 2019-08-29 DIAGNOSIS — M25561 Pain in right knee: Secondary | ICD-10-CM | POA: Diagnosis not present

## 2019-08-31 DIAGNOSIS — M25661 Stiffness of right knee, not elsewhere classified: Secondary | ICD-10-CM | POA: Diagnosis not present

## 2019-08-31 DIAGNOSIS — M25561 Pain in right knee: Secondary | ICD-10-CM | POA: Diagnosis not present

## 2019-09-02 ENCOUNTER — Other Ambulatory Visit: Payer: Self-pay | Admitting: Internal Medicine

## 2019-09-05 DIAGNOSIS — M25661 Stiffness of right knee, not elsewhere classified: Secondary | ICD-10-CM | POA: Diagnosis not present

## 2019-09-05 DIAGNOSIS — M25561 Pain in right knee: Secondary | ICD-10-CM | POA: Diagnosis not present

## 2019-09-12 DIAGNOSIS — M25661 Stiffness of right knee, not elsewhere classified: Secondary | ICD-10-CM | POA: Diagnosis not present

## 2019-09-12 DIAGNOSIS — M25561 Pain in right knee: Secondary | ICD-10-CM | POA: Diagnosis not present

## 2019-09-20 DIAGNOSIS — M25661 Stiffness of right knee, not elsewhere classified: Secondary | ICD-10-CM | POA: Diagnosis not present

## 2019-09-20 DIAGNOSIS — M25561 Pain in right knee: Secondary | ICD-10-CM | POA: Diagnosis not present

## 2019-09-26 DIAGNOSIS — M25561 Pain in right knee: Secondary | ICD-10-CM | POA: Diagnosis not present

## 2019-09-26 DIAGNOSIS — M25661 Stiffness of right knee, not elsewhere classified: Secondary | ICD-10-CM | POA: Diagnosis not present

## 2019-09-28 DIAGNOSIS — Z96651 Presence of right artificial knee joint: Secondary | ICD-10-CM | POA: Diagnosis not present

## 2019-09-28 DIAGNOSIS — Z471 Aftercare following joint replacement surgery: Secondary | ICD-10-CM | POA: Diagnosis not present

## 2019-09-29 DIAGNOSIS — M25561 Pain in right knee: Secondary | ICD-10-CM | POA: Diagnosis not present

## 2019-09-29 DIAGNOSIS — M25661 Stiffness of right knee, not elsewhere classified: Secondary | ICD-10-CM | POA: Diagnosis not present

## 2019-10-04 DIAGNOSIS — M25661 Stiffness of right knee, not elsewhere classified: Secondary | ICD-10-CM | POA: Diagnosis not present

## 2019-10-04 DIAGNOSIS — M25561 Pain in right knee: Secondary | ICD-10-CM | POA: Diagnosis not present

## 2019-10-07 DIAGNOSIS — M25561 Pain in right knee: Secondary | ICD-10-CM | POA: Diagnosis not present

## 2019-10-07 DIAGNOSIS — M25661 Stiffness of right knee, not elsewhere classified: Secondary | ICD-10-CM | POA: Diagnosis not present

## 2019-10-11 DIAGNOSIS — M25561 Pain in right knee: Secondary | ICD-10-CM | POA: Diagnosis not present

## 2019-10-11 DIAGNOSIS — M25661 Stiffness of right knee, not elsewhere classified: Secondary | ICD-10-CM | POA: Diagnosis not present

## 2019-10-13 DIAGNOSIS — M25561 Pain in right knee: Secondary | ICD-10-CM | POA: Diagnosis not present

## 2019-10-13 DIAGNOSIS — M25661 Stiffness of right knee, not elsewhere classified: Secondary | ICD-10-CM | POA: Diagnosis not present

## 2019-10-18 DIAGNOSIS — M25561 Pain in right knee: Secondary | ICD-10-CM | POA: Diagnosis not present

## 2019-10-18 DIAGNOSIS — M25661 Stiffness of right knee, not elsewhere classified: Secondary | ICD-10-CM | POA: Diagnosis not present

## 2019-10-20 DIAGNOSIS — M25661 Stiffness of right knee, not elsewhere classified: Secondary | ICD-10-CM | POA: Diagnosis not present

## 2019-10-20 DIAGNOSIS — M25561 Pain in right knee: Secondary | ICD-10-CM | POA: Diagnosis not present

## 2019-10-25 DIAGNOSIS — M25561 Pain in right knee: Secondary | ICD-10-CM | POA: Diagnosis not present

## 2019-10-25 DIAGNOSIS — M25661 Stiffness of right knee, not elsewhere classified: Secondary | ICD-10-CM | POA: Diagnosis not present

## 2019-10-27 DIAGNOSIS — M25561 Pain in right knee: Secondary | ICD-10-CM | POA: Diagnosis not present

## 2019-10-27 DIAGNOSIS — I4891 Unspecified atrial fibrillation: Secondary | ICD-10-CM | POA: Diagnosis not present

## 2019-10-27 DIAGNOSIS — E7800 Pure hypercholesterolemia, unspecified: Secondary | ICD-10-CM | POA: Diagnosis not present

## 2019-10-27 DIAGNOSIS — I10 Essential (primary) hypertension: Secondary | ICD-10-CM | POA: Diagnosis not present

## 2019-10-27 DIAGNOSIS — M170 Bilateral primary osteoarthritis of knee: Secondary | ICD-10-CM | POA: Diagnosis not present

## 2019-10-27 DIAGNOSIS — M25661 Stiffness of right knee, not elsewhere classified: Secondary | ICD-10-CM | POA: Diagnosis not present

## 2019-10-31 DIAGNOSIS — Z471 Aftercare following joint replacement surgery: Secondary | ICD-10-CM | POA: Diagnosis not present

## 2019-10-31 DIAGNOSIS — M25561 Pain in right knee: Secondary | ICD-10-CM | POA: Diagnosis not present

## 2019-10-31 DIAGNOSIS — M25661 Stiffness of right knee, not elsewhere classified: Secondary | ICD-10-CM | POA: Diagnosis not present

## 2019-10-31 DIAGNOSIS — M25512 Pain in left shoulder: Secondary | ICD-10-CM | POA: Diagnosis not present

## 2019-10-31 DIAGNOSIS — Z96651 Presence of right artificial knee joint: Secondary | ICD-10-CM | POA: Diagnosis not present

## 2019-10-31 DIAGNOSIS — M19012 Primary osteoarthritis, left shoulder: Secondary | ICD-10-CM | POA: Diagnosis not present

## 2019-12-15 ENCOUNTER — Encounter: Payer: Self-pay | Admitting: Sports Medicine

## 2019-12-15 ENCOUNTER — Ambulatory Visit: Payer: PPO | Admitting: Sports Medicine

## 2019-12-15 ENCOUNTER — Other Ambulatory Visit: Payer: Self-pay

## 2019-12-15 VITALS — BP 124/82 | Ht 71.0 in | Wt 190.0 lb

## 2019-12-15 DIAGNOSIS — M2141 Flat foot [pes planus] (acquired), right foot: Secondary | ICD-10-CM | POA: Diagnosis not present

## 2019-12-15 DIAGNOSIS — M2142 Flat foot [pes planus] (acquired), left foot: Secondary | ICD-10-CM

## 2019-12-15 DIAGNOSIS — M13871 Other specified arthritis, right ankle and foot: Secondary | ICD-10-CM

## 2019-12-15 DIAGNOSIS — G5753 Tarsal tunnel syndrome, bilateral lower limbs: Secondary | ICD-10-CM | POA: Diagnosis not present

## 2019-12-15 NOTE — Patient Instructions (Signed)
Thank you for coming in to see [place] today!  You have been diagnosed with ankle joint arthritis and tarsal tunnel syndrome.  Please see below to review our plan for today's visit:   1.   Please continue to hold off on tennis or aggravating activities for the next week. 2.   Please take ibuprofen 400 to 800 mg 3 times per day for the next week. 3.   Please plan to follow-up with [place] for custom orthotic fitting in the next 1 to 2 weeks.  Please bring your tennis shoes to the so we can fit in specifically for your shoes.   Please call the clinic at [phone] if your symptoms worsen or you have any concerns. It was our pleasure to serve you.       Dr. Dagoberto Ligas Dr[facility]

## 2019-12-15 NOTE — Progress Notes (Signed)
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   Patient states that he was playing tennis on Sunday, 8/22, and on the way to the game he began to have pain over his lateral ankle.  He did not notice any specific pole or injury that set off his symptoms, however it progressively worsened over the course of the game and so he stopped playing.  He iced it and it felt better the following day so he played again but had recurrence of his pain.  He has noticed some mild swelling.  He describes it as an aching pain.  He has not tried any medications.  Patient also notes that for a long time, he has had a burning sensation on the bottom of both of his feet after he has been exercising for over an hour.  He is wondering what this might be.  Review of Systems:  Per HPI.   [place], medications and smoking status reviewed.      Objective:  Physical Exam: BP 124/82   Ht 5\' 11"  (1.803 m)   Wt 190 lb (86.2 kg)   BMI 26.50 kg/m   Gen: awake, alert, NAD, comfortable in exam room Pulm: breathing unlabored  R Ankle:  Inspection: No evidence of erythema, ecchymosis.  There is edema over his anterolateral ankle extending posterior to his lateral malleolus. Active ROM: Intact to plantar/dorsiflexion, inversion/eversion  Passive ROM: Intact and non-painful to plantar/dorsiflexion,inversion/eversion. No pain with flexion/extension great toe  Strength: 5/5 strength without pain to resisted plantarflexion/dorsiflexion, inversion, eversion  Medial/lateral malleolus: Nontender Base 5th Metatarsal: Mildly tender at the base of the third and fourth metatarsals Navicular: Nontender  ATFL, CFL, PTFL: Tenderness in the area of the ATFL Mortise/tib-talar joint: Positive tenderness to palpation Deltoid ligament: nontender Anterior drawer: No laxity or  pain  Talar/reverse talar tilt: No laxity or pain  Gait: Patient with significant overpronation bilaterally with near loss of longitudinal arch with standing.  Mild loss of transverse arch.  Limited US examination of the right ankle: Examination of the lateral ankle shows a joint effusion in the tibiotalar joint along with osteophytic changes at that joint along with what appears to be avulsed piece of osteophyte at the tibiotalar joint at the proximal talus.   There is also a mild effusion over the talonavicular joint. The talar dome is visualized and there is diffuse loss of cartilage in the tibio talar articulation  Impression:  Findings of significant arthritic change and effusion in the tibio-talar joint and in the talo-navicular joint  Ultrasound and interpretation by Dr. Buena Irish and Wolfgang Phoenix. Fields, MD    Assessment & Plan:  1.  Tibiotalar arthritis 2.  Lateral ankle joint arthritis 3.  Tarsal tunnel syndrome 4.  Pes planus  Patient with findings consistent with significant ankle arthritis.  Suspect that patient's proprioception is compromised after his recent knee surgery and this may be playing a factor in his pain.  Recommend relative rest from activities such as tennis, oral NSAIDs/Tylenol, and gradual resumption of play as pain allows.  We will also plan to present for a custom orthotic which will relieve some of the pressure over the lateral ankle given his overpronation.  Patient also mentioned neuropathic pain over the plantar aspect of his feet after prolonged exercise.  This is most consistent with tarsal tunnel  syndrome and he does have significant overpronation bilaterally which would explain this.  We will plan to fit for orthotics as noted above.  No problem-specific Assessment & Plan notes found for this encounter.   No orders of the defined types were placed in this encounter.   No orders of the defined types were placed in this encounter.   Dagoberto Ligas,  MD [facility] Fellow 12/15/2019 2:55 PM  I observed and examined the patient with the [facility] Fellow and agree with assessment and plan.  Note reviewed and modified by me. Ila Mcgill, MD

## 2020-01-12 ENCOUNTER — Ambulatory Visit (INDEPENDENT_AMBULATORY_CARE_PROVIDER_SITE_OTHER): Payer: PPO | Admitting: Sports Medicine

## 2020-01-12 ENCOUNTER — Encounter: Payer: Self-pay | Admitting: Sports Medicine

## 2020-01-12 VITALS — BP 132/74 | Ht 71.0 in | Wt 190.0 lb

## 2020-01-12 DIAGNOSIS — M2141 Flat foot [pes planus] (acquired), right foot: Secondary | ICD-10-CM | POA: Diagnosis not present

## 2020-01-12 DIAGNOSIS — M2142 Flat foot [pes planus] (acquired), left foot: Secondary | ICD-10-CM

## 2020-01-12 DIAGNOSIS — G5753 Tarsal tunnel syndrome, bilateral lower limbs: Secondary | ICD-10-CM | POA: Diagnosis not present

## 2020-01-12 NOTE — Progress Notes (Signed)
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  He now presents for orthotic preparation.  Patient was fitted for a : standard, cushioned, semi-rigid orthotic. The orthotic was heated and afterward the patient stood on the orthotic blank positioned on the orthotic stand. The patient was positioned in subtalar neutral position and 10 degrees of ankle dorsiflexion in a weight bearing stance. After completion of molding, a stable base was applied to the orthotic blank. The blank was ground to a stable position for weight bearing. Size: 10 Base: blue EVA Posting: none Additional orthotic padding: none  Patient's gait was analyzed after insertion of orthotics and to his shoes.  He does continue to have some right-sided supination, however is closer to neutral.  He reports that there very comfortable on testing.  Encourage patient to walk around them over the next few weeks, and to return if any concerns or need for revision.  Dagoberto Ligas, MD Sports Medicine Fellow, [place]  I observed and examined the patient with the [facility] resident and agree with assessment and plan.  Note reviewed and modified by me. Ila Mcgill, MD

## 2020-01-30 ENCOUNTER — Ambulatory Visit (INDEPENDENT_AMBULATORY_CARE_PROVIDER_SITE_OTHER): Payer: PPO | Admitting: Otolaryngology

## 2020-01-30 ENCOUNTER — Other Ambulatory Visit: Payer: Self-pay

## 2020-01-30 DIAGNOSIS — H6123 Impacted cerumen, bilateral: Secondary | ICD-10-CM

## 2020-01-30 DIAGNOSIS — H61813 Exostosis of external canal, bilateral: Secondary | ICD-10-CM | POA: Diagnosis not present

## 2020-01-30 NOTE — Progress Notes (Signed)
HPI: Glenn [name] is a 79 y.o. male who presents is referred by [facility] for evaluation of conductive hearing loss predominately on the left side.  He was also noted to have exostosis of both ear canals.  He apparently needs hearing aids but I do not have a hearing test available in the office today. He also complains of some clear mucus discharge from his nose that happens intermittently..  Past Medical History:  Diagnosis Date  . Atrial fibrillation ([place])    takes aspirin for this  . Brachial plexus injury 1995   Left arm  . Dysrhythmia 2009   A-fib  . HTN (hypertension)    6 years  . Injury of brachial plexus   . Prostate cancer ([place])   . Prostate cancer ([place])   . Torn rotator cuff   . Torn rotator cuff 2000   Right arm   Past Surgical History:  Procedure Laterality Date  . ETT  2006   High fitness but bp of 255  . Int fixation left forearm  01/19/1989  . KNEE SURGERY     left  . PROSTATECTOMY    . removal of basal cell    . TONSILLECTOMY    . TOTAL KNEE ARTHROPLASTY Right 08/09/2019   Procedure: TOTAL KNEE ARTHROPLASTY;  Surgeon: Paralee Cancel, MD;  Location: [facility];  Service: Orthopedics;  Laterality: Right;  70 mins   Social History   Socioeconomic History  . Marital status: Married    Spouse name: Not on file  . Number of children: Not on file  . Years of education: Not on file  . Highest education level: Not on file  Occupational History  . Occupation: Architectural technologist: Retired  Tobacco Use  . Smoking status: Former Smoker    Packs/day: 0.50    Years: 15.00    Pack years: 7.50    Quit date: 04/21/1985    Years since quitting: 34.8  . Smokeless tobacco: Never Used  Vaping Use  . Vaping Use: Never used  Substance and Sexual Activity  . Alcohol use: Yes    Comment: Social  . Drug use: No  . Sexual activity: Not on file  Other Topics Concern  . 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 Difficulty of Paying Living Expenses: Not on file  Food Insecurity:   . Worried About Charity fundraiser in the Last Year: Not on file  . Ran Out of Food in the Last Year: Not on file  Transportation Needs:   . Lack of Transportation (Medical): Not on file  . Lack of Transportation (Non-Medical): Not on file  Physical Activity:   . Days of Exercise per Week: Not on file  . Minutes of Exercise per Session: Not on file  Stress:   . Feeling of Stress : Not on file  Social Connections:   . Frequency of Communication with Friends and Family: Not on file  . Frequency of Social Gatherings with Friends and Family: Not on file  . Attends Religious Services: Not on file  . Active Member of Clubs or Organizations: Not on  file  . Attends Archivist Meetings: Not on file  . Marital Status: Not on file   Family History  Problem Relation Age of Onset  . Heart attack Father        MI  . Obesity Brother   . Parkinsonism Brother   . Prostate cancer Brother   . Colon cancer Neg Hx   . Esophageal cancer Neg Hx   . Stomach cancer Neg Hx   . Rectal cancer Neg Hx    No Known Allergies Prior to Admission medications   Medication Sig Start Date End Date Taking? Authorizing Provider  amoxicillin (AMOXIL) 500 MG capsule Take 2,000 mg by mouth See admin instructions. Take 4 capsules (2000 mg) by mouth 1 hour prior to dental procedures.    [provider]  atorvastatin (LIPITOR) 10 MG tablet TAKE ONE TABLET BY MOUTH DAILY 09/02/19   Evans Lance, MD  docusate sodium (COLACE) 100 MG capsule Take 1 capsule (100 mg total) by mouth 2 (two) times daily. 08/10/19   Danae Orleans, PA-C  doxycycline (VIBRAMYCIN) 100 MG capsule Take 1 capsule (100 mg total) by  mouth 2 (two) times daily. 05/08/19   Zigmund Gottron, NP  ELIQUIS 5 MG TABS tablet TAKE ONE TABLET BY MOUTH TWICE A DAY 08/25/19   Evans Lance, MD  ferrous sulfate (FERROUSUL) 325 (65 FE) MG tablet Take 1 tablet (325 mg total) by mouth 3 (three) times daily with meals for 14 days. 08/10/19 08/24/19  Danae Orleans, PA-C  hydrochlorothiazide (HYDRODIURIL) 25 MG tablet TAKE ONE TABLET BY MOUTH DAILY *PLEASE KEEP UPCOMING APPOINTMENT IN NOVEMBER WITH DOCTOR TAYLOR BEFORE FURTHER REFILLS* Patient taking differently: Take 25 mg by mouth daily.  06/03/19   Evans Lance, MD  HYDROcodone-acetaminophen (NORCO) 5-325 MG tablet Take 1-2 tablets by mouth every 4 (four) hours as needed for moderate pain or severe pain. 08/10/19   Danae Orleans, PA-C  lisinopril (ZESTRIL) 20 MG tablet Take 1 tablet (20 mg total) by mouth daily. 06/27/19   Evans Lance, MD  methocarbamol (ROBAXIN) 500 MG tablet Take 1 tablet (500 mg total) by mouth every 6 (six) hours as needed for muscle spasms. 08/10/19   Danae Orleans, PA-C  polyethylene glycol (MIRALAX / GLYCOLAX) 17 g packet Take 17 g by mouth 2 (two) times daily. 08/10/19   Danae Orleans, PA-C  tetrahydrozoline 0.05 % ophthalmic solution Place 1-2 drops into both eyes 3 (three) times daily as needed (dry/irritated eyes.).    [provider]     Positive ROS: Otherwise negative  All other systems have been reviewed and were otherwise negative with the exception of those mentioned in the HPI and as above.  Physical Exam: Constitutional: Alert, well-appearing, no acute distress Ears: External ears without lesions or tenderness.  Patient with severe bony exostosis involving both ear canals left side slightly worse than right.  But they are occluding 80% of the ear canal close to the TM.  And barely able to visualize the TM.  On tuning fork testing with the 512 tuning fork AC was greater than BC on the right side and AC was equivocal to [facility] on the left side.  He  had minimal wax within the ear canal that was cleaned in the office with forceps.  The cartilaginous portion of the ear canals are normal size bilaterally. Nasal: External nose without lesions. Septum with minimal deformity and mild rhinitis.. Clear nasal passages otherwise. Oral: Lips and gums without lesions. Tongue and  palate mucosa without lesions. Posterior oropharynx clear. Neck: No palpable adenopathy or masses Respiratory: Breathing comfortably  Skin: No facial/neck lesions or rash noted.  Cerumen impaction removal  Date/Time: 01/30/2020 5:06 PM Performed by: Rozetta Nunnery, MD Authorized by: Rozetta Nunnery, MD   Consent:    Consent obtained:  Verbal   Consent given by:  Patient   Risks discussed:  Pain and bleeding Procedure details:    Location:  L ear and R ear   Procedure type: forceps   Post-procedure details:    Inspection:  TM intact and canal normal   Hearing quality:  Improved   Patient tolerance of procedure:  Tolerated well, no immediate complications Comments:     Patient with minimal wax buildup on both sides but has severe bony exostosis of both ear canals which occludes 80% of the ear canal adjacent to the TM.    Assessment: Severe bony exostosis bilaterally left side worse than right.  Plan: Reviewed the findings with him in the office today.  If he has significant conductive loss could consider removal of the bony exostosis on the left side but this would require surgery. We will plan on obtaining his audiogram and will have him call us next week concerning review of the audiogram.   Radene Journey, MD   CC:

## 2020-02-02 DIAGNOSIS — H903 Sensorineural hearing loss, bilateral: Secondary | ICD-10-CM | POA: Diagnosis not present

## 2020-02-07 ENCOUNTER — Other Ambulatory Visit: Payer: Self-pay

## 2020-02-07 ENCOUNTER — Ambulatory Visit: Payer: PPO | Admitting: Sports Medicine

## 2020-02-07 ENCOUNTER — Ambulatory Visit
Admission: RE | Admit: 2020-02-07 | Discharge: 2020-02-07 | Disposition: A | Discharge status: Home / Self Care | Payer: PPO | Source: Ambulatory Visit | Attending: Sports Medicine | Admitting: Sports Medicine

## 2020-02-07 ENCOUNTER — Encounter: Payer: Self-pay | Admitting: Sports Medicine

## 2020-02-07 VITALS — BP 125/94 | Ht 70.0 in | Wt 190.0 lb

## 2020-02-07 DIAGNOSIS — G8929 Other chronic pain: Secondary | ICD-10-CM

## 2020-02-07 DIAGNOSIS — M7989 Other specified soft tissue disorders: Secondary | ICD-10-CM | POA: Diagnosis not present

## 2020-02-07 DIAGNOSIS — M25571 Pain in right ankle and joints of right foot: Secondary | ICD-10-CM | POA: Diagnosis not present

## 2020-02-07 DIAGNOSIS — M25572 Pain in left ankle and joints of left foot: Secondary | ICD-10-CM | POA: Diagnosis not present

## 2020-02-07 NOTE — Progress Notes (Signed)
    SUBJECTIVE:   CHIEF COMPLAINT / HPI:   Right Ankle Pain Glenn [name] is a very pleasant 79 year old male who presents today for follow-up due to right ankle pain and swelling.  He has noticed that his pain first started 2 months ago after playing tennis again hurting mistakes like it feels like he rolled his ankle but he did not roll his ankle or sustain any known injury.  Says the swelling is diffuse around the ankle and it does become stiff and hard to move.  Motion such as side to side and impact 10 to make it worse.  He states he can walk on it fine to play golf without any pain however after playing tennis he does get swollen and painful and stiff.  This is happening several times after playing tennis and he has backed off from playing several times.  It does get better with rest and wearing an ankle brace however when he increases his tennis activity the pain and swelling returns.  We also fitted him with orthotics at his last appointment he states he feels like this has not really made any difference.  PERTINENT  PMH / PSH:  Hypertension, history of osteoarthritis of the right ankle, atrial fibrillation  OBJECTIVE:   BP (!) 125/94   Ht 5\' 10"  (1.778 m)   Wt 190 lb (86.2 kg)   BMI 27.26 kg/m   [facility] 02/07/2020  Frequency of aerobic exercise (# of days/week) 6  Average time in minutes 35  Frequency of strengthening activities (# of days/week) 0   Ankle/Foot, Right: No visible erythema, moderate diffuse swelling, no ecchymosis, or bony deformity. He has stiffness with active ROM. Strength is 5/5 in all directions.Tenderness below the lateral malleolus and below the medial malleolus. Mild pain over talonavicular joint and tenderness at talotibial joint. Able to walk 4 steps.    ASSESSMENT/PLAN:   Right ankle pain Given his clinical presentation, history, physical exam his symptoms are most likely due to acute exacerbation of osteoarthritis of the right  ankle that was documented as seen on ultrasound.  We will get 3 view x-Bre[place] to ensure this is the case.  In the meantime he can continue with his ankle brace when he is active and produce a modified tennis activity.  We also encouraged him to use Voltaren gel up to 4 times a day per as needed per pain and to ice his ankle after activity.  We will have him follow-up with [place] after x-[place] results.     Nuala Alpha, DO PGY-4, Sports Medicine Fellow [place]  I observed and examined the patient with the [facility] resident and agree with assessment and plan.  Note reviewed and modified by me. Tracey Harries, MD

## 2020-02-07 NOTE — Patient Instructions (Signed)
It was great to see you today! Thank you for letting me participate in your care!  Today, we discussed your continued right ankle pain and swelling due to arthritis. Please begin using Voltaren gel 4 times per day as needed for pain and swelling. Please continue to wear the ankle brace when you are active. I would avoid tennis and any other aggravating activities for the next few weeks. We will call you with the x-[place] result.  Be well, Harolyn Rutherford, DO PGY-4, Sports Medicine Fellow [place]

## 2020-02-07 NOTE — Assessment & Plan Note (Signed)
Given his clinical presentation, history, physical exam his symptoms are most likely due to acute exacerbation of osteoarthritis of the right ankle that was documented as seen on ultrasound.  We will get 3 view x-Glenn [name] to ensure this is the case.  In the meantime he can continue with his ankle brace when he is active and produce a modified tennis activity.  We also encouraged him to use Voltaren gel up to 4 times a day per as needed per pain and to ice his ankle after activity.  We will have him follow-up with [place] after x-Glenn [name] results.

## 2020-02-16 DIAGNOSIS — H903 Sensorineural hearing loss, bilateral: Secondary | ICD-10-CM | POA: Diagnosis not present

## 2020-02-21 DIAGNOSIS — R1031 Right lower quadrant pain: Secondary | ICD-10-CM | POA: Diagnosis not present

## 2020-02-27 ENCOUNTER — Other Ambulatory Visit: Payer: Self-pay | Admitting: Family Medicine

## 2020-02-27 DIAGNOSIS — D72829 Elevated white blood cell count, unspecified: Secondary | ICD-10-CM

## 2020-02-27 DIAGNOSIS — R1031 Right lower quadrant pain: Secondary | ICD-10-CM

## 2020-02-28 ENCOUNTER — Ambulatory Visit
Admission: RE | Admit: 2020-02-28 | Discharge: 2020-02-28 | Disposition: A | Discharge status: Home / Self Care | Payer: PPO | Source: Ambulatory Visit | Attending: Family Medicine | Admitting: Family Medicine

## 2020-02-28 DIAGNOSIS — R1031 Right lower quadrant pain: Secondary | ICD-10-CM

## 2020-02-28 DIAGNOSIS — N281 Cyst of kidney, acquired: Secondary | ICD-10-CM | POA: Diagnosis not present

## 2020-02-28 DIAGNOSIS — K529 Noninfective gastroenteritis and colitis, unspecified: Secondary | ICD-10-CM | POA: Diagnosis not present

## 2020-02-28 DIAGNOSIS — D72819 Decreased white blood cell count, unspecified: Secondary | ICD-10-CM | POA: Diagnosis not present

## 2020-02-28 DIAGNOSIS — D72829 Elevated white blood cell count, unspecified: Secondary | ICD-10-CM | POA: Diagnosis not present

## 2020-02-28 MED ORDER — IOPAMIDOL (ISOVUE-300) INJECTION 61%
100.0000 mL | Freq: Once | INTRAVENOUS | Status: AC | PRN
  Administered 2020-02-28: 100 mL via INTRAVENOUS

## 2020-03-26 ENCOUNTER — Other Ambulatory Visit: Payer: Self-pay | Admitting: Internal Medicine

## 2020-03-26 DIAGNOSIS — L821 Other seborrheic keratosis: Secondary | ICD-10-CM | POA: Diagnosis not present

## 2020-03-26 DIAGNOSIS — I8311 Varicose veins of right lower extremity with inflammation: Secondary | ICD-10-CM | POA: Diagnosis not present

## 2020-03-26 DIAGNOSIS — D692 Other nonthrombocytopenic purpura: Secondary | ICD-10-CM | POA: Diagnosis not present

## 2020-03-26 DIAGNOSIS — I872 Venous insufficiency (chronic) (peripheral): Secondary | ICD-10-CM | POA: Diagnosis not present

## 2020-03-26 DIAGNOSIS — C44311 Basal cell carcinoma of skin of nose: Secondary | ICD-10-CM | POA: Diagnosis not present

## 2020-03-26 DIAGNOSIS — I8312 Varicose veins of left lower extremity with inflammation: Secondary | ICD-10-CM | POA: Diagnosis not present

## 2020-03-26 DIAGNOSIS — L281 Prurigo nodularis: Secondary | ICD-10-CM | POA: Diagnosis not present

## 2020-03-26 DIAGNOSIS — D1801 Hemangioma of skin and subcutaneous tissue: Secondary | ICD-10-CM | POA: Diagnosis not present

## 2020-04-05 DIAGNOSIS — H524 Presbyopia: Secondary | ICD-10-CM | POA: Diagnosis not present

## 2020-04-05 DIAGNOSIS — H52203 Unspecified astigmatism, bilateral: Secondary | ICD-10-CM | POA: Diagnosis not present

## 2020-04-05 DIAGNOSIS — H25013 Cortical age-related cataract, bilateral: Secondary | ICD-10-CM | POA: Diagnosis not present

## 2020-04-05 DIAGNOSIS — H5213 Myopia, bilateral: Secondary | ICD-10-CM | POA: Diagnosis not present

## 2020-04-05 DIAGNOSIS — H2513 Age-related nuclear cataract, bilateral: Secondary | ICD-10-CM | POA: Diagnosis not present

## 2020-04-25 DIAGNOSIS — C44311 Basal cell carcinoma of skin of nose: Secondary | ICD-10-CM | POA: Diagnosis not present

## 2020-04-25 DIAGNOSIS — Z85828 Personal history of other malignant neoplasm of skin: Secondary | ICD-10-CM | POA: Diagnosis not present

## 2020-05-25 ENCOUNTER — Other Ambulatory Visit: Payer: Self-pay | Admitting: Internal Medicine

## 2020-05-29 ENCOUNTER — Other Ambulatory Visit: Payer: Self-pay | Admitting: Internal Medicine

## 2020-05-31 DIAGNOSIS — I4891 Unspecified atrial fibrillation: Secondary | ICD-10-CM | POA: Diagnosis not present

## 2020-05-31 DIAGNOSIS — E7800 Pure hypercholesterolemia, unspecified: Secondary | ICD-10-CM | POA: Diagnosis not present

## 2020-05-31 DIAGNOSIS — Z0000 Encounter for general adult medical examination without abnormal findings: Secondary | ICD-10-CM | POA: Diagnosis not present

## 2020-05-31 DIAGNOSIS — Z23 Encounter for immunization: Secondary | ICD-10-CM | POA: Diagnosis not present

## 2020-05-31 DIAGNOSIS — Z8546 Personal history of malignant neoplasm of prostate: Secondary | ICD-10-CM | POA: Diagnosis not present

## 2020-05-31 DIAGNOSIS — I10 Essential (primary) hypertension: Secondary | ICD-10-CM | POA: Diagnosis not present

## 2020-06-07 ENCOUNTER — Other Ambulatory Visit: Payer: Self-pay | Admitting: Internal Medicine

## 2020-06-07 NOTE — Telephone Encounter (Signed)
Eliquis 5mg  refill request received. Patient is 80 years old, weight-86.2kg, Crea-1.18 on 07/26/2019, Diagnosis-Afib, and last seen by Tommye Standard on 07/26/2019 and has a recall for April. Dose is appropriate based on dosing criteria. Will send in refill to requested pharmacy.

## 2020-08-02 ENCOUNTER — Other Ambulatory Visit: Payer: Self-pay

## 2020-08-02 ENCOUNTER — Ambulatory Visit: Payer: PPO | Admitting: Internal Medicine

## 2020-08-02 ENCOUNTER — Encounter: Payer: Self-pay | Admitting: Internal Medicine

## 2020-08-02 VITALS — BP 148/90 | HR 59 | Ht 70.0 in | Wt 186.0 lb

## 2020-08-02 DIAGNOSIS — I10 Essential (primary) hypertension: Secondary | ICD-10-CM

## 2020-08-02 DIAGNOSIS — I482 Chronic atrial fibrillation, unspecified: Secondary | ICD-10-CM | POA: Diagnosis not present

## 2020-08-02 NOTE — Progress Notes (Signed)
HPI Mr. Pangilinan returns today for followup.He is a very pleasant 80 year old man with chronic atrial fibrillation, hypertension, on chronic systemic anticoagulation, who also has conduction system disease as evidenced by his rate controlled A. fib on no AV nodal blocking drugs. He also hadchronic peripheral edema which resolved with stopping of amlodipine. No other complaints today.He has switchedto an [place] and is tolerating the Eliquis. No syncope. He has remained active and his bp has been in the 130's at home. No Known Allergies   Current Outpatient Medications  Medication Sig Dispense Refill  . amoxicillin (AMOXIL) 500 MG capsule Take 2,000 mg by mouth See admin instructions. Take 4 capsules (2000 mg) by mouth 1 hour prior to dental procedures.    Marland Kitchen atorvastatin (LIPITOR) 10 MG tablet TAKE ONE TABLET BY MOUTH DAILY 90 tablet 2  . ELIQUIS 5 MG TABS tablet TAKE ONE TABLET BY MOUTH TWICE A DAY 180 tablet 0  . hydrochlorothiazide (HYDRODIURIL) 25 MG tablet TAKE ONE TABLET BY MOUTH DAILY 90 tablet 0  . lisinopril (ZESTRIL) 20 MG tablet TAKE ONE TABLET BY MOUTH DAILY 90 tablet 1  . tetrahydrozoline 0.05 % ophthalmic solution Place 1-2 drops into both eyes 3 (three) times daily as needed (dry/irritated eyes.).     No current facility-administered medications for this visit.     Past Medical History:  Diagnosis Date  . Atrial fibrillation ([place])    takes aspirin for this  . Brachial plexus injury 1995   Left arm  . Dysrhythmia 2009   A-fib  . HTN (hypertension)    6 years  . Injury of brachial plexus   . Prostate cancer ([place])   . Prostate cancer ([place])   . Torn rotator cuff   . Torn rotator cuff 2000   Right arm    ROS:   All systems reviewed and negative except as noted in the HPI.   Past Surgical History:  Procedure Laterality Date  . ETT  2006   High fitness but bp of 255  . Int fixation left forearm  01/19/1989  . KNEE SURGERY     left  . PROSTATECTOMY    .  removal of basal cell    . TONSILLECTOMY    . TOTAL KNEE ARTHROPLASTY Right 08/09/2019   Procedure: TOTAL KNEE ARTHROPLASTY;  Surgeon: Paralee Cancel, MD;  Location: [facility];  Service: Orthopedics;  Laterality: Right;  70 mins     Family History  Problem Relation Age of Onset  . Heart attack Father        MI  . Obesity Brother   . Parkinsonism Brother   . Prostate cancer Brother   . Colon cancer Neg Hx   . Esophageal cancer Neg Hx   . Stomach cancer Neg Hx   . Rectal cancer Neg Hx      Social History   Socioeconomic History  . Marital status: Married    Spouse name: Not on file  . Number of children: Not on file  . Years of education: Not on file  . Highest education level: Not on file  Occupational History  . Occupation: Architectural technologist: Retired  Tobacco Use  . Smoking status: Former Smoker    Packs/day: 0.50    Years: 15.00    Pack years: 7.50    Quit date: 04/21/1985    Years since quitting: 35.3  . Smokeless tobacco: Never Used  Vaping Use  . Vaping Use: Never used  Substance and  Sexual Activity  . Alcohol use: Yes    Comment: Social  . Drug use: No  . Sexual activity: Not on file  Other Topics Concern  . 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   married to [place]; 2 male and 1 male children;    multiple grandkids;    non smoker - quit 25 years ago   social ETOH   Social Determinants of Health   Financial Resource Strain: Not on file  Food Insecurity: Not on file  Transportation Needs: Not on file  Physical Activity: Not on file  Stress: Not on file  Social Connections: Not on file  Intimate Partner Violence: Not on file     BP (!) 148/90   Pulse (!) 59   Ht 5\' 10"  (1.778 m)   Wt 186 lb (84.4 kg)   SpO2 (!) 9%   BMI 26.69 kg/m   Physical Exam:  Well appearing NAD HEENT: Unremarkable Neck:  No JVD, no thyromegally Lymphatics:  No  adenopathy Back:  No CVA tenderness Lungs:  Clear with no wheezes HEART:  IRegular rate rhythm, no murmurs, no rubs, no clicks Abd:  soft, positive bowel sounds, no organomegally, no rebound, no guarding Ext:  2 plus pulses, no edema, no cyanosis, no clubbing Skin:  No rashes no nodules Neuro:  CN II through XII intact, motor grossly intact  EKG - atrial fib with a controlled VR   Assess/Plan: 1. Chronic atrial fib - his VR is well controlled. He will continue his current meds. His rates are controlled. 2. HTN -his bp has been well controlled. Up today. No change in meds. He will call us if his bp goes up. 3. Coags - he has had conjunctival bleeding but no other. I would not stop his systemic anti-coagulation at this point as CHADSVASC is 3.   Carleene Overlie Ki [place]bo,MD

## 2020-08-02 NOTE — Patient Instructions (Signed)

## 2020-08-25 ENCOUNTER — Other Ambulatory Visit: Payer: Self-pay | Admitting: Internal Medicine

## 2020-09-01 ENCOUNTER — Other Ambulatory Visit: Payer: Self-pay | Admitting: Internal Medicine

## 2020-09-03 NOTE — Telephone Encounter (Signed)
Pt's age 80, wt 84.4 kg, SCr 1.10, CrCl 66.55, last ov w/ GT 10/02/20.

## 2020-09-18 ENCOUNTER — Ambulatory Visit: Payer: PPO | Admitting: Internal Medicine

## 2020-09-18 ENCOUNTER — Other Ambulatory Visit: Payer: Self-pay | Admitting: Internal Medicine

## 2020-11-26 ENCOUNTER — Other Ambulatory Visit: Payer: Self-pay | Admitting: Internal Medicine

## 2020-11-26 NOTE — Telephone Encounter (Signed)
Eliquis mg refill request received. Patient is 80 years old, weight-84.4 kg, Crea- 1.1 and last seen by  GT on 08/02/20. Dose is appropriate based on dosing criteria. Will send in refill to requested pharmacy.

## 2020-12-21 DIAGNOSIS — R208 Other disturbances of skin sensation: Secondary | ICD-10-CM | POA: Diagnosis not present

## 2020-12-21 DIAGNOSIS — I10 Essential (primary) hypertension: Secondary | ICD-10-CM | POA: Diagnosis not present

## 2020-12-21 DIAGNOSIS — M7500 Adhesive capsulitis of unspecified shoulder: Secondary | ICD-10-CM | POA: Diagnosis not present

## 2020-12-21 DIAGNOSIS — I4891 Unspecified atrial fibrillation: Secondary | ICD-10-CM | POA: Diagnosis not present

## 2020-12-21 DIAGNOSIS — M170 Bilateral primary osteoarthritis of knee: Secondary | ICD-10-CM | POA: Diagnosis not present

## 2020-12-21 DIAGNOSIS — E7800 Pure hypercholesterolemia, unspecified: Secondary | ICD-10-CM | POA: Diagnosis not present

## 2021-01-14 IMAGING — DX DG ANKLE COMPLETE 3+V*R*
3 series · 3 of 3 positions shown · non-contrast
Comparison: Right calcaneus series 05/31/2019.

CLINICAL DATA: Right ankle pain.  No known injury.

EXAM:
RIGHT ANKLE - COMPLETE 3+ VIEW

[dg ankle complete right (1 of 3)]
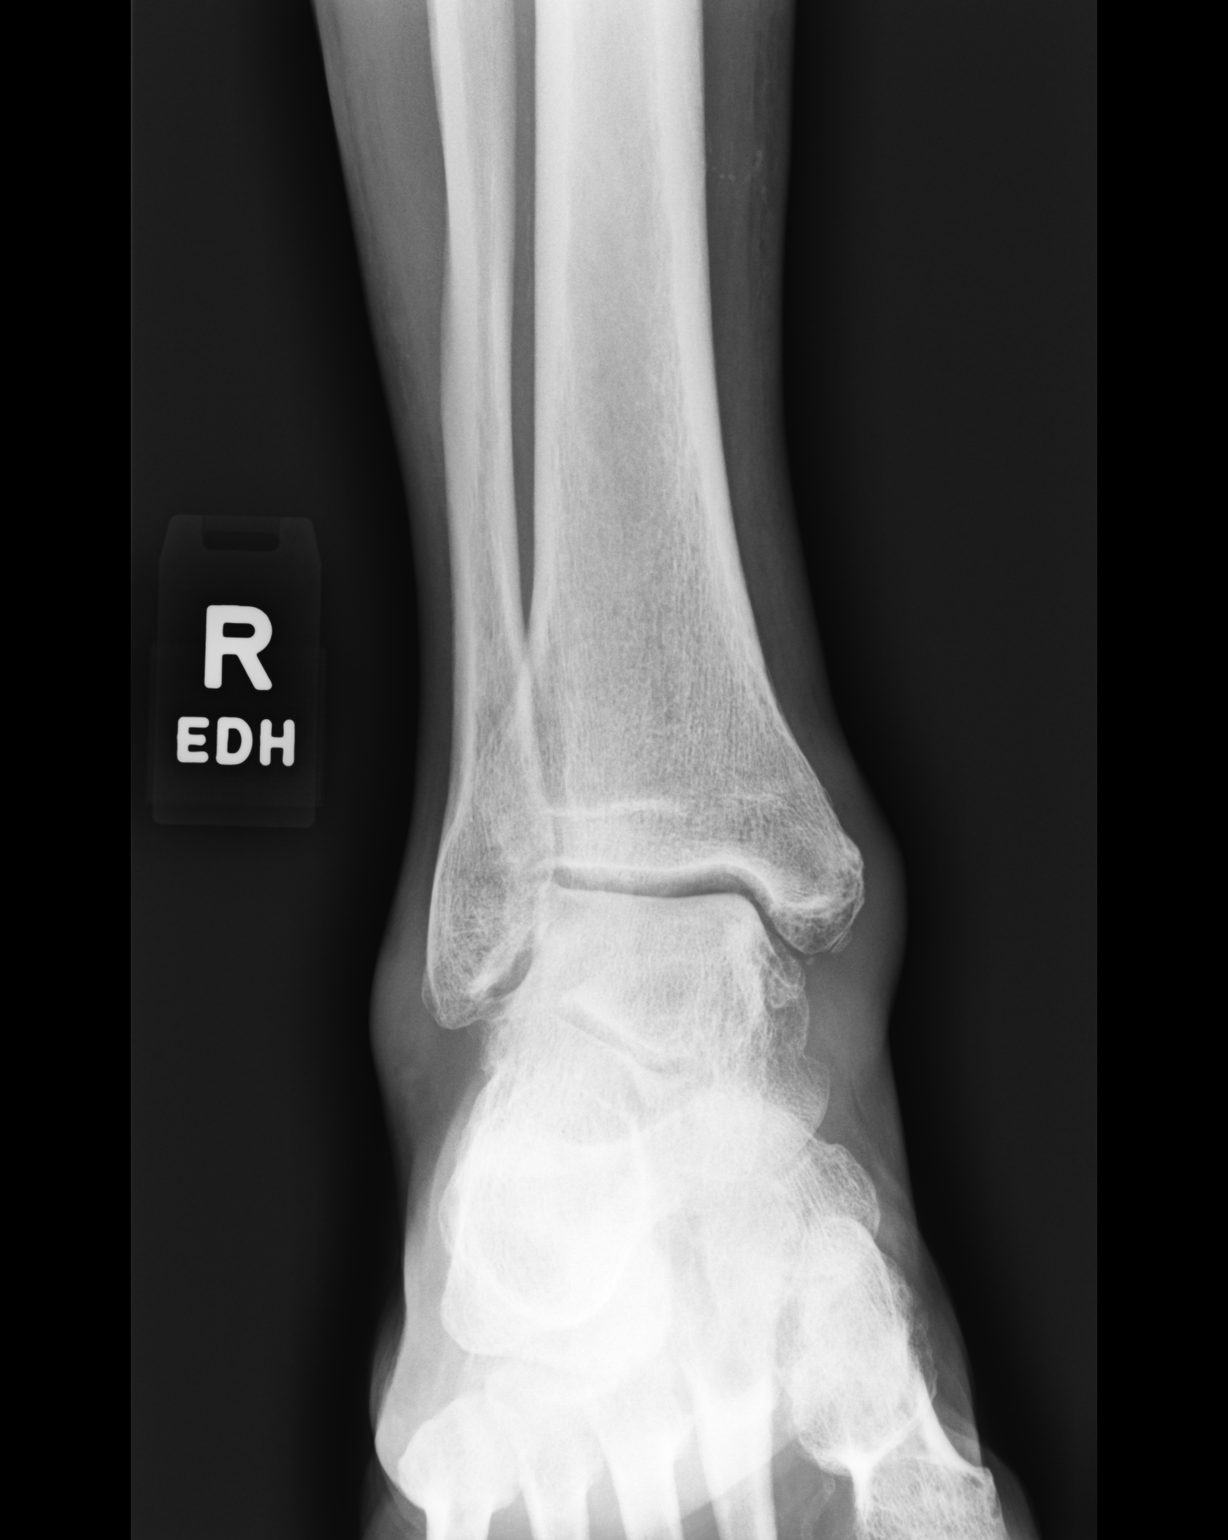

[dg ankle complete right (2 of 3)]
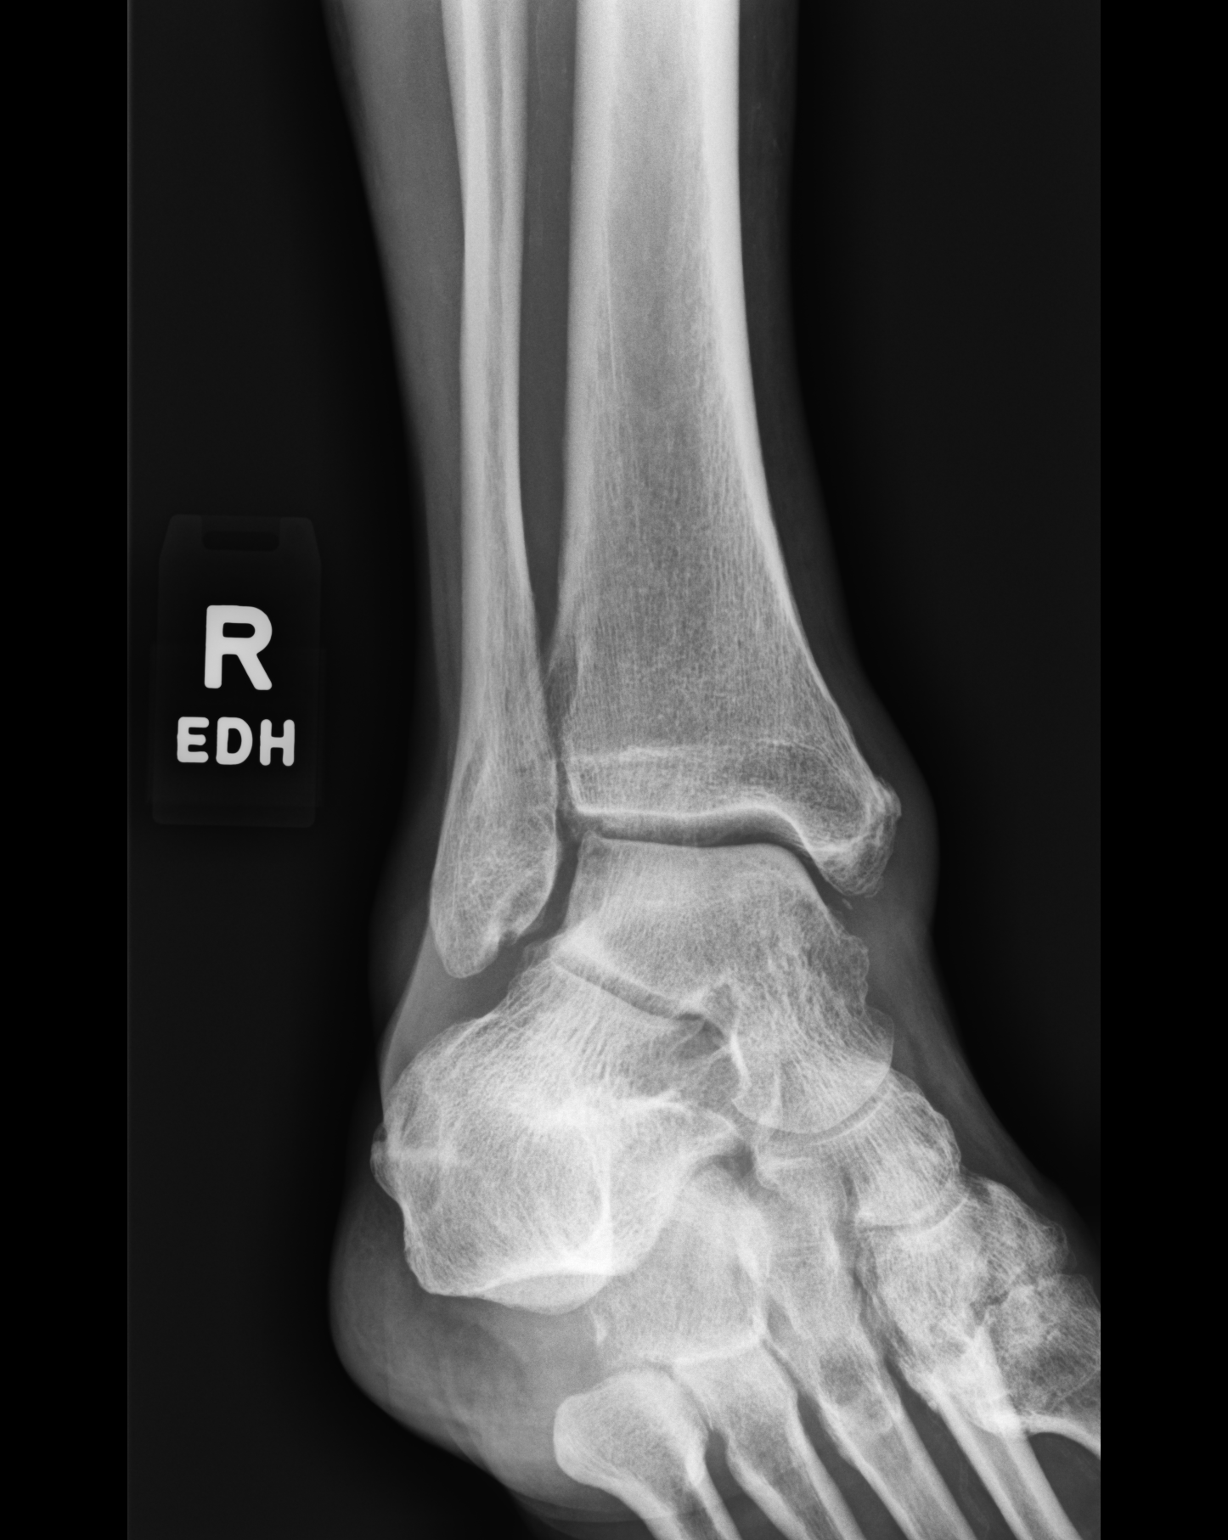

[dg ankle complete right (3 of 3)]
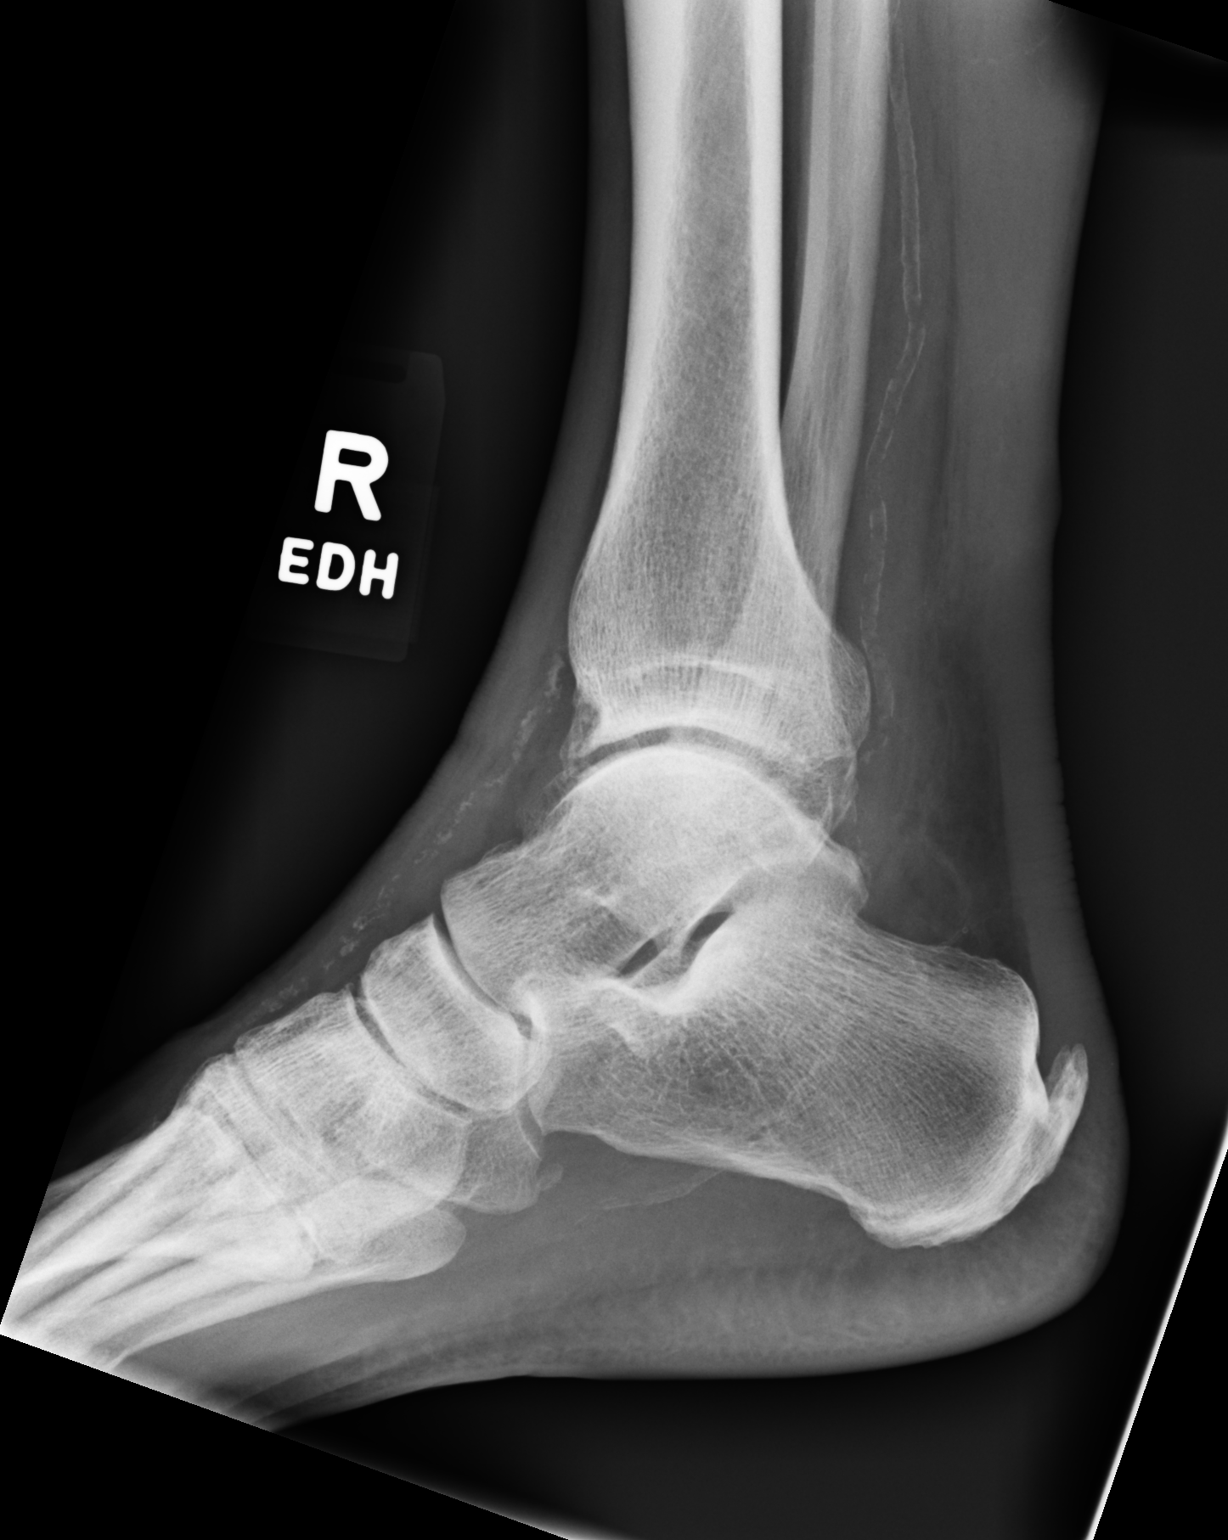

[3 of 3 positions shown; findings below may reference images not displayed]

FINDINGS: Diffuse soft tissue swelling. Tiny corticated bony densities noted
adjacent to the medial malleolus, most likely old tiny fracture
fragments. No acute bony abnormality identified. Diffuse
degenerative change calcaneal spurring. Peripheral atherosclerotic
vascular calcification.
IMPRESSION: 1. Diffuse soft tissue swelling. Tiny corticated bony densities
noted adjacent to the medial malleolus, most likely old tiny
fracture fragments. No acute bony abnormality identified.

2.  Peripheral vascular disease.

## 2021-02-04 IMAGING — CT CT ABD-PELV W/ CM
2 of 5 series · 14 of 46 positions shown, 16 images · IV contrast (iopamidol)
Comparison: None.

CLINICAL DATA: Right lower quadrant pain for several weeks with
elevated white blood cell count

EXAM:
CT ABDOMEN AND PELVIS WITH CONTRAST
TECHNIQUE: Multidetector CT imaging of the abdomen and pelvis was performed
using the standard protocol following bolus administration of
intravenous contrast.
CONTRAST:  100mL LP8YJ3-L00 IOPAMIDOL (LP8YJ3-L00) INJECTION 61%

[Series 2: abd pelvis 5.00 br40 s3 axial · axial · 0.74mm/px · z∈[+1200,+1620]mm · 11 of 94 slices shown, 13 images]
[im 5/94  soft-tissue]
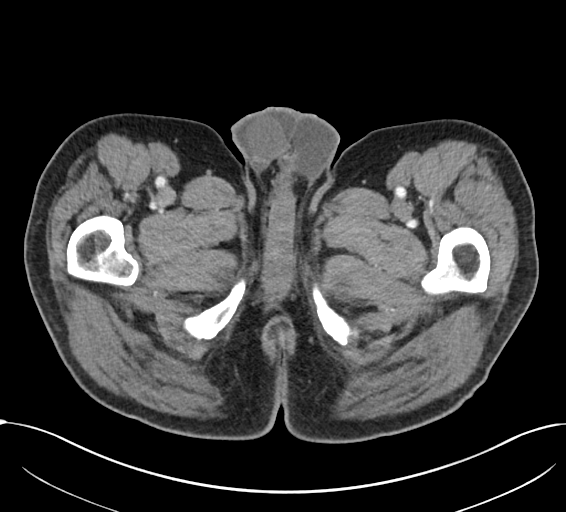
[im 5/94  bone]
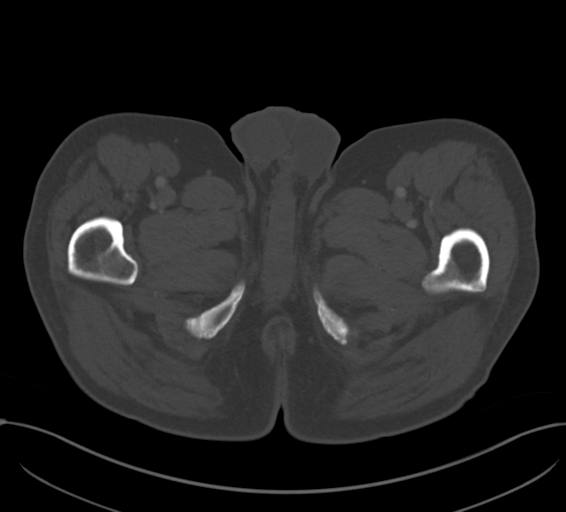
[im 15/94  soft-tissue]
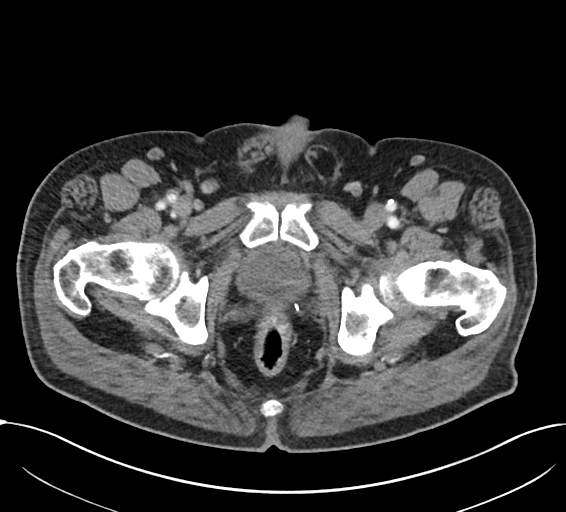
[im 25/94  soft-tissue]
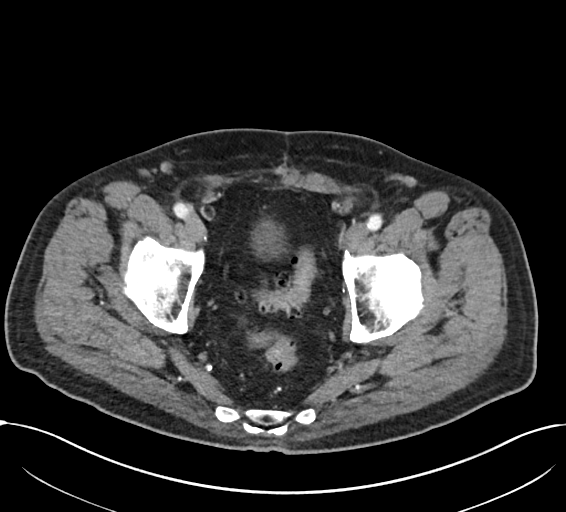
[im 30/94  soft-tissue]
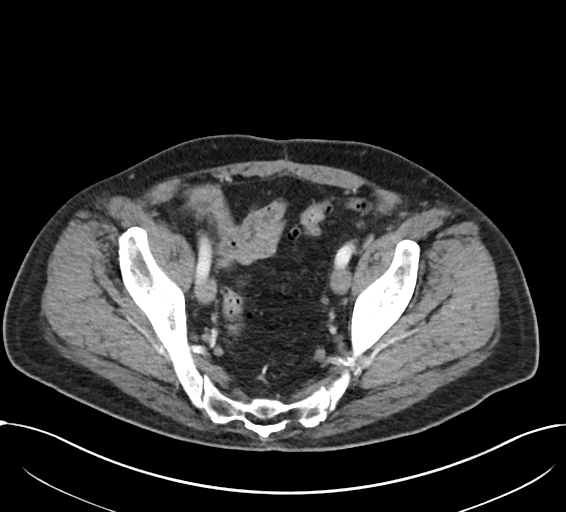
[im 40/94  soft-tissue]
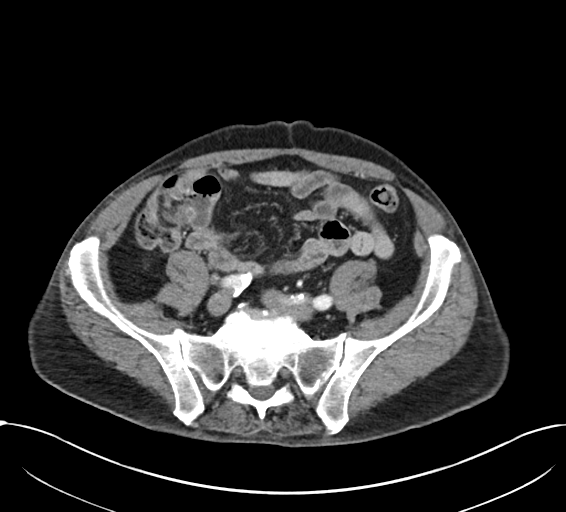
[im 49/94  soft-tissue]
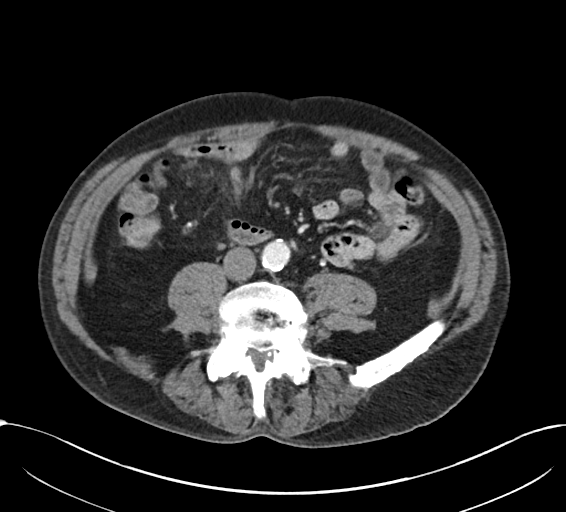
[im 54/94  soft-tissue]
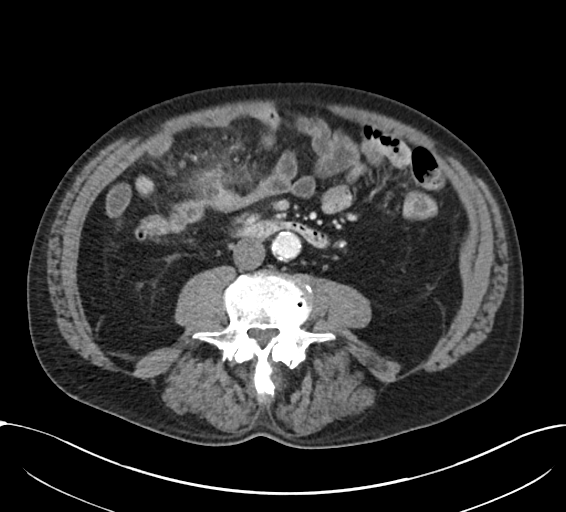
[im 64/94  soft-tissue]
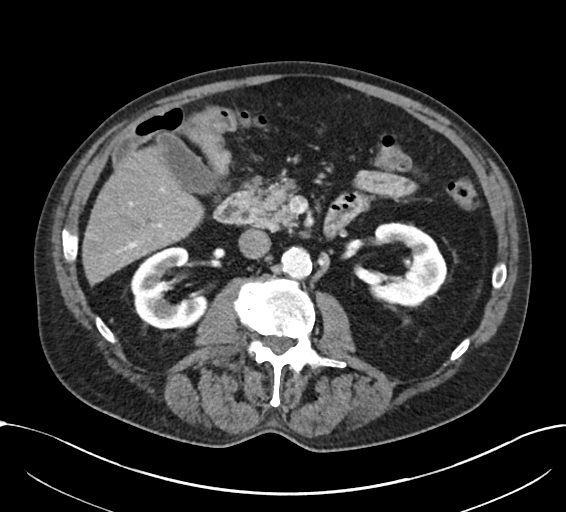
[im 69/94  soft-tissue]
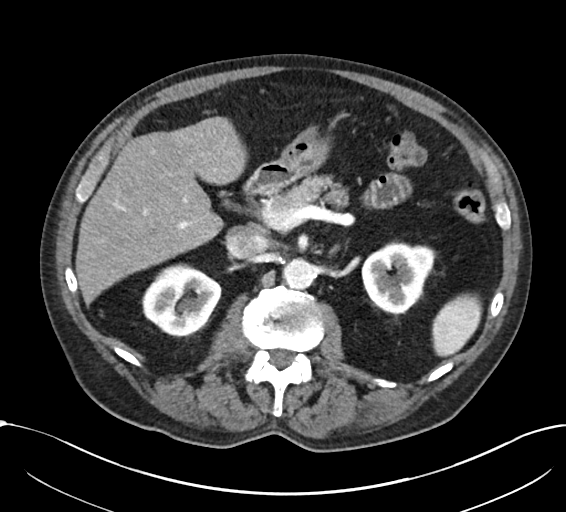
[im 69/94  bone]
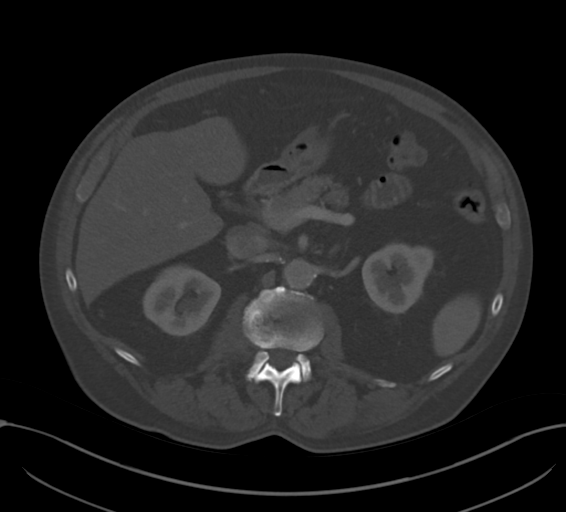
[im 79/94  soft-tissue]
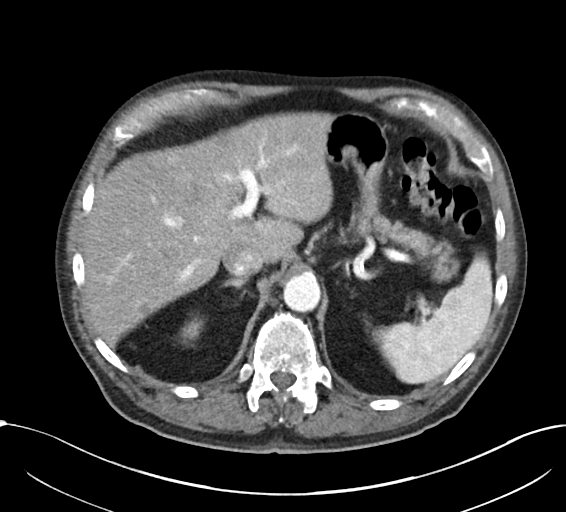
[im 89/94  soft-tissue]
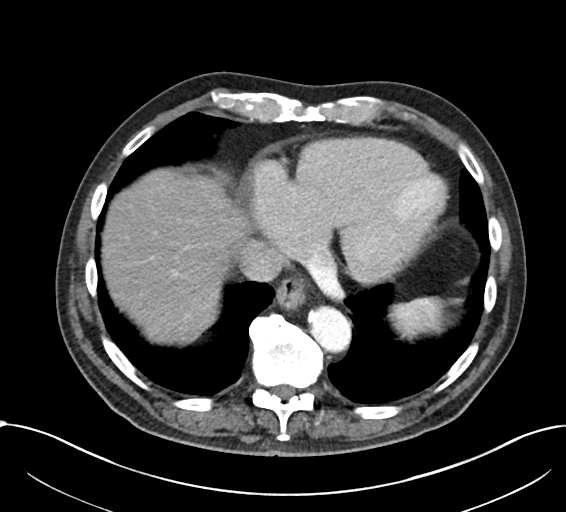

[Series 6: abd pelvis 2.00 br40 s3 cor · coronal · 0.80mm/px · 3 of 151 slices shown]
[im 51/151  soft-tissue]
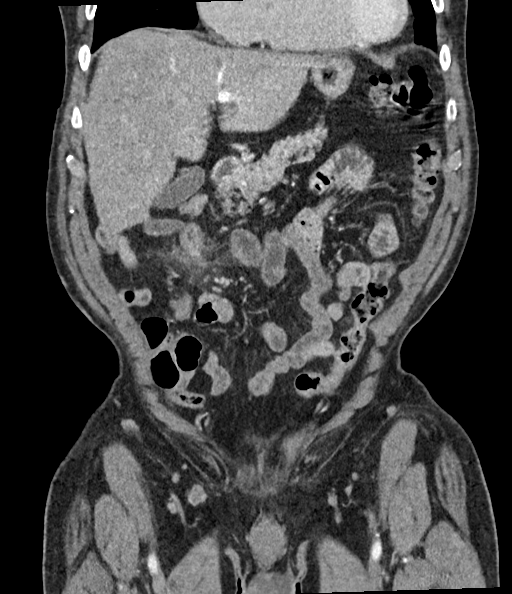
[im 67/151  soft-tissue]
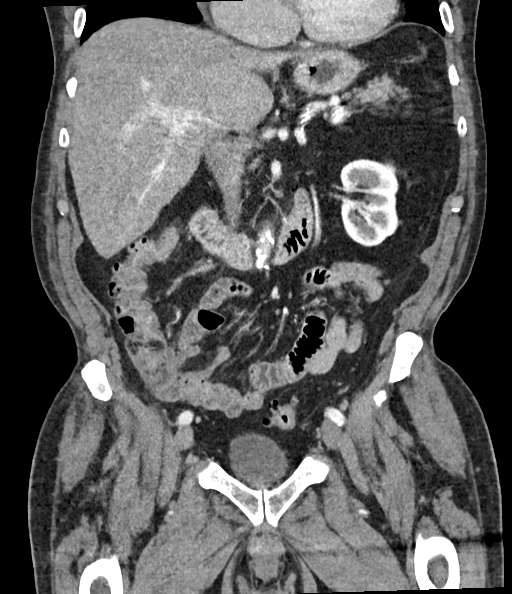
[im 84/151  soft-tissue]
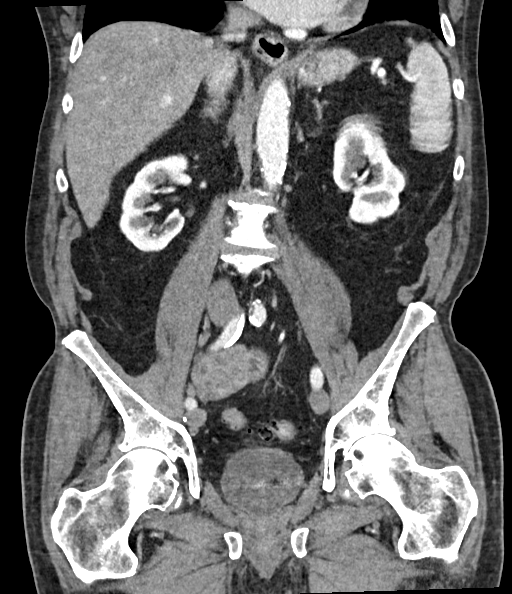

[14 of 46 positions shown; findings below may reference images not displayed]

FINDINGS: Lower chest: No acute abnormality.

Hepatobiliary: Fatty infiltration of the liver is noted. The
gallbladder is within normal limits. Multiple rounded hypodensities
are noted within the liver consistent with small cysts.

Pancreas: Unremarkable. No pancreatic ductal dilatation or
surrounding inflammatory changes.

Spleen: Normal in size without focal abnormality.

Adrenals/Urinary Tract: Adrenal glands are within normal limits.
Kidneys demonstrate a normal enhancement pattern bilaterally. Small
cyst is noted in the lower pole of the right kidney. Normal
excretion is noted on delayed images. The bladder is partially
distended.

Stomach/Bowel: Scattered diverticular changes noted without evidence
of diverticulitis. No obstructive or inflammatory changes are seen.
The appendix is not well visualized. No inflammatory changes to
suggest appendicitis are seen. Stomach is within normal limits.
There is some inflammatory change in the right mid abdomen
surrounding multiple loops of mid to distal small bowel. Some mild
associated adenopathy is noted. This likely represents some
small-bowel diverticulitis no definitive perforation is seen. No
abscess is identified.

Vascular/Lymphatic: Aortic atherosclerosis. No enlarged abdominal or
pelvic lymph nodes.

Reproductive: Prostate has been surgically removed.

Other: No abdominal wall hernia or abnormality. No abdominopelvic
ascites.

Musculoskeletal: No acute or significant osseous findings.
IMPRESSION: Inflammatory changes in the right mid abdomen surrounding a few
loops of small bowel consistent with small bowel diverticulitis.

No other acute abnormality is noted.

Chronic changes as described above.

## 2021-03-11 ENCOUNTER — Other Ambulatory Visit: Payer: Self-pay

## 2021-03-11 MED ORDER — ATORVASTATIN CALCIUM 10 MG PO TABS: 10.0000 mg | ORAL_TABLET | Freq: Every day | ORAL | 3 refills | Status: DC

## 2021-03-25 ENCOUNTER — Other Ambulatory Visit: Payer: Self-pay

## 2021-03-25 MED ORDER — LISINOPRIL 20 MG PO TABS: 20.0000 mg | ORAL_TABLET | Freq: Every day | ORAL | 1 refills | Status: DC

## 2021-03-25 NOTE — Telephone Encounter (Signed)
Pt's medication was sent to pt's pharmacy as requested. Confirmation received.  °

## 2021-04-04 ENCOUNTER — Other Ambulatory Visit: Payer: Self-pay | Admitting: Physician Assistant

## 2021-04-04 ENCOUNTER — Ambulatory Visit
Admission: RE | Admit: 2021-04-04 | Discharge: 2021-04-04 | Disposition: A | Discharge status: Home / Self Care | Payer: PPO | Source: Ambulatory Visit | Attending: Physician Assistant | Admitting: Physician Assistant

## 2021-04-04 DIAGNOSIS — I10 Essential (primary) hypertension: Secondary | ICD-10-CM | POA: Diagnosis not present

## 2021-04-04 DIAGNOSIS — R059 Cough, unspecified: Secondary | ICD-10-CM

## 2021-04-04 DIAGNOSIS — B974 Respiratory syncytial virus as the cause of diseases classified elsewhere: Secondary | ICD-10-CM | POA: Diagnosis not present

## 2021-04-04 DIAGNOSIS — I480 Paroxysmal atrial fibrillation: Secondary | ICD-10-CM | POA: Diagnosis not present

## 2021-04-04 DIAGNOSIS — B338 Other specified viral diseases: Secondary | ICD-10-CM | POA: Diagnosis not present

## 2021-04-04 DIAGNOSIS — D6869 Other thrombophilia: Secondary | ICD-10-CM | POA: Diagnosis not present

## 2021-04-04 DIAGNOSIS — Z03818 Encounter for observation for suspected exposure to other biological agents ruled out: Secondary | ICD-10-CM | POA: Diagnosis not present

## 2021-04-23 DIAGNOSIS — B356 Tinea cruris: Secondary | ICD-10-CM | POA: Diagnosis not present

## 2021-04-23 DIAGNOSIS — D1801 Hemangioma of skin and subcutaneous tissue: Secondary | ICD-10-CM | POA: Diagnosis not present

## 2021-04-23 DIAGNOSIS — L720 Epidermal cyst: Secondary | ICD-10-CM | POA: Diagnosis not present

## 2021-04-23 DIAGNOSIS — L821 Other seborrheic keratosis: Secondary | ICD-10-CM | POA: Diagnosis not present

## 2021-04-23 DIAGNOSIS — L281 Prurigo nodularis: Secondary | ICD-10-CM | POA: Diagnosis not present

## 2021-04-23 DIAGNOSIS — L570 Actinic keratosis: Secondary | ICD-10-CM | POA: Diagnosis not present

## 2021-04-23 DIAGNOSIS — Z85828 Personal history of other malignant neoplasm of skin: Secondary | ICD-10-CM | POA: Diagnosis not present

## 2021-05-02 DIAGNOSIS — H903 Sensorineural hearing loss, bilateral: Secondary | ICD-10-CM | POA: Diagnosis not present

## 2021-05-09 DIAGNOSIS — H5212 Myopia, left eye: Secondary | ICD-10-CM | POA: Diagnosis not present

## 2021-05-09 DIAGNOSIS — H25013 Cortical age-related cataract, bilateral: Secondary | ICD-10-CM | POA: Diagnosis not present

## 2021-05-09 DIAGNOSIS — H2513 Age-related nuclear cataract, bilateral: Secondary | ICD-10-CM | POA: Diagnosis not present

## 2021-05-09 DIAGNOSIS — H52201 Unspecified astigmatism, right eye: Secondary | ICD-10-CM | POA: Diagnosis not present

## 2021-05-09 DIAGNOSIS — H524 Presbyopia: Secondary | ICD-10-CM | POA: Diagnosis not present

## 2021-05-21 DIAGNOSIS — H903 Sensorineural hearing loss, bilateral: Secondary | ICD-10-CM | POA: Diagnosis not present

## 2021-06-12 DIAGNOSIS — Z0000 Encounter for general adult medical examination without abnormal findings: Secondary | ICD-10-CM | POA: Diagnosis not present

## 2021-06-12 DIAGNOSIS — I4891 Unspecified atrial fibrillation: Secondary | ICD-10-CM | POA: Diagnosis not present

## 2021-06-12 DIAGNOSIS — Z8546 Personal history of malignant neoplasm of prostate: Secondary | ICD-10-CM | POA: Diagnosis not present

## 2021-06-12 DIAGNOSIS — M7500 Adhesive capsulitis of unspecified shoulder: Secondary | ICD-10-CM | POA: Diagnosis not present

## 2021-06-12 DIAGNOSIS — I10 Essential (primary) hypertension: Secondary | ICD-10-CM | POA: Diagnosis not present

## 2021-06-12 DIAGNOSIS — J309 Allergic rhinitis, unspecified: Secondary | ICD-10-CM | POA: Diagnosis not present

## 2021-06-12 DIAGNOSIS — E7800 Pure hypercholesterolemia, unspecified: Secondary | ICD-10-CM | POA: Diagnosis not present

## 2021-07-01 ENCOUNTER — Other Ambulatory Visit: Payer: Self-pay

## 2021-07-01 MED ORDER — APIXABAN 5 MG PO TABS: 5.0000 mg | ORAL_TABLET | Freq: Two times a day (BID) | ORAL | 1 refills | Status: DC

## 2021-07-01 NOTE — Telephone Encounter (Signed)
Pt last saw Dr Lovena Le 08/02/20, last labs 06/12/21 Creat 1.28 at [place], age 81, weight 84.4kg, based on specified criteria pt is on appropriate dosage of Eliquis '5mg'$  BID for afib.  Will refill rx.  ?

## 2021-09-02 ENCOUNTER — Other Ambulatory Visit: Payer: Self-pay

## 2021-09-02 MED ORDER — HYDROCHLOROTHIAZIDE 25 MG PO TABS: 25.0000 mg | ORAL_TABLET | Freq: Every day | ORAL | 0 refills | Status: DC

## 2021-09-17 ENCOUNTER — Other Ambulatory Visit: Payer: Self-pay | Admitting: *Deleted

## 2021-09-17 MED ORDER — LISINOPRIL 20 MG PO TABS: 20.0000 mg | ORAL_TABLET | Freq: Every day | ORAL | 0 refills | Status: DC

## 2021-10-09 ENCOUNTER — Other Ambulatory Visit: Payer: Self-pay

## 2021-10-09 MED ORDER — HYDROCHLOROTHIAZIDE 25 MG PO TABS: 25.0000 mg | ORAL_TABLET | Freq: Every day | ORAL | 0 refills | Status: DC

## 2021-10-21 DIAGNOSIS — M19011 Primary osteoarthritis, right shoulder: Secondary | ICD-10-CM | POA: Diagnosis not present

## 2021-10-21 DIAGNOSIS — M19012 Primary osteoarthritis, left shoulder: Secondary | ICD-10-CM | POA: Diagnosis not present

## 2021-10-23 ENCOUNTER — Ambulatory Visit (INDEPENDENT_AMBULATORY_CARE_PROVIDER_SITE_OTHER): Payer: PPO | Admitting: Podiatry

## 2021-10-23 DIAGNOSIS — B351 Tinea unguium: Secondary | ICD-10-CM

## 2021-10-23 DIAGNOSIS — M79675 Pain in left toe(s): Secondary | ICD-10-CM | POA: Diagnosis not present

## 2021-10-23 DIAGNOSIS — M79674 Pain in right toe(s): Secondary | ICD-10-CM

## 2021-10-24 ENCOUNTER — Telehealth: Payer: Self-pay | Admitting: Internal Medicine

## 2021-10-24 ENCOUNTER — Other Ambulatory Visit: Payer: Self-pay

## 2021-10-24 MED ORDER — LISINOPRIL 20 MG PO TABS: 20.0000 mg | ORAL_TABLET | Freq: Every day | ORAL | 0 refills | Status: DC

## 2021-10-24 MED ORDER — HYDROCHLOROTHIAZIDE 25 MG PO TABS: 25.0000 mg | ORAL_TABLET | Freq: Every day | ORAL | 1 refills | Status: DC

## 2021-10-24 MED ORDER — LISINOPRIL 20 MG PO TABS: 20.0000 mg | ORAL_TABLET | Freq: Every day | ORAL | 1 refills | Status: DC

## 2021-10-24 MED ORDER — HYDROCHLOROTHIAZIDE 25 MG PO TABS: 25.0000 mg | ORAL_TABLET | Freq: Every day | ORAL | 0 refills | Status: DC

## 2021-10-24 NOTE — Progress Notes (Signed)
  Subjective:  Patient ID: Glenn [name], male    DOB: 06-22-1940,  MRN: [id number]  Chief Complaint  Patient presents with   Nail Problem    Routine foot care   81 y.o. male returns for the above complaint.  Patient presents with thickened elongated dystrophic toenails x10.  Patient states painful to the patient diabetic he would like to have the nails debrided down he denies any other acute complaints.  Objective:  There were no vitals filed for this visit. Podiatric Exam: Vascular: dorsalis pedis and posterior tibial pulses are palpable bilateral. Capillary return is immediate. Temperature gradient is WNL. Skin turgor WNL  Sensorium: Normal Semmes Weinstein monofilament test. Normal tactile sensation bilaterally. Nail Exam: Pt has thick disfigured discolored nails with subungual debris noted bilateral entire nail hallux through fifth toenails.  Pain on palpation to the nails. Ulcer Exam: There is no evidence of ulcer or pre-ulcerative changes or infection. Orthopedic Exam: Muscle tone and strength are WNL. No limitations in general ROM. No crepitus or effusions noted.  Skin: No Porokeratosis. No infection or ulcers    Assessment & Plan:  No diagnosis found.  Patient was evaluated and treated and all questions answered.  Onychomycosis with pain  -Nails palliatively debrided as below. -Educated on self-care  Procedure: Nail Debridement Rationale: pain  Type of Debridement: manual, sharp debridement. Instrumentation: Nail nipper, rotary burr. Number of Nails: 10  Procedures and Treatment: Consent by patient was obtained for treatment procedures. The patient understood the discussion of treatment and procedures well. All questions were answered thoroughly reviewed. Debridement of mycotic and hypertrophic toenails, 1 through 5 bilateral and clearing of subungual debris. No ulceration, no infection noted.  Return Visit-Office Procedure: Patient instructed to return to the office for a  follow up visit 3 months for continued evaluation and treatment.  Boneta Lucks, DPM    No follow-ups on file.

## 2021-10-24 NOTE — Telephone Encounter (Signed)
*  STAT* If patient is at the pharmacy, call can be transferred to refill team.   1. Which medications need to be refilled? (please list name of each medication and dose if known) Lisinopril and Hydrochlorothiazide  2. Which pharmacy/location (including street and city if local pharmacy) is medication to be sent to? [facility] [address], [place]  3. Do they need a 30 day or 90 day supply? Enough until 12-09-21 first availableappointment

## 2021-10-24 NOTE — Telephone Encounter (Signed)
Pt's medications were sent to pt's pharmacy as requested. Confirmation received.  

## 2021-12-07 NOTE — Progress Notes (Unsigned)
Cardiology Office Note Date:  12/07/2021  Patient ID:  Glenn [name], DOB Aug 17, 1940, MRN [id number] PCP:  Aurea Graff.Marlou Sa, MD  Cardiologist:  Dr. Lovena Le    Chief Complaint: *** annual visit  History of Present Illness: Glenn [name] is a 81 y.o. male with history of HTN, prostate cancer (s/p prostatectomy), and permanent AFib on a/c.  He saw Dr. Lovena Le Nov 2020.  He was doing well, rate controlled.  Mentioned edema resolved off amlodipine, noted conjunctival bleeding, no plans to stop his OAC,  Monitor BP off amlodipine.     I saw him 07/26/2019 He is pending R total knee arthroplasty requiring clearance.  Pharmacy has completed completed eliquis recommendations (off 3 days). Mentions he was all ready and cleared for his knee surgery last March and cancelled 2/2 COVID.  He says his knee is weak not so much painful and for the first time when playing tennis a week ago or so gave out on him.  He is still playing tennis twice a week and golfing regularly.  He denies any exertional incapacities.   NO CP, no palpitations, no cardiac awareness.  NO rest SOB, no DOE, no symptoms or PND or orthopnea. No dizzy spells, near syncope or syncope. He bled quite a bit after a tooth extraction last year, otherwise no bleeding or signs of bleeidng, tnds to bruise easily. RCRI: score is zero, risk 0.4% DUKE:  8.97METS Felt to be an acceptable cardiac risk for his procedure/surgery planned Recommended continued annual EP visits.  He saw Dr. Lovena Le 08/02/20, off amlodipine with resolved edema, HR and BP looked good, discussed an episode of conjunctival bleedig, not recommended to stop [name].  *** symptoms *** HR *** bleeding, eliquis, dose  Afib Hx Diagnosed incidentally Feb 2012 permanent No AAD hx  Past Medical History:  Diagnosis Date   Atrial fibrillation (HCC)    takes aspirin for this   Brachial plexus injury 1995   Left arm   Dysrhythmia 2009   A-fib   HTN (hypertension)    6 years   Injury  of brachial plexus    Prostate cancer [facility])    Prostate cancer ([place])    Torn rotator cuff    Torn rotator cuff 2000   Right arm    Past Surgical History:  Procedure Laterality Date   ETT  2006   High fitness but bp of 255   Int fixation left forearm  01/19/1989   KNEE SURGERY     left   PROSTATECTOMY     removal of basal cell     TONSILLECTOMY     TOTAL KNEE ARTHROPLASTY Right 08/09/2019   Procedure: TOTAL KNEE ARTHROPLASTY;  Surgeon: Paralee Cancel, MD;  Location: [facility];  Service: Orthopedics;  Laterality: Right;  70 mins    Current Outpatient Medications  Medication Sig Dispense Refill   amoxicillin (AMOXIL) 500 MG capsule Take 2,000 mg by mouth See admin instructions. Take 4 capsules (2000 mg) by mouth 1 hour prior to dental procedures.     apixaban (ELIQUIS) 5 MG TABS tablet Take 1 tablet (5 mg total) by mouth 2 (two) times daily. DUE for follow-up, MUST see provider for FUTURE refills. Please call office [phone] to schedule appt. 180 tablet 1   atorvastatin (LIPITOR) 10 MG tablet Take 1 tablet (10 mg total) by mouth daily. 90 tablet 3   hydrochlorothiazide (HYDRODIURIL) 25 MG tablet Take 1 tablet (25 mg total) by mouth daily. 30 tablet 1   lisinopril (ZESTRIL)  20 MG tablet Take 1 tablet (20 mg total) by mouth daily. 30 tablet 1   tetrahydrozoline 0.05 % ophthalmic solution Place 1-2 drops into both eyes 3 (three) times daily as needed (dry/irritated eyes.).     No current facility-administered medications for this visit.    Allergies:   Patient has no known allergies.   Social History:  The patient  reports that he quit smoking about 36 years ago. He has a 7.50 pack-year smoking history. He has never used smokeless tobacco. He reports current alcohol use. He reports that he does not use drugs.   Family History:  The patient's family history includes Heart attack in his father; Obesity in his brother; Parkinsonism in his brother; Prostate cancer in his brother.  ROS:   Please see the history of present illness.  All other systems are reviewed and otherwise negative.   PHYSICAL EXAM:  VS:  There were no vitals taken for this visit. BMI: There is no height or weight on file to calculate BMI. Well nourished, well developed, in no acute distress  HEENT: normocephalic, atraumatic  Neck: no JVD, carotid bruits or masses Cardiac:  *** irreg-irreg; no significant murmurs, no rubs, or gallops Lungs:   *** CTA b/l, no wheezing, rhonchi or rales  Abd: soft, nontender MS: no deformity or atrophy Ext: *** trace edema b/l Skin: warm and dry, no rash Neuro:  No gross deficits appreciated Psych: euthymic mood, full affect    EKG:  Done today and reviewed by myself shows  ***   04/02/2016: stress myoview Nuclear stress EF: 62%. Blood pressure demonstrated a normal response to exercise. There was no ST segment deviation noted during stress. Findings consistent with ischemia. This is a low risk study Small area of mild apical ischemia EF 62%    06/25/2011: LHC  Final Conclusions:   1. No significant coronary artery disease 2. Normal left ventricular systolic function   Recent Labs: No results found for requested labs within last 365 days.  No results found for requested labs within last 365 days.   CrCl cannot be calculated (Patient's most recent lab result is older than the maximum 21 days allowed.).   Wt Readings from Last 3 Encounters:  08/02/20 186 lb (84.4 kg)  02/07/20 190 lb (86.2 kg)  01/12/20 190 lb (86.2 kg)     Other studies reviewed: Additional studies/records reviewed today include: summarized above  ASSESSMENT AND PLAN:  1. Permanent Afib     CHA2DS2Vasc is 3, on Eliquis, *** appropriately dosed     *** Asymptomatic and rate controlled      2. HTN     ***      Disposition: ***   Current medicines are reviewed at length with the patient today.  The patient did not have any concerns regarding medicines.  Glenn Night, PA-C 12/07/2021 11:14 AM     [facility] [address] [place] [address] [phone] (office)  [phone] (fax)

## 2021-12-09 ENCOUNTER — Ambulatory Visit: Payer: PPO | Admitting: Physician Assistant

## 2021-12-09 ENCOUNTER — Encounter: Payer: Self-pay | Admitting: Physician Assistant

## 2021-12-09 VITALS — BP 148/86 | HR 66 | Ht 70.0 in | Wt 191.8 lb

## 2021-12-09 DIAGNOSIS — I10 Essential (primary) hypertension: Secondary | ICD-10-CM

## 2021-12-09 DIAGNOSIS — I4821 Permanent atrial fibrillation: Secondary | ICD-10-CM | POA: Diagnosis not present

## 2021-12-09 MED ORDER — LISINOPRIL 20 MG PO TABS: 20.0000 mg | ORAL_TABLET | Freq: Every day | ORAL | 3 refills | Status: DC

## 2021-12-09 MED ORDER — APIXABAN 5 MG PO TABS: 5.0000 mg | ORAL_TABLET | Freq: Two times a day (BID) | ORAL | 3 refills | Status: DC

## 2021-12-09 MED ORDER — ATORVASTATIN CALCIUM 10 MG PO TABS: 10.0000 mg | ORAL_TABLET | Freq: Every day | ORAL | 3 refills | Status: DC

## 2021-12-09 MED ORDER — HYDROCHLOROTHIAZIDE 25 MG PO TABS: 25.0000 mg | ORAL_TABLET | Freq: Every day | ORAL | 3 refills | Status: DC

## 2021-12-09 NOTE — Patient Instructions (Signed)
Medication Instructions:  No changes *If you need a refill on your cardiac medications before your next appointment, please call your pharmacy*   Lab Work: none If you have labs (blood work) drawn today and your tests are completely normal, you will receive your results only by: [place] (if you have MyChart) OR A paper copy in the mail If you have any lab test that is abnormal or we need to change your treatment, we will call you to review the results.   Testing/Procedures: none   Follow-Up: At [facility], you and your health needs are our priority.  As part of our continuing mission to provide you with exceptional heart care, we have created designated Provider Care Teams.  These Care Teams include your primary Cardiologist (physician) and Advanced Practice Providers (APPs -  Physician Assistants and Nurse Practitioners) who all work together to provide you with the care you need, when you need it.    Your next appointment:   12 month(s)  The format for your next appointment:   In Person  Provider:   Cristopher Peru, MD     Important Information About Sugar

## 2021-12-18 ENCOUNTER — Telehealth: Payer: Self-pay | Admitting: Internal Medicine

## 2021-12-18 DIAGNOSIS — U071 COVID-19: Secondary | ICD-10-CM | POA: Diagnosis not present

## 2021-12-18 NOTE — Telephone Encounter (Signed)
Patient wanted to know if it is ok to take Paxlovid with his current medications. Reviewed with Dr Lynne Leader) and pharmacist. The recommendation is Patient should take 1/2 the dose of his Eliquis during Paxlovid treatment.  He should hold atorvastatin while taking Paxlovid. Reviewed recommendations with patient and he verbalized understanding.

## 2021-12-18 NOTE — Telephone Encounter (Signed)
Patient called to talk with Dr. Lovena Le or nurse

## 2021-12-18 NOTE — Telephone Encounter (Signed)
Patient should take 1/2 the dose of his Eliquis during Paxlovid treatment.  He should hold atorvastatin while taking Paxlovid.

## 2022-01-20 DIAGNOSIS — M19012 Primary osteoarthritis, left shoulder: Secondary | ICD-10-CM | POA: Diagnosis not present

## 2022-01-20 DIAGNOSIS — M19011 Primary osteoarthritis, right shoulder: Secondary | ICD-10-CM | POA: Diagnosis not present

## 2022-03-19 DIAGNOSIS — E7800 Pure hypercholesterolemia, unspecified: Secondary | ICD-10-CM | POA: Diagnosis not present

## 2022-03-19 DIAGNOSIS — Z23 Encounter for immunization: Secondary | ICD-10-CM | POA: Diagnosis not present

## 2022-03-19 DIAGNOSIS — R208 Other disturbances of skin sensation: Secondary | ICD-10-CM | POA: Diagnosis not present

## 2022-03-19 DIAGNOSIS — M170 Bilateral primary osteoarthritis of knee: Secondary | ICD-10-CM | POA: Diagnosis not present

## 2022-03-19 DIAGNOSIS — I4891 Unspecified atrial fibrillation: Secondary | ICD-10-CM | POA: Diagnosis not present

## 2022-03-19 DIAGNOSIS — M7500 Adhesive capsulitis of unspecified shoulder: Secondary | ICD-10-CM | POA: Diagnosis not present

## 2022-03-19 DIAGNOSIS — I10 Essential (primary) hypertension: Secondary | ICD-10-CM | POA: Diagnosis not present

## 2022-04-24 ENCOUNTER — Ambulatory Visit: Payer: PPO | Attending: Physician Assistant | Admitting: Physician Assistant

## 2022-04-24 ENCOUNTER — Telehealth: Payer: Self-pay | Admitting: Internal Medicine

## 2022-04-24 VITALS — BP 176/98

## 2022-04-24 DIAGNOSIS — I4821 Permanent atrial fibrillation: Secondary | ICD-10-CM

## 2022-04-24 DIAGNOSIS — D6869 Other thrombophilia: Secondary | ICD-10-CM | POA: Diagnosis not present

## 2022-04-24 DIAGNOSIS — I10 Essential (primary) hypertension: Secondary | ICD-10-CM

## 2022-04-24 MED ORDER — LISINOPRIL 20 MG PO TABS: 30.0000 mg | ORAL_TABLET | Freq: Every day | ORAL | 3 refills | Status: DC

## 2022-04-24 NOTE — Telephone Encounter (Signed)
Pt c/o BP issue: STAT if pt c/o blurred vision, one-sided weakness or slurred speech  1. What are your last 5 BP readings? 175/105  2. Are you having any other symptoms (ex. Dizziness, headache, blurred vision, passed out)? Sob   3. What is your BP issue? Too high

## 2022-04-24 NOTE — Telephone Encounter (Signed)
Pt called back per message received.    Pt stated his blood pressure has been unusually high.  Pt states has had AFIB for 10 years.  Pt takes HCTZ, but BP has been running 175/105 and 184/104.  Pt takes his blood pressure medication regularly, and was very concerned about the elevated readings.  Pt states his HR this am was 57.  Pt states that also has intermittent fatigue.   Pt asked for an appointment with Ms. Tommye Standard, PA-C;  Pt remembered seeing her, and wanted to know if she could help him better manage his BP.  Pt saw Ms. Charlcie Cradle, PA-C back in 11/2021.   Per Ms. Olevia Bowens, she had an appointment opening at 1120 am on 04/24/22, and agreed to assess his BP. Pt advised of this, and asked to bring his home BP cuff, and stated he would.

## 2022-04-24 NOTE — Patient Instructions (Signed)
Medication Instructions:    TAKE : LISINOPRIL  30 MG ONCE  A DAY UNTIL SUNDAY IF BLOOD PRESSURE NOT BETTER GOT TO 40 MG ONCE A DAY .  *If you need a refill on your cardiac medications before your next appointment, please call your pharmacy*   Lab Work: [place]    If you have labs (blood work) drawn today and your tests are completely normal, you will receive your results only by: [place] (if you have MyChart) OR A paper copy in the mail If you have any lab test that is abnormal or we need to change your treatment, we will call you to review the results.   Testing/Procedures: Your physician has requested that you have an echocardiogram. Echocardiography is a painless test that uses sound waves to create images of your heart. It provides your doctor with information about the size and shape of your heart and how well your heart's chambers and valves are working. This procedure takes approximately one hour. There are no restrictions for this procedure. Please do NOT wear cologne, perfume, aftershave, or lotions (deodorant is allowed). Please arrive 15 minutes prior to your appointment time.     Follow-Up: At [facility], you and your health needs are our priority.  As part of our continuing mission to provide you with exceptional heart care, we have created designated Provider Care Teams.  These Care Teams include your primary Cardiologist (physician) and Advanced Practice Providers (APPs -  Physician Assistants and Nurse Practitioners) who all work together to provide you with the care you need, when you need it.  We recommend signing up for the patient portal called "MyChart".  Sign up information is provided on this After Visit Summary.  MyChart is used to connect with patients for Virtual Visits (Telemedicine).  Patients are able to view lab/test results, encounter notes, upcoming appointments, etc.  Non-urgent messages can be sent to your provider as well.    To learn more about what you can do with MyChart, go to [email or web address].    Your next appointment:   6 -8 week(s)  The format for your next appointment:   In Person  Provider:    or one of the following Advanced Practice Providers on your designated Care Team:   Tommye Standard, [place]-  Other Instructions   Important Information About Sugar

## 2022-04-24 NOTE — Progress Notes (Addendum)
Cardiology Office Note Date:  04/24/2022  Patient ID:  Glenn [name], DOB [birth year], MRN [id number] PCP:  Aurea Graff.Marlou Sa, MD  Cardiologist:  Dr. Lovena Le    Chief Complaint:  high bp  History of Present Illness: Glenn [name] is a 82 y.o. male with history of HTN, prostate cancer (s/p prostatectomy), and permanent AFib on a/c.  He saw Dr. Lovena Le Nov 2020.  He was doing well, rate controlled.  Mentioned edema resolved off amlodipine, noted conjunctival bleeding, no plans to stop his OAC,  Monitor BP off amlodipine.     I saw him 07/26/2019 He is pending R total knee arthroplasty requiring clearance.  Pharmacy has completed completed eliquis recommendations (off 3 days). Mentions he was all ready and cleared for his knee surgery last March and cancelled 2/2 COVID.  He says his knee is weak not so much painful and for the first time when playing tennis a week ago or so gave out on him.  He is still playing tennis twice a week and golfing regularly.  He denies any exertional incapacities.   NO CP, no palpitations, no cardiac awareness.  NO rest SOB, no DOE, no symptoms or PND or orthopnea. No dizzy spells, near syncope or syncope. He bled quite a bit after a tooth extraction last year, otherwise no bleeding or signs of bleeidng, tnds to bruise easily. RCRI: score is zero, risk 0.4% DUKE:  8.97METS Felt to be an acceptable cardiac risk for his procedure/surgery planned Recommended continued annual EP visits.  He saw Dr. Lovena Le 08/02/20, off amlodipine with resolved edema, HR and BP looked good, discussed an episode of conjunctival bleedig, not recommended to stop [place].  I saw him 12/09/21 He continues to do well Still playing tennis, very active, good exertional capacity. Rarely has a fleeting vague awareness of his heart beat. No CP No SOB No near syncope or syncope. Bleeds easily with trauma, none otherwise Was seeinghis PMD soon to f/u on his BP No changes were made and planned to see him in  a year  He called today requesting a visit with elevated BPs at home He feels well Noted yesterday after getting back from golfing that he felt a little tired and checked his BP, was high 170's/100 or so. Took it again today and again was elevated. He does not heck his BP regularly, had a PMD physical about 2 weeks ago and recalls his BP being fine then. His labs reported as OK as well, his BUN "one point higher then normal" everything else was wnl.  No CP, palpitations He will get a little winded when going up/down the stairs a lot, like getting the Christmas decorations out. Otherwise not real exertional intolerances. No near syncope or syncope No bleeding or signs of bleeding  He probabaly has been indulging more then usual with his diet over the holidays. Socks are leaving some marks when he takes them off at night the last week or so.  Afib Hx Diagnosed incidentally Feb 2012 permanent No AAD hx  Past Medical History:  Diagnosis Date   Atrial fibrillation (HCC)    takes aspirin for this   Brachial plexus injury 1995   Left arm   Dysrhythmia 2009   A-fib   HTN (hypertension)    6 years   Injury of brachial plexus    Prostate cancer (HCC)    Prostate cancer ([place])    Torn rotator cuff    Torn rotator cuff 2000   Right arm  Past Surgical History:  Procedure Laterality Date   ETT  2006   High fitness but bp of 255   Int fixation left forearm  01/19/1989   KNEE SURGERY     left   PROSTATECTOMY     removal of basal cell     TONSILLECTOMY     TOTAL KNEE ARTHROPLASTY Right 08/09/2019   Procedure: TOTAL KNEE ARTHROPLASTY;  Surgeon: Paralee Cancel, MD;  Location: [facility];  Service: Orthopedics;  Laterality: Right;  70 mins    Current Outpatient Medications  Medication Sig Dispense Refill   amoxicillin (AMOXIL) 500 MG capsule Take 2,000 mg by mouth See admin instructions. Take 4 capsules (2000 mg) by mouth 1 hour prior to dental procedures.     apixaban (ELIQUIS) 5 MG  TABS tablet Take 1 tablet (5 mg total) by mouth 2 (two) times daily. 180 tablet 3   atorvastatin (LIPITOR) 10 MG tablet Take 1 tablet (10 mg total) by mouth daily. 90 tablet 3   hydrochlorothiazide (HYDRODIURIL) 25 MG tablet Take 1 tablet (25 mg total) by mouth daily. 90 tablet 3   lisinopril (ZESTRIL) 20 MG tablet Take 1 tablet (20 mg total) by mouth daily. 90 tablet 3   tetrahydrozoline 0.05 % ophthalmic solution Place 1-2 drops into both eyes 3 (three) times daily as needed (dry/irritated eyes.).     No current facility-administered medications for this visit.    Allergies:   Patient has no known allergies.   Social History:  The patient  reports that he quit smoking about 37 years ago. His smoking use included cigarettes. He has a 7.50 pack-year smoking history. He has never used smokeless tobacco. He reports current alcohol use. He reports that he does not use drugs.   Family History:  The patient's family history includes Heart attack in his father; Obesity in his brother; Parkinsonism in his brother; Prostate cancer in his brother.  ROS:  Please see the history of present illness.  All other systems are reviewed and otherwise negative.   PHYSICAL EXAM:  VS:  There were no vitals taken for this visit. BMI: There is no height or weight on file to calculate BMI. Well nourished, well developed, in no acute distress  HEENT: normocephalic, atraumatic  Neck: no JVD, carotid bruits or masses Cardiac:  irreg-irreg; no significant murmurs, no rubs, or gallops Lungs:   CTA b/l, no wheezing, rhonchi or rales  Abd: soft, nontender MS: no deformity or atrophy Ext: trace-1+ edema to about mid-shin b/l Skin: warm and dry, no rash Neuro:  No gross deficits appreciated Psych: euthymic mood, full affect    EKG:  not done today   04/02/2016: stress myoview Nuclear stress EF: 62%. Blood pressure demonstrated a normal response to exercise. There was no ST segment deviation noted during  stress. Findings consistent with ischemia. This is a low risk study Small area of mild apical ischemia EF 62%    06/25/2011: LHC  Final Conclusions:   1. No significant coronary artery disease 2. Normal left ventricular systolic function   Recent Labs: No results found for requested labs within last 365 days.  No results found for requested labs within last 365 days.   CrCl cannot be calculated (Patient's most recent lab result is older than the maximum 21 days allowed.).   Wt Readings from Last 3 Encounters:  12/09/21 191 lb 12.8 oz (87 kg)  08/02/20 186 lb (84.4 kg)  02/07/20 190 lb (86.2 kg)     Other studies reviewed:  Additional studies/records reviewed today include: summarized above  ASSESSMENT AND PLAN:  1. Permanent Afib 2.  Secondary hypercoagulable state      CHA2DS2Vasc is 3, on Eliquis, appropriately dosed by last available labs     Asymptomatic and rate controlled  Avoid nodal blocking agents with HRs 50's-60s'      3. HTN     Suspect unusually elevated with some dietary indiscretions over the holidays, with reports of good BP a few weeks ago. His home cuff was accurate here Repeat BP by myself 176/98 I have asked that he reduce sodium in his diet Will have him take '30mg'$  lisinopril the next couple days and if not improving to go to '40mg'$  daily (2 tablets)  He will let us know Monday how his BP is Will update his echo Request his labs from his PMD office Might consider changing his diuretic   Disposition: Back in 6 weeks or so, sooner if needed.   Current medicines are reviewed at length with the patient today.  The patient did not have any concerns regarding medicines.  Venetia Night, PA-C 04/24/2022 10:51 AM     [place] [address] [phone] (office)  [phone] (fax)

## 2022-04-25 ENCOUNTER — Ambulatory Visit: Payer: PPO | Admitting: Podiatry

## 2022-04-29 ENCOUNTER — Ambulatory Visit: Payer: PPO | Admitting: Nurse Practitioner

## 2022-04-29 ENCOUNTER — Ambulatory Visit: Payer: PPO | Attending: Physician Assistant

## 2022-04-29 ENCOUNTER — Telehealth: Payer: Self-pay | Admitting: Internal Medicine

## 2022-04-29 DIAGNOSIS — R001 Bradycardia, unspecified: Secondary | ICD-10-CM

## 2022-04-29 DIAGNOSIS — I10 Essential (primary) hypertension: Secondary | ICD-10-CM

## 2022-04-29 MED ORDER — LISINOPRIL 40 MG PO TABS: 40.0000 mg | ORAL_TABLET | Freq: Every day | ORAL | 3 refills | Status: DC

## 2022-04-29 MED ORDER — AMLODIPINE BESYLATE 2.5 MG PO TABS: 2.5000 mg | ORAL_TABLET | Freq: Every day | ORAL | 3 refills | Status: DC

## 2022-04-29 NOTE — Progress Notes (Unsigned)
Enrolled patient for a 3 day Zio XT monitor to be mailed to patients home  

## 2022-04-29 NOTE — Telephone Encounter (Signed)
Called patient back to let him know Tommye Standard PA wants to start him on amlodipine 2.5 mg by mouth daily and she ordered a 3 day monitor. Orders have been placed.

## 2022-04-29 NOTE — Telephone Encounter (Signed)
Pt c/o BP issue: STAT if pt c/o blurred vision, one-sided weakness or slurred speech  1. What are your last 5 BP readings? Friday am 148/105, pm 143/106, sat am 137/102, pm 141/104, Sunday increased his lisinopril to 40 mg and then took BP in the morning 135/104, 139/106 pm, Monday am 142/106, pm 181/102, This morning 177/104 HR 44  2. Are you having any other symptoms (ex. Dizziness, headache, blurred vision, passed out)? No symptoms  3. What is your BP issue? Patient says his BP has not been going down even though his lisinopril was increased. He says he has also noticed his HR has been in the 40's. He says he does not trust his BP cuff completely.

## 2022-04-29 NOTE — Telephone Encounter (Signed)
Call sent straight to triage. Patient calling about his BP being too high after Lisinopril was increased to 30 mg and his HR being in the 40's. Patient stated after a few days with the increase in Lisinopril he increased it to 40 mg for the last 3 days. Patient is on HCTZ as well. Patient stated he is not having any other symptoms, but his BP is still elevated. Will forward to Tommye Standard PA and Dr. Lovena Le for advisement.

## 2022-05-01 ENCOUNTER — Ambulatory Visit (INDEPENDENT_AMBULATORY_CARE_PROVIDER_SITE_OTHER): Payer: PPO | Admitting: Podiatry

## 2022-05-01 VITALS — BP 124/64

## 2022-05-01 DIAGNOSIS — M79674 Pain in right toe(s): Secondary | ICD-10-CM | POA: Diagnosis not present

## 2022-05-01 DIAGNOSIS — B351 Tinea unguium: Secondary | ICD-10-CM | POA: Diagnosis not present

## 2022-05-01 DIAGNOSIS — M79675 Pain in left toe(s): Secondary | ICD-10-CM | POA: Diagnosis not present

## 2022-05-01 NOTE — Progress Notes (Signed)
  Subjective:  Patient ID: Glenn [name], male    DOB: November 13, 1940,  MRN: [id number]  Chief Complaint  Patient presents with   Nail Problem    Nail trim   82 y.o. male returns for the above complaint.  Patient presents with thickened elongated dystrophic toenails x10.  Patient states painful to the patient diabetic he would like to have the nails debrided down he denies any other acute complaints.  Objective:   Vitals:   05/01/22 0810  BP: 124/64   Podiatric Exam: Vascular: dorsalis pedis and posterior tibial pulses are palpable bilateral. Capillary return is immediate. Temperature gradient is WNL. Skin turgor WNL  Sensorium: Normal Semmes Weinstein monofilament test. Normal tactile sensation bilaterally. Nail Exam: Pt has thick disfigured discolored nails with subungual debris noted bilateral entire nail hallux through fifth toenails.  Pain on palpation to the nails. Ulcer Exam: There is no evidence of ulcer or pre-ulcerative changes or infection. Orthopedic Exam: Muscle tone and strength are WNL. No limitations in general ROM. No crepitus or effusions noted.  Skin: No Porokeratosis. No infection or ulcers    Assessment & Plan:  No diagnosis found.  Patient was evaluated and treated and all questions answered.  Onychomycosis with pain  -Nails palliatively debrided as below. -Educated on self-care  Procedure: Nail Debridement Rationale: pain  Type of Debridement: manual, sharp debridement. Instrumentation: Nail nipper, rotary burr. Number of Nails: 10  Procedures and Treatment: Consent by patient was obtained for treatment procedures. The patient understood the discussion of treatment and procedures well. All questions were answered thoroughly reviewed. Debridement of mycotic and hypertrophic toenails, 1 through 5 bilateral and clearing of subungual debris. No ulceration, no infection noted.  Return Visit-Office Procedure: Patient instructed to return to the office for a follow  up visit 3 months for continued evaluation and treatment.  Boneta Lucks, DPM    No follow-ups on file.

## 2022-05-02 DIAGNOSIS — I10 Essential (primary) hypertension: Secondary | ICD-10-CM | POA: Diagnosis not present

## 2022-05-02 DIAGNOSIS — R001 Bradycardia, unspecified: Secondary | ICD-10-CM | POA: Diagnosis not present

## 2022-05-07 DIAGNOSIS — G6289 Other specified polyneuropathies: Secondary | ICD-10-CM | POA: Diagnosis not present

## 2022-05-07 DIAGNOSIS — R400 Somnolence: Secondary | ICD-10-CM | POA: Diagnosis not present

## 2022-05-07 DIAGNOSIS — I4821 Permanent atrial fibrillation: Secondary | ICD-10-CM | POA: Diagnosis not present

## 2022-05-07 DIAGNOSIS — D6869 Other thrombophilia: Secondary | ICD-10-CM | POA: Diagnosis not present

## 2022-05-07 DIAGNOSIS — R7301 Impaired fasting glucose: Secondary | ICD-10-CM | POA: Diagnosis not present

## 2022-05-07 DIAGNOSIS — M25512 Pain in left shoulder: Secondary | ICD-10-CM | POA: Diagnosis not present

## 2022-05-07 DIAGNOSIS — I10 Essential (primary) hypertension: Secondary | ICD-10-CM | POA: Diagnosis not present

## 2022-05-07 DIAGNOSIS — E785 Hyperlipidemia, unspecified: Secondary | ICD-10-CM | POA: Diagnosis not present

## 2022-05-14 ENCOUNTER — Telehealth: Payer: Self-pay | Admitting: Physician Assistant

## 2022-05-14 DIAGNOSIS — R001 Bradycardia, unspecified: Secondary | ICD-10-CM | POA: Diagnosis not present

## 2022-05-14 DIAGNOSIS — I10 Essential (primary) hypertension: Secondary | ICD-10-CM | POA: Diagnosis not present

## 2022-05-14 NOTE — Telephone Encounter (Signed)
[employer] calling in EOS reports for monitor. Scanned into chart and awaiting MD review/advisement

## 2022-05-14 NOTE — Telephone Encounter (Signed)
Tia - [employer] calling to give zio result

## 2022-05-15 ENCOUNTER — Telehealth: Payer: Self-pay

## 2022-05-15 MED ORDER — AMLODIPINE BESYLATE 2.5 MG PO TABS: 2.5000 mg | ORAL_TABLET | Freq: Two times a day (BID) | ORAL | 3 refills | Status: DC

## 2022-05-15 NOTE — Addendum Note (Signed)
Addended by: Oleta Mouse C on: 05/15/2022 01:29 PM   Modules accepted: Orders

## 2022-05-15 NOTE — Telephone Encounter (Signed)
Message received from [facility] regarding [employer], Pt Final 3 day Zio monitor results.   Pt due for Echo on 1/30, and follow up appointment with Ms. Renee PA-C on 2/27;  Message sent to providers asking if appointment should be reschedule for sooner time, per information obtained on monitor?

## 2022-05-15 NOTE — Telephone Encounter (Signed)
Pt called back per message received from Ms. Renee PA-C, regarding Pt recent 3 Day Zio monitor results.    Pt states that his PCP increased his amlodipine 2.5 mg to ( twice daily) to better manage his BP.  Pt states his systolic pressure is now between 120 to 941, and diastolic BP between 75 - 85.  Pt states he if feeling fine, and feels good having his BP controlled.   Pt was OK with keeping the 06/17/2022 appointment with Ms. Renee PA-C for follow up.   Pt advised if he has new or worsening symptoms, or any concerns, to call [facility] and let us know.   Will advise Dr. Lovena Le on Tuesday.

## 2022-05-15 NOTE — Telephone Encounter (Signed)
-----  Message from [place], [place] sent at 05/15/2022 12:15 PM EST ----- Regarding: RE: 3 Day Zio Report / Follow up orders In my review, the pauses all occurred nocturnal/early morning hours and none with pt associated symptoms. If he is feeling OK, BP is controlled, I don't think we need to move his appointment, if he is not feeling any better or BP still not improved, he can see MD or EP APP sooner.  We know about his AFib.  Tommye Standard, PA-C  ----- Message ----- From: Varney Daily, RN Sent: 05/15/2022   8:42 AM EST To: Evans Lance, MD; Baldwin [place], PA-C Subject: 3 Day Zio Report / Follow up orders            Good morning Dr. Lovena Le, and Ms. Rosezella Rumpf RN sent me a message  / heads up, regarding this patients 3 Day Zio report ordered by Ms. Renee on 04/29/2022.  He had 161 pauses captured, and 100% AFIB burden.   He has an Echo scheduled 1/30, and 6-8 week follow up with Ms. Renee on 06/17/2022.  Should I contact [facility] for sooner appointment?  Your thoughts?  Thank you for your guidance.   Merrilee Seashore

## 2022-05-19 NOTE — Telephone Encounter (Signed)
After consulting Dr. Lovena Le, per his response below; I reviewed. All of his brady/pauses are between 12 and 6 in the morning. Nothing to do at this time. GT   Pt will keep his 06/17/2022 appointment with Ms. Renee PA-C as follow up.   Pt advised in prior note, to contact [facility] with new or worsening symptoms.

## 2022-05-20 ENCOUNTER — Ambulatory Visit (HOSPITAL_COMMUNITY): Payer: PPO | Attending: Physician Assistant

## 2022-05-20 DIAGNOSIS — I10 Essential (primary) hypertension: Secondary | ICD-10-CM | POA: Diagnosis not present

## 2022-05-20 LAB — ECHOCARDIOGRAM COMPLETE
AR max vel: 1.91 cm2
AV Area VTI: 2.16 cm2
AV Area mean vel: 2.03 cm2
AV Mean grad: 6 mmHg
AV Peak grad: 11.9 mmHg
Ao pk vel: 1.73 m/s
S' Lateral: 2.6 cm

## 2022-05-20 NOTE — Telephone Encounter (Signed)
See my 05/15/2022 note / reply from Dr. Lovena Le.

## 2022-06-10 DIAGNOSIS — D0359 Melanoma in situ of other part of trunk: Secondary | ICD-10-CM | POA: Diagnosis not present

## 2022-06-10 DIAGNOSIS — C44319 Basal cell carcinoma of skin of other parts of face: Secondary | ICD-10-CM | POA: Diagnosis not present

## 2022-06-10 DIAGNOSIS — I872 Venous insufficiency (chronic) (peripheral): Secondary | ICD-10-CM | POA: Diagnosis not present

## 2022-06-10 DIAGNOSIS — D2372 Other benign neoplasm of skin of left lower limb, including hip: Secondary | ICD-10-CM | POA: Diagnosis not present

## 2022-06-10 DIAGNOSIS — D2362 Other benign neoplasm of skin of left upper limb, including shoulder: Secondary | ICD-10-CM | POA: Diagnosis not present

## 2022-06-10 DIAGNOSIS — Z85828 Personal history of other malignant neoplasm of skin: Secondary | ICD-10-CM | POA: Diagnosis not present

## 2022-06-10 DIAGNOSIS — L821 Other seborrheic keratosis: Secondary | ICD-10-CM | POA: Diagnosis not present

## 2022-06-10 DIAGNOSIS — R21 Rash and other nonspecific skin eruption: Secondary | ICD-10-CM | POA: Diagnosis not present

## 2022-06-10 DIAGNOSIS — D692 Other nonthrombocytopenic purpura: Secondary | ICD-10-CM | POA: Diagnosis not present

## 2022-06-10 DIAGNOSIS — I8311 Varicose veins of right lower extremity with inflammation: Secondary | ICD-10-CM | POA: Diagnosis not present

## 2022-06-10 DIAGNOSIS — D1801 Hemangioma of skin and subcutaneous tissue: Secondary | ICD-10-CM | POA: Diagnosis not present

## 2022-06-10 DIAGNOSIS — I8312 Varicose veins of left lower extremity with inflammation: Secondary | ICD-10-CM | POA: Diagnosis not present

## 2022-06-15 NOTE — Progress Notes (Unsigned)
Cardiology Office Note Date:  06/17/2022  Patient ID:  Glenn [name], Glenn [name] [birth year], MRN [id number] PCP:  Ginger Organ., MD  Cardiologist:  Dr. Lovena Le    Chief Complaint:   planned f/u  History of Present Illness: Glenn [name] is a 82 y.o. male with history of HTN, prostate cancer (s/p prostatectomy), and permanent AFib on a/c.  He saw Dr. Lovena Le Nov 2020.  He was doing well, rate controlled.  Mentioned edema resolved off amlodipine, noted conjunctival bleeding, no plans to stop his OAC,  Monitor BP off amlodipine.     I saw him 07/26/2019 He is pending R total knee arthroplasty requiring clearance.  Pharmacy has completed completed eliquis recommendations (off 3 days). Mentions he was all ready and cleared for his knee surgery last March and cancelled 2/2 COVID.  He says his knee is weak not so much painful and for the first time when playing tennis a week ago or so gave out on him.  He is still playing tennis twice a week and golfing regularly.  He denies any exertional incapacities.   NO CP, no palpitations, no cardiac awareness.  NO rest SOB, no DOE, no symptoms or PND or orthopnea. No dizzy spells, near syncope or syncope. He bled quite a bit after a tooth extraction last year, otherwise no bleeding or signs of bleeidng, tnds to bruise easily. RCRI: score is zero, risk 0.4% DUKE:  8.97METS Felt to be an acceptable cardiac risk for his procedure/surgery planned Recommended continued annual EP visits.  He saw Dr. Lovena Le 08/02/20, off amlodipine with resolved edema, HR and BP looked good, discussed an episode of conjunctival bleedig, not recommended to stop [place].  I saw him 12/09/21 He continues to do well Still playing tennis, very active, good exertional capacity. Rarely has a fleeting vague awareness of his heart beat. No CP No SOB No near syncope or syncope. Bleeds easily with trauma, none otherwise Was seeinghis PMD soon to f/u on his BP No changes were made and planned to  see him in a year  I saw him as a work in visit 04/24/22 He feels well Noted yesterday after getting back from golfing that he felt a little tired and checked his BP, was high 170's/100 or so. Took it again today and again was elevated. He does not heck his BP regularly, had a PMD physical about 2 weeks ago and recalls his BP being fine then. His labs reported as OK as well, his BUN "one point higher then normal" everything else was wnl. No CP, palpitations He will get a little winded when going up/down the stairs a lot, like getting the Christmas decorations out. Otherwise not real exertional intolerances. No near syncope or syncope No bleeding or signs of bleeding He probabaly has been indulging more then usual with his diet over the holidays. Socks are leaving some marks when he takes them off at night the last week or so. Suspected that higher BPs were 2/2 some holiday dietary indiscretions.  Via telephone notes, pt reported persistent higher BPs  and HRs 40's I started him on amlodipine and ordered a monitor Monitor noted stable HRs, nocturnal slower, pauses (4.3 seconds) Had an echo with preserved LVEF, mild LVH P.pressure 50.5 No significant VHD   TODAY His BP has improved, mostly 130's/70's, occasionally gets unusually/random high numbers NO CP, palpitations, SOB Chronic (10+ years) of the bottom of his feet burn when playing tennis, and feel cold at night, he has  seen numerous doctors over the years with no cause/improvement. No near syncope or syncope. NO bleeding or signs of bleeding   Afib Hx Diagnosed incidentally Feb 2012 permanent No AAD hx  Past Medical History:  Diagnosis Date   Atrial fibrillation (HCC)    takes aspirin for this   Brachial plexus injury 1995   Left arm   Dysrhythmia 2009   A-fib   HTN (hypertension)    6 years   Injury of brachial plexus    Prostate cancer [facility])    Prostate cancer ([facility])    Torn rotator cuff    Torn rotator cuff 2000    Right arm    Past Surgical History:  Procedure Laterality Date   ETT  2006   High fitness but bp of 255   Int fixation left forearm  01/19/1989   KNEE SURGERY     left   PROSTATECTOMY     removal of basal cell     TONSILLECTOMY     TOTAL KNEE ARTHROPLASTY Right 08/09/2019   Procedure: TOTAL KNEE ARTHROPLASTY;  Surgeon: Paralee Cancel, MD;  Location: [facility];  Service: Orthopedics;  Laterality: Right;  70 mins    Current Outpatient Medications  Medication Sig Dispense Refill   amoxicillin (AMOXIL) 500 MG capsule Take 2,000 mg by mouth See admin instructions. Take 4 capsules (2000 mg) by mouth 1 hour prior to dental procedures.     apixaban (ELIQUIS) 5 MG TABS tablet Take 1 tablet (5 mg total) by mouth 2 (two) times daily. 180 tablet 3   atorvastatin (LIPITOR) 10 MG tablet Take 1 tablet (10 mg total) by mouth daily. 90 tablet 3   hydrochlorothiazide (HYDRODIURIL) 25 MG tablet Take 1 tablet (25 mg total) by mouth daily. 90 tablet 3   lisinopril (ZESTRIL) 40 MG tablet Take 1 tablet (40 mg total) by mouth daily. 90 tablet 3   tetrahydrozoline 0.05 % ophthalmic solution Place 1-2 drops into both eyes 3 (three) times daily as needed (dry/irritated eyes.).     amLODipine (NORVASC) 5 MG tablet Take 1 tablet (5 mg total) by mouth daily. 90 tablet 3   No current facility-administered medications for this visit.    Allergies:   Patient has no known allergies.   Social History:  The patient  reports that he quit smoking about 37 years ago. His smoking use included cigarettes. He has a 7.50 pack-year smoking history. He has never used smokeless tobacco. He reports current alcohol use. He reports that he does not use drugs.   Family History:  The patient's family history includes Heart attack in his father; Obesity in his brother; Parkinsonism in his brother; Prostate cancer in his brother.  ROS:  Please see the history of present illness.  All other systems are reviewed and otherwise negative.    PHYSICAL EXAM:  VS:  BP (!) 142/78   Pulse 62   Ht '5\' 10"'$  (1.778 m)   Wt 198 lb (89.8 kg)   SpO2 99%   BMI 28.41 kg/m  BMI: Body mass index is 28.41 kg/m. Well nourished, well developed, in no acute distress  HEENT: normocephalic, atraumatic  Neck: no JVD, carotid bruits or masses Cardiac:   irreg-irreg; no significant murmurs, no rubs, or gallops Lungs:   CTA b/l, no wheezing, rhonchi or rales  Abd: soft, nontender MS: no deformity or atrophy Ext:  trace-1+ edema to about mid-shin b/l Skin: warm and dry, no rash Neuro:  No gross deficits appreciated Psych: euthymic mood, full  affect    EKG:  not done today   Jan 2024, monitor Atrial fib with a controlled VR, slow VR and RVR. Nocturnal pauses up to 4.3 seconds are of no clinical consequence as all after midnight and before 6 a.m. [place].  No VT Noise artifact is present.   05/20/22: TTE  1. Left ventricular ejection fraction, by estimation, is 60 to 65%. The  left ventricle has normal function. The left ventricle has no regional  wall motion abnormalities. There is mild left ventricular hypertrophy.  Left ventricular diastolic parameters  are indeterminate.   2. Right ventricular systolic function is normal. The right ventricular  size is mildly enlarged. There is moderately elevated pulmonary artery  systolic pressure. The estimated right ventricular systolic pressure is  123XX123 mmHg.   3. Left atrial size was severely dilated.   4. Right atrial size was severely dilated.   5. The mitral valve is grossly normal. Trivial mitral valve  regurgitation. No evidence of mitral stenosis.   6. The aortic valve is abnormal. There is moderate calcification of the  aortic valve. Aortic valve regurgitation is trivial. Aortic valve  sclerosis/calcification is present, without any evidence of aortic  stenosis.   7. Aortic dilatation noted. There is mild dilatation of the ascending  aorta, measuring 42 mm.   8. The  inferior vena cava is dilated in size with >50% respiratory  variability, suggesting right atrial pressure of 8 mmHg.    04/02/2016: stress myoview Nuclear stress EF: 62%. Blood pressure demonstrated a normal response to exercise. There was no ST segment deviation noted during stress. Findings consistent with ischemia. This is a low risk study Small area of mild apical ischemia EF 62%    06/25/2011: [facility]  Final Conclusions:   1. No significant coronary artery disease 2. Normal left ventricular systolic function   Recent Labs: No results found for requested labs within last 365 days.  No results found for requested labs within last 365 days.   CrCl cannot be calculated (Patient's most recent lab result is older than the maximum 21 days allowed.).   Wt Readings from Last 3 Encounters:  06/17/22 198 lb (89.8 kg)  12/09/21 191 lb 12.8 oz (87 kg)  08/02/20 186 lb (84.4 kg)     Other studies reviewed: Additional studies/records reviewed today include: summarized above  ASSESSMENT AND PLAN:  1. Permanent Afib 2.  Secondary hypercoagulable state      CHA2DS2Vasc is 3, on Eliquis, appropriately dosed by last available labs     Asymptomatic and rate controlled  Avoid nodal blocking agents with HRs 50's-60's      3. HTN     Improved numbers at home    Request labs from his PMD office    Disposition: back in clinic in 6 mo, sooner if needed  Current medicines are reviewed at length with the patient today.  The patient did not have any concerns regarding medicines.  Venetia Night, PA-C 06/17/2022 12:34 PM     [address] [place] [address] [phone] (office)  [phone] (fax)

## 2022-06-17 ENCOUNTER — Encounter: Payer: Self-pay | Admitting: Physician Assistant

## 2022-06-17 ENCOUNTER — Ambulatory Visit: Payer: PPO | Attending: Physician Assistant | Admitting: Physician Assistant

## 2022-06-17 VITALS — BP 142/78 | HR 62 | Ht 70.0 in | Wt 198.0 lb

## 2022-06-17 DIAGNOSIS — D6869 Other thrombophilia: Secondary | ICD-10-CM

## 2022-06-17 DIAGNOSIS — I10 Essential (primary) hypertension: Secondary | ICD-10-CM | POA: Diagnosis not present

## 2022-06-17 DIAGNOSIS — I4821 Permanent atrial fibrillation: Secondary | ICD-10-CM

## 2022-06-17 MED ORDER — AMLODIPINE BESYLATE 5 MG PO TABS: 5.0000 mg | ORAL_TABLET | Freq: Every day | ORAL | 3 refills | Status: DC

## 2022-06-17 NOTE — Patient Instructions (Signed)
Medication Instructions:    START TAKING  AMLODIPINE  5 MG ONCE A DAY   *If you need a refill on your cardiac medications before your next appointment, please call your pharmacy*   Lab Work:  NONE ORDERED  TODAY ( REQUESTING LAB FROM PCP OFFICE)    If you have labs (blood work) drawn today and your tests are completely normal, you will receive your results only by: [place] (if you have MyChart) OR A paper copy in the mail If you have any lab test that is abnormal or we need to change your treatment, we will call you to review the results.   Testing/Procedures: NONE ORDERED  TODAY    Follow-Up: At [facility], you and your health needs are our priority.  As part of our continuing mission to provide you with exceptional heart care, we have created designated Provider Care Teams.  These Care Teams include your primary Cardiologist (physician) and Advanced Practice Providers (APPs -  Physician Assistants and Nurse Practitioners) who all work together to provide you with the care you need, when you need it.  We recommend signing up for the patient portal called "MyChart".  Sign up information is provided on this After Visit Summary.  MyChart is used to connect with patients for Virtual Visits (Telemedicine).  Patients are able to view lab/test results, encounter notes, upcoming appointments, etc.  Non-urgent messages can be sent to your provider as well.   To learn more about what you can do with MyChart, go to [email or web address].    Your next appointment:   6 month(s)  Provider:   Cristopher Peru, MD    Other Instructions

## 2022-06-19 ENCOUNTER — Encounter: Payer: Self-pay | Admitting: Neurology

## 2022-06-19 ENCOUNTER — Ambulatory Visit: Payer: PPO | Admitting: Neurology

## 2022-06-19 VITALS — BP 161/53 | HR 61 | Ht 67.0 in | Wt 202.0 lb

## 2022-06-19 DIAGNOSIS — I4821 Permanent atrial fibrillation: Secondary | ICD-10-CM

## 2022-06-19 DIAGNOSIS — G478 Other sleep disorders: Secondary | ICD-10-CM | POA: Diagnosis not present

## 2022-06-19 DIAGNOSIS — G8929 Other chronic pain: Secondary | ICD-10-CM | POA: Diagnosis not present

## 2022-06-19 DIAGNOSIS — Z9189 Other specified personal risk factors, not elsewhere classified: Secondary | ICD-10-CM

## 2022-06-19 DIAGNOSIS — C61 Malignant neoplasm of prostate: Secondary | ICD-10-CM | POA: Diagnosis not present

## 2022-06-19 NOTE — Progress Notes (Signed)
SLEEP MEDICINE CLINIC    Provider:  Larey Seat, MD  Primary Care Physician:  Ginger Organ., MD [place] [address]     Referring Provider: Ginger Organ., Md [address] [place],  [place] [address]          Chief Complaint according to patient   Patient presents with:     New Patient (Initial Visit)     Patient to be evaluated for sleepiness.       HISTORY OF PRESENT ILLNESS:  Glenn [name] is a 82 y.o. male patient who is seen upon referral by PCP Dr Brigitte Pulse- Consultation on 06/19/2022  Chief concern according to patient :       Hommer L Coley reported on 06/19/22 :  I never had a problem going to sleep, but I feel  my sleep is less restful, shoulder pain is a factor, I can't sleep on my left arm. My orthopedist recommends a replacement. I still play tennis and gold and feel tired after that, mid-afternoon drowsiness. "  "I drive to our summer home in [place] and can do that in one day"   The patient never had a sleep study . He is followed for atrial fibrillation.   Sleep relevant medical history: a fib. Nocturia: none , in spite of total prostate ectomy at [facility] in 1995, some incontinence is present. Sleep walking;none.  Had a Tonsillectomy in childhood, cervical spine - neck pain, shoulder pain.   Family medical /sleep history: no other biological family member on CPAP with OSA, insomnia, sleep walkers.    Social history: Patient is working as part time/ retired from Personal assistant and lives in a household with spouse,   Family status is married, with 3 adult children, 7 grandchildren, 6 living.  The patient currently works part-timer Tobacco use; quit 1987.   ETOH use ; socially- WE 3-4 drinks per week   Caffeine intake in form of Coffee( 1-2 cups in AM ) Soda( /) Tea ( iced- when eating out ) on energy drinks Exercise in form of golf, tennis.   Hobbies :traveling, water sports , Scientist, research (medical).       Sleep habits are as  follows: The patient's dinner time is between 6 PM. The patient goes to bed at 11 PM and continues to sleep for 6-7 hours, no bathroom breaks. The preferred sleep position is laterally , with the support of 2 pillows. Dreams are reportedly frequent/ not vivid.   The patient wakes up spontaneously. 6.30  AM is the usual rise time. He reports not feeling refreshed or restored in AM, with symptoms such as ( rare) dry mouth, no morning headaches, and residual fatigue.  Naps are taken infrequently, once a month -lasting from 20 to 45 minutes .   Review of Systems: Out of a complete 14 system review, the patient complains of only the following symptoms, and all other reviewed systems are negative.:    Hearing loss, partial dentures.  Fatigue, sleepiness , snoring, no RLS,  Hot burning feet after walking or 90 minutes of tennis.     How likely are you to doze in the following situations: 0 = not likely, 1 = slight chance, 2 = moderate chance, 3 = high chance   Sitting and Reading? Watching Television? Sitting inactive in a public place (theater or meeting)? As a passenger in a car for an hour without a break? Lying down in the afternoon  when circumstances permit? Sitting and talking to someone? Sitting quietly after lunch without alcohol? In a car, while stopped for a few minutes in traffic?   Total = 7/ 24 points   FSS endorsed at 24/ 63 points.   Social History   Socioeconomic History   Marital status: Married    Spouse name: Not on file   Number of children: Not on file   Years of education: Not on file   Highest education level: Not on file  Occupational History   Occupation: Architectural technologist: Retired  Tobacco Use   Smoking status: Former    Packs/day: 0.50    Years: 15.00    Total pack years: 7.50    Types: Cigarettes    Quit date: 04/21/1985    Years since quitting: 37.1   Smokeless tobacco: Never  Vaping Use   Vaping Use: Never used  Substance and Sexual Activity    Alcohol use: Yes    Comment: Social   Drug use: No   Sexual activity: Not on file  Other Topics Concern   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[employer] after 2 final fours   married to [place]; 2 male and 1 male children;    multiple grandkids;    non smoker - quit 25 years ago   social ETOH   Social Determinants of Radio broadcast assistant Strain: Not on file  Food Insecurity: Not on file  Transportation Needs: Not on file  Physical Activity: Not on file  Stress: Not on file  Social Connections: Not on file    Family History  Problem Relation Age of Onset   Heart attack Father        MI   Obesity Brother    Parkinsonism Brother    Prostate cancer Brother    Colon cancer Neg Hx    Esophageal cancer Neg Hx    Stomach cancer Neg Hx    Rectal cancer Neg Hx     Past Medical History:  Diagnosis Date   Atrial fibrillation ([place])    takes aspirin for this   Brachial plexus injury 1995   Left arm   Dysrhythmia 2009   A-fib   HTN (hypertension)    6 years   Injury of brachial plexus    Prostate cancer ([place])    Prostate cancer ([place])    Torn rotator cuff    Torn rotator cuff 2000   Right arm    Past Surgical History:  Procedure Laterality Date   ETT  2006   High fitness but bp of 255   Int fixation left forearm  01/19/1989   KNEE SURGERY     left   PROSTATECTOMY     removal of basal cell     TONSILLECTOMY     TOTAL KNEE ARTHROPLASTY Right 08/09/2019   Procedure: TOTAL KNEE ARTHROPLASTY;  Surgeon: Paralee Cancel, MD;  Location: [facility];  Service: Orthopedics;  Laterality: Right;  70 mins     Current Outpatient Medications on File Prior to Visit  Medication Sig Dispense Refill   amLODipine (NORVASC) 5 MG tablet Take 1 tablet (5 mg total) by mouth daily. 90 tablet 3   amoxicillin (AMOXIL) 500 MG capsule Take 2,000 mg by mouth See admin instructions. Take 4 capsules (2000 mg) by mouth 1 hour  prior to dental procedures.     apixaban (ELIQUIS) 5 MG  TABS tablet Take 1 tablet (5 mg total) by mouth 2 (two) times daily. 180 tablet 3   atorvastatin (LIPITOR) 10 MG tablet Take 1 tablet (10 mg total) by mouth daily. 90 tablet 3   hydrochlorothiazide (HYDRODIURIL) 25 MG tablet Take 1 tablet (25 mg total) by mouth daily. 90 tablet 3   lisinopril (ZESTRIL) 40 MG tablet Take 1 tablet (40 mg total) by mouth daily. 90 tablet 3   tetrahydrozoline 0.05 % ophthalmic solution Place 1-2 drops into both eyes 3 (three) times daily as needed (dry/irritated eyes.).     No current facility-administered medications on file prior to visit.    No Known Allergies   DIAGNOSTIC DATA (LABS, IMAGING, TESTING) - I reviewed patient records, labs, notes, testing and imaging myself where available.  Echo was with normal EF . 05-20-2022:  trivial mitral valve , mild aortic dilation.   ZIO PATCH with bradycardia at night- can mean OSA.   Lab Results  Component Value Date   WBC 20.1 (H) 08/10/2019   HGB 13.0 08/10/2019   HCT 39.4 08/10/2019   MCV 95.4 08/10/2019   PLT 205 08/10/2019      Component Value Date/Time   NA 135 08/10/2019 0323   NA 141 08/04/2017 1047   K 3.9 08/10/2019 0323   CL 104 08/10/2019 0323   CO2 24 08/10/2019 0323   GLUCOSE 182 (H) 08/10/2019 0323   BUN 26 (H) 08/10/2019 0323   BUN 19 08/04/2017 1047   CREATININE 1.18 08/10/2019 0323   CALCIUM 8.1 (L) 08/10/2019 0323   PROT 6.7 07/29/2019 1337   PROT 6.4 07/08/2016 0842   ALBUMIN 3.8 07/29/2019 1337   ALBUMIN 4.0 07/08/2016 0842   AST 24 07/29/2019 1337   ALT 23 07/29/2019 1337   ALKPHOS 66 07/29/2019 1337   BILITOT 1.0 07/29/2019 1337   BILITOT 0.7 07/08/2016 0842   GFRNONAA 59 (L) 08/10/2019 0323   GFRAA >60 08/10/2019 0323   Lab Results  Component Value Date   CHOL 170 07/08/2016   HDL 61 07/08/2016   LDLCALC 101 (H) 07/08/2016   TRIG 42 07/08/2016   CHOLHDL 2.8 07/08/2016   Lab Results  Component Value Date    HGBA1C 6.0 (H) 08/09/2019   No results found for: "VITAMINB12" No results found for: "TSH"  PHYSICAL EXAM:  Today's Vitals   06/19/22 1021  BP: (!) 161/53  Pulse: 61  Weight: 202 lb (91.6 kg)  Height: '5\' 7"'$  (1.702 m)   Body mass index is 31.64 kg/m.   Wt Readings from Last 3 Encounters:  06/19/22 202 lb (91.6 kg)  06/17/22 198 lb (89.8 kg)  12/09/21 191 lb 12.8 oz (87 kg)     Ht Readings from Last 3 Encounters:  06/19/22 '5\' 7"'$  (1.702 m)  06/17/22 '5\' 10"'$  (1.778 m)  12/09/21 '5\' 10"'$  (1.778 m)      General: The patient is awake, alert and appears not in acute distress. The patient is well groomed. Head: Normocephalic, atraumatic. Neck is supple.  Mallampati 2,  neck circumference:18.25 inches . Nasal airflow  patent.  Retrognathia is not seen.  Dental status: partial  Cardiovascular:  Regular rate and cardiac rhythm by pulse,  without distended neck veins. Respiratory: Lungs are clear to auscultation.  Skin:  With evidence of ankle edema. Trunk: The patient's posture is erect.   NEUROLOGIC EXAM: The patient is awake and alert, oriented to place and time.   Memory subjective described as intact.  Attention span & concentration ability appears  normal.  Speech is fluent,  without dysarthria, dysphonia or aphasia.  Mood and affect are appropriate.   Cranial nerves: no loss of smell or taste reported  Pupils are equal and briskly reactive to light. Funduscopic exam deferred.  Extraocular movements in vertical and horizontal planes were intact and without nystagmus.  No Diplopia. Visual fields by finger perimetry are intact. Hearing was impaired- bilaterally    Facial sensation intact to fine touch.  Facial motor strength is symmetric and tongue and uvula move midline.  Neck ROM : rotation, tilt and flexion extension were normal for age and shoulder shrug was symmetrical.    Motor exam:  Symmetric bulk, tone and ROM.   Elevated base tone without cog -wheeling,  protective ? There is left lesser  grip strength .   Sensory:  Fine touch,  and vibration were felt reduced at either ankle. Proprioception tested in the upper extremities was normal.   Coordination: Rapid alternating movements in the fingers/hands were of normal speed.  The Finger-to-nose maneuver was intact without evidence of ataxia, dysmetria or tremor.   Gait and station: Patient could rise unassisted from a seated position, walked without assistive device.  Stance is of normal width/ base .  Toe and heel walk were deferred.  Deep tendon reflexes: in the  upper extremities are symmetric and intact.  Patella is reduced, status post total knee.  Babinski response was deferred.    ASSESSMENT AND PLAN 82 y.o. year old male patient  here with:    1) atrial fibrillation dx 2014, non symptomatic on Eloquis. Should for this condition alone be checked for OSA.  Recently had a ZIO patch and no a fib  was captured.  ZIO PATCH with bradycardia at night- can mean OSA.   2) has a painful condition of the left shoulder, this wakes him from sleep- he likes to avoid supine sleep,but can't stay asleep on his left side.   3) athletic, overall healthy and active  individual.  His muscle tone and left grip strength are affected  due to pain radiating form the shoulder.  His bradycardia can be related to a " sportsman's heart"   4) Tiredness in daytime could be atrial fib related, and he also is on BP medication - but none  with potential sedative side effect. It is worth screening this gentleman for sleep apnea in a HS or in lab test( I will order both- what ever is available first) .    I plan to follow up either personally or through our NP within 4-5 months.   I would like to thank Ginger Organ., MD and Ginger Organ., Md [address] [place],  [place] [address] for allowing me to meet with and to take care of this pleasant patient.   CC: I will share my notes with PCP and Dr Crissie Sickles, MD.  After spending a total time of  45  minutes face to face and additional time for physical and neurologic examination, review of laboratory studies,  personal review of imaging studies, reports and results of other testing and review of referral information / records as far as provided in visit,   Electronically signed by: Larey Seat, MD 06/19/2022 10:35 AM  [facility] and [facility] Sleep Board certified by The [employer] of Sleep Medicine and Diplomate of the [employer] of Sleep Medicine. Board certified In Neurology through the [place], Fellow of the [employer] of Neurology. Market researcher of [employer]  [facility].

## 2022-06-19 NOTE — Patient Instructions (Signed)
Atrial Fibrillation Atrial fibrillation (AFib) is a type of irregular or rapid heartbeat (arrhythmia). In AFib, the top part of the heart (atria) beats in an irregular pattern. This makes the heart unable to pump blood normally and effectively. The goal of treatment is to prevent blood clots from forming, control your heart rate, or restore your heartbeat to a normal rhythm. If this condition is not treated, it can cause serious problems, such as a weakened heart muscle (cardiomyopathy) or a stroke. What are the causes? This condition is often caused by medical conditions that damage the heart's electrical system. These include: High blood pressure (hypertension). This is the most common cause. Certain heart problems or conditions, such as heart failure, coronary artery disease, heart valve problems, or heart surgery. Diabetes. Overactive thyroid (hyperthyroidism). Chronic kidney disease. Certain lung conditions, such as emphysema, pneumonia, or COPD. Obstructive sleep apnea. In some cases, the cause of this condition is not known. What increases the risk? This condition is more likely to develop in: Older adults. Athletes who do endurance exercise. People who have a family history of AFib. Males. People who are Caucasian. People who are obese. People who smoke or misuse alcohol. What are the signs or symptoms? Symptoms of this condition include: Fast or irregular heartbeats (palpitations). Discomfort or pain in your chest. Shortness of breath. Sudden light-headedness or weakness. Tiring easily during exercise or activity. Syncope (fainting). Sweating. In some cases, there are no symptoms. How is this diagnosed? Your health care provider may detect AFib when taking your pulse. If detected, this condition may be diagnosed with: An electrocardiogram (ECG) to check electrical signals of the heart. An ambulatory cardiac monitor to record your heart's activity for a few days. A  transthoracic echocardiogram (TTE) to create pictures of your heart. A transesophageal echocardiogram (TEE) to create even clearer pictures of your heart. A stress test to check your blood supply while you exercise. Imaging tests, such as a CT scan or chest X-[place]. Blood tests. How is this treated? Treatment depends on underlying conditions and how you feel when you get AFib. This condition may be treated with: Medicines to prevent blood clots or to treat heart rate or heart rhythm problems. Electrical cardioversion to reset the heart's rhythm. A pacemaker to correct abnormal heart rhythm. Ablation to remove the heart tissue that sends abnormal signals. Left atrial appendage closure to seal the area where blood clots can form. In some cases, underlying conditions will be treated. Follow these instructions at home: Medicines Take over-the counter and prescription medicines only as told by your provider. Do not take any new medicines without talking to your provider. If you are taking blood thinners: Talk with your provider before taking aspirin or NSAIDs. These medicines can raise your risk of bleeding. Take your medicines as told. Take them at the same time each day. Do not do things that could hurt or bruise you. Be careful to avoid falls. Wear an alert bracelet or carry a card that says that you take blood thinners. Lifestyle Do not use any products that contain nicotine or tobacco. These products include cigarettes, chewing tobacco, and vaping devices, such as e-cigarettes. If you need help quitting, ask your provider. Eat heart-healthy foods. Talk with a food expert (dietitian) to make an eating plan that is right for you. Exercise regularly as told by your provider. Do not drink alcohol. Lose weight if you are overweight. General instructions If you have obstructive sleep apnea, manage your condition as told by your provider.  Do not use diet pills unless your provider approves. Diet  pills can make heart problems worse. Keep all follow-up visits. Your provider will want to check your heart rate and rhythm regularly. Contact a health care provider if: You notice a change in the rate, rhythm, or strength of your heartbeat. You are taking a blood thinner and you notice more bruising. You tire more easily when you exercise or do heavy work. You have a sudden change in weight. Get help right away if:  You have chest pain. You have trouble breathing. You have side effects of blood thinners, such as blood in your vomit, poop (stool), or pee (urine), or bleeding that does not stop. You have any symptoms of a stroke. "BE FAST" is an easy way to remember the main warning signs of a stroke: B - Balance. Signs are dizziness, sudden trouble walking, or loss of balance. E - Eyes. Signs are trouble seeing or a sudden change in vision. F - Face. Signs are sudden weakness or numbness of the face, or the face or eyelid drooping on one side. A - Arms. Signs are weakness or numbness in an arm. This happens suddenly and usually on one side of the body. S - Speech.Signs are sudden trouble speaking, slurred speech, or trouble understanding what people say. T - Time. Time to call emergency services. Write down what time symptoms started. Other signs of a stroke, such as: A sudden, severe headache with no known cause. Nausea or vomiting. Seizure. These symptoms may be an emergency. Get help right away. Call 911. Do not wait to see if the symptoms will go away. Do not drive yourself to the hospital. This information is not intended to replace advice given to you by your health care provider. Make sure you discuss any questions you have with your health care provider. Document Revised: 12/25/2021 Document Reviewed: 12/25/2021 Elsevier Patient Education  [place]. Bradycardia, Adult Bradycardia is a slower-than-normal heartbeat. A normal resting heart rate for an adult ranges from 60  to 100 beats per minute. With bradycardia, the resting heart rate is less than 60 beats per minute. Bradycardia can prevent enough oxygen from reaching certain areas of your body when you are active. It can be serious if it keeps enough oxygen from reaching your brain and other parts of your body. Bradycardia is not a problem for everyone. For some healthy adults, a slow resting heart rate is normal. What are the causes? This condition may be caused by: A problem with the heart, including: A problem with the heart's electrical system, such as a heart block. With a heart block, electrical signals between the chambers of the heart are partially or completely blocked, so they are not able to work as they should. A problem with the heart's natural pacemaker (sinus node). Heart disease. A heart attack. Heart damage. Lyme disease. A heart infection. A heart condition that is present at birth (congenital heart defect). Certain medicines that treat heart conditions. Certain conditions, such as hypothyroidism and obstructive sleep apnea. Problems with the balance of chemicals and other substances, like potassium, in the blood. Trauma. Radiation therapy. What increases the risk? You are more likely to develop this condition if you: Are age 51 or older. Have high blood pressure (hypertension), high cholesterol (hyperlipidemia), or diabetes. Drink heavily, use tobacco or nicotine products, or use drugs. What are the signs or symptoms? Symptoms of this condition include: Light-headedness. Feeling faint or fainting. Fatigue and weakness. Trouble with activity  or exercise. Shortness of breath. Chest pain (angina). Drowsiness. Confusion. Dizziness. How is this diagnosed? This condition may be diagnosed based on: Your symptoms. Your medical history. A physical exam. During the exam, your health care provider will listen to your heartbeat and check your pulse. To confirm the diagnosis, your  health care provider may order tests, such as: Blood tests. An electrocardiogram (ECG). This test records the heart's electrical activity. The test can show how fast your heart is beating and whether the heartbeat is steady. A test in which you wear a portable device (event recorder or Holter monitor) to record your heart's electrical activity while you go about your day. An exercise test. How is this treated? Treatment for this condition depends on the cause of the condition and how severe your symptoms are. Treatment may involve: Treatment of the underlying condition. Changing your medicines or how much medicine you take. Having a small, battery-operated device called a pacemaker implanted under the skin. When bradycardia occurs, this device can be used to increase your heart rate and help your heart beat in a regular rhythm. Follow these instructions at home: Lifestyle Manage any health conditions that contribute to bradycardia as told by your health care provider. Follow a heart-healthy diet. A nutrition specialist (dietitian) can help educate you about healthy food options and changes. Follow an exercise program that is approved by your health care provider. Maintain a healthy weight. Try to reduce or manage your stress, such as with yoga or meditation. If you need help reducing stress, ask your health care provider. Do not use any products that contain nicotine or tobacco. These products include cigarettes, chewing tobacco, and vaping devices, such as e-cigarettes. If you need help quitting, ask your health care provider. Do not use illegal drugs. Alcohol use If you drink alcohol: Limit how much you have to: 0-1 drink a day for women who are not pregnant. 0-2 drinks a day for men. Know how much alcohol is in a drink. In the [place] one drink equals one 12 oz bottle of beer (355 mL), one 5 oz glass of wine (148 mL), or one 1 oz glass of hard liquor (44 mL). General instructions Take  over-the-counter and prescription medicines only as told by your health care provider. Keep all follow-up visits. This is important. How is this prevented? In some cases, bradycardia may be prevented by: Treating underlying medical problems. Stopping behaviors or medicines that can trigger the condition. Contact a health care provider if: You feel light-headed or dizzy. You almost faint. You feel weak or are easily fatigued during physical activity. You experience confusion or have memory problems. Get help right away if: You faint. You have chest pains or an irregular heartbeat (palpitations). You have trouble breathing. These symptoms may represent a serious problem that is an emergency. Do not wait to see if the symptoms will go away. Get medical help right away. Call your local emergency services (911 in the [place] Do not drive yourself to the hospital. Summary Bradycardia is a slower-than-normal heartbeat. With bradycardia, the resting heart rate is less than 60 beats per minute. Treatment for this condition depends on the cause. Manage any health conditions that contribute to bradycardia as told by your health care provider. Do not use any products that contain nicotine or tobacco. These products include cigarettes, chewing tobacco, and vaping devices, such as e-cigarettes. Keep all follow-up visits. This is important. This information is not intended to replace advice given to you by your health  care provider. Make sure you discuss any questions you have with your health care provider. Document Revised: 07/29/2020 Document Reviewed: 07/29/2020 Elsevier Patient Education  [place].

## 2022-06-20 ENCOUNTER — Telehealth: Payer: Self-pay | Admitting: Internal Medicine

## 2022-06-20 MED ORDER — AMLODIPINE BESYLATE 5 MG PO TABS: 5.0000 mg | ORAL_TABLET | Freq: Every day | ORAL | 3 refills | Status: DC

## 2022-06-20 NOTE — Telephone Encounter (Signed)
Pt's medication was sent to pt's pharmacy as requested. Confirmation received.  °

## 2022-06-20 NOTE — Telephone Encounter (Signed)
*  STAT* If patient is at the pharmacy, call can be transferred to refill team.   1. Which medications need to be refilled? (please list name of each medication and dose if known) amLODipine (NORVASC) 5 MG tablet  2. Which pharmacy/location (including street and city if local pharmacy) is medication to be sent to? [facility] [id number] [address], [address]  3. Do they need a 30 day or 90 day supply? 90 Day  Pt called stating that the medication dosage was increased per his last visit on 06/17/22, but the pharmacy hasn't received the refill when he checked on it yesterday afternoon. He is starting to run low on medication.

## 2022-07-01 ENCOUNTER — Telehealth: Payer: Self-pay | Admitting: Neurology

## 2022-07-01 NOTE — Telephone Encounter (Signed)
HST-HTA Glenn KaufmannBD:[id number] (exp. 06/23/22 to 09/21/22)   Patient is scheduled at [facility] for 4/2/224 at 11 AM.  Mailed packet to the patient.

## 2022-07-03 DIAGNOSIS — Z85828 Personal history of other malignant neoplasm of skin: Secondary | ICD-10-CM | POA: Diagnosis not present

## 2022-07-03 DIAGNOSIS — H25813 Combined forms of age-related cataract, bilateral: Secondary | ICD-10-CM | POA: Diagnosis not present

## 2022-07-03 DIAGNOSIS — H5203 Hypermetropia, bilateral: Secondary | ICD-10-CM | POA: Diagnosis not present

## 2022-07-03 DIAGNOSIS — L988 Other specified disorders of the skin and subcutaneous tissue: Secondary | ICD-10-CM | POA: Diagnosis not present

## 2022-07-03 DIAGNOSIS — H524 Presbyopia: Secondary | ICD-10-CM | POA: Diagnosis not present

## 2022-07-03 DIAGNOSIS — D0361 Melanoma in situ of right upper limb, including shoulder: Secondary | ICD-10-CM | POA: Diagnosis not present

## 2022-07-07 DIAGNOSIS — I10 Essential (primary) hypertension: Secondary | ICD-10-CM | POA: Diagnosis not present

## 2022-07-07 DIAGNOSIS — Z125 Encounter for screening for malignant neoplasm of prostate: Secondary | ICD-10-CM | POA: Diagnosis not present

## 2022-07-07 DIAGNOSIS — R7301 Impaired fasting glucose: Secondary | ICD-10-CM | POA: Diagnosis not present

## 2022-07-07 DIAGNOSIS — E785 Hyperlipidemia, unspecified: Secondary | ICD-10-CM | POA: Diagnosis not present

## 2022-07-14 DIAGNOSIS — I10 Essential (primary) hypertension: Secondary | ICD-10-CM | POA: Diagnosis not present

## 2022-07-14 DIAGNOSIS — N1831 Chronic kidney disease, stage 3a: Secondary | ICD-10-CM | POA: Diagnosis not present

## 2022-07-14 DIAGNOSIS — I4821 Permanent atrial fibrillation: Secondary | ICD-10-CM | POA: Diagnosis not present

## 2022-07-14 DIAGNOSIS — I700 Atherosclerosis of aorta: Secondary | ICD-10-CM | POA: Diagnosis not present

## 2022-07-14 DIAGNOSIS — Z1389 Encounter for screening for other disorder: Secondary | ICD-10-CM | POA: Diagnosis not present

## 2022-07-14 DIAGNOSIS — G6289 Other specified polyneuropathies: Secondary | ICD-10-CM | POA: Diagnosis not present

## 2022-07-14 DIAGNOSIS — M25512 Pain in left shoulder: Secondary | ICD-10-CM | POA: Diagnosis not present

## 2022-07-14 DIAGNOSIS — Z1331 Encounter for screening for depression: Secondary | ICD-10-CM | POA: Diagnosis not present

## 2022-07-14 DIAGNOSIS — D6869 Other thrombophilia: Secondary | ICD-10-CM | POA: Diagnosis not present

## 2022-07-14 DIAGNOSIS — Z87891 Personal history of nicotine dependence: Secondary | ICD-10-CM | POA: Diagnosis not present

## 2022-07-14 DIAGNOSIS — Z23 Encounter for immunization: Secondary | ICD-10-CM | POA: Diagnosis not present

## 2022-07-14 DIAGNOSIS — D692 Other nonthrombocytopenic purpura: Secondary | ICD-10-CM | POA: Diagnosis not present

## 2022-07-14 DIAGNOSIS — N39490 Overflow incontinence: Secondary | ICD-10-CM | POA: Diagnosis not present

## 2022-07-14 DIAGNOSIS — Z0000 Encounter for general adult medical examination without abnormal findings: Secondary | ICD-10-CM | POA: Diagnosis not present

## 2022-07-14 DIAGNOSIS — R200 Anesthesia of skin: Secondary | ICD-10-CM | POA: Diagnosis not present

## 2022-07-14 DIAGNOSIS — R82998 Other abnormal findings in urine: Secondary | ICD-10-CM | POA: Diagnosis not present

## 2022-07-14 DIAGNOSIS — E785 Hyperlipidemia, unspecified: Secondary | ICD-10-CM | POA: Diagnosis not present

## 2022-07-15 ENCOUNTER — Other Ambulatory Visit: Payer: Self-pay | Admitting: Internal Medicine

## 2022-07-15 DIAGNOSIS — Z87891 Personal history of nicotine dependence: Secondary | ICD-10-CM

## 2022-07-16 ENCOUNTER — Telehealth: Payer: Self-pay

## 2022-07-16 NOTE — Telephone Encounter (Signed)
-----   Message from Irene Shipper, MD sent at 07/15/2022  5:21 PM EDT ----- Regarding: RE: OV to discuss colonoscopy Sure. Thanks  ----- Message ----- From: Algernon Huxley, RN Sent: 07/15/2022   4:24 PM EDT To: Irene Shipper, MD Subject: RE: OV to discuss colonoscopy                  Do you want me to use one of the 3 held spots on 4/11?  ----- Message ----- From: Irene Shipper, MD Sent: 07/15/2022  12:41 PM EDT To: Irene Shipper, MD; Algernon Huxley, RN Subject: Melton Alar: OV to discuss colonoscopy                  Vaughan Basta, Dr. Brigitte Pulse reached out to me regarding this patient.  He wanted to discuss surveillance colonoscopy. I know this gentleman well.  Former Industrial/product designer. It would be best, at his convenience and if he agrees, that he come in for an office visit to discuss surveillance colonoscopy.  He is on a blood thinner. Thanks, Dr. Henrene Pastor  ----- Message ----- From: Irene Shipper, MD Sent: 07/14/2022   9:11 PM EDT To: Irene Shipper, MD Subject: OV to discuss colonoscopy

## 2022-07-16 NOTE — Telephone Encounter (Signed)
Pt scheduled to see Dr. Henrene Pastor 07/31/22 at 10:20am. Pt aware of appt.

## 2022-07-22 ENCOUNTER — Ambulatory Visit: Payer: PPO | Admitting: Neurology

## 2022-07-22 DIAGNOSIS — G8929 Other chronic pain: Secondary | ICD-10-CM

## 2022-07-22 DIAGNOSIS — G4733 Obstructive sleep apnea (adult) (pediatric): Secondary | ICD-10-CM

## 2022-07-22 DIAGNOSIS — G4731 Primary central sleep apnea: Secondary | ICD-10-CM

## 2022-07-22 DIAGNOSIS — C61 Malignant neoplasm of prostate: Secondary | ICD-10-CM

## 2022-07-22 DIAGNOSIS — I4821 Permanent atrial fibrillation: Secondary | ICD-10-CM

## 2022-07-22 DIAGNOSIS — G478 Other sleep disorders: Secondary | ICD-10-CM

## 2022-07-22 DIAGNOSIS — Z9189 Other specified personal risk factors, not elsewhere classified: Secondary | ICD-10-CM

## 2022-07-24 NOTE — Progress Notes (Signed)
[facility] SLEEP TEST REPORT ( by Watch PAT)   STUDY DATE:  07-24-2022 DOB:  16-Feb-1941 MRN: [id number]     ORDERING CLINICIAN: Melvyn Novas, MD  REFERRING CLINICIAN: Dr Sandra Cockayne, MD    CLINICAL INFORMATION/HISTORY: New patient consultation/ Glenn [name] reported on 06/19/22 : " I have never had a problem going to sleep, but I feel that my sleep is less restful, shoulder pain is a factor. I can't sleep on my left arm. My orthopedist recommends a replacement. I still play tennis and golf and feel tired after that, having now mid-afternoon drowsiness. "  "I drive to our summer home in [place] and can do that in one day".     The patient never had a sleep study . He is followed for atrial fibrillation.   Sleep relevant medical history: a fib. Nocturia: none , in spite of total prostatectomy at [facility] in 1995, but some incontinence is present. Had a Tonsillectomy in childhood, DDD -cervical spine - neck pain, shoulder pain. No RLS.   Epworth sleepiness score:7  /24. FSS at  24/ 63 points.    BMI: 31.6 kg/m   Neck Circumference: 18.25 "   FINDINGS:   Sleep Summary:   Total Recording Time (hours, min):   7 hours 6 minutes  Total Sleep Time (hours, min): 6 hours 17 minutes                 Percent REM (%):   23%                                     Respiratory Indices:   Calculated pAHI (per hour by AASM):    35.1/h                         REM pAHI:       33.1/h                                          NREM pAHI:    35.7/h                          Positional AHI: For the majority of the recorded time the patient rested in supine position with an AHI of 41.8/h.  This was followed by right lateral sleep 407 minutes with an AHI of 16.7/h, and left lateral sleep with an AHI of 41.7/h.    Snoring reached a mean volume of 40 dB, and was present for less than 10% of total recording time.                                                Oxygen Saturation Statistics:       O2 Saturation Range (%):    Between a nadir at 83% of the maximum saturation of 100% with a mean saturation of 95%.  O2 Saturation (minutes) <89%:   3.2 minutes     Many brief oxygen desaturation events were associated with supine REM sleep .  Pulse Rate Statistics:   Pulse Mean (bpm):    51 bpm             Pulse Range: Between 30 and 83 bpm.  Pulse rate data show a high variability throughout the night in all sleep stages and positions.               IMPRESSION:  This HST confirms the presence of severe and complex sleep apnea with about 40% central sleep apneas as calculated by home sleep test. AHI was 35/h.   157 brief oxygen desaturation events were also recorded.   The patient had a highly variable heart rate throughout the night with intermittent bradycardia to the 30 bpm range(!).   RECOMMENDATION: I suspect that the patient was in persistent atrial fibrillation throughout the night.  His apnea hypopnea index is severe and consists of central as well as obstructive sleep apnea, for which PAP treatment is immediately recommended.  My plan A would be an in- lab titration due to severity and type of apnea, and the likely associated diastolic heart failure.  Unfortunately, we may not have open slots available .  I will place this patient on a cancellation list, but we will likely need to progress to Plan B.   Plan B : In order to have a quick treatment available I will place this patient on an auto titration CPAP between 5 and 18 cm water pressure with 3 cm EPR, heated humidification and an interface of his choice.  I want the patient to be vocal about any discomfort he may experience, difficulties with CPAP use or mask fit shall be addressed with the medical equipment company within 30 days of the device being issued. We will then follow up in our sleep clinic between 30 and 89 days of  CPAP therapy.        INTERPRETING PHYSICIAN:  Melvyn Novas, MD   Board certified in Neurology and Sleep Medicine  by the ABSM and ABPN  [facility].

## 2022-07-25 DIAGNOSIS — G4731 Primary central sleep apnea: Secondary | ICD-10-CM | POA: Insufficient documentation

## 2022-07-25 NOTE — Procedures (Signed)
[facility] SLEEP TEST REPORT ( by Watch PAT)   STUDY DATE:  07-24-2022 DOB:  Apr 04, 1941 MRN: [id number]     ORDERING CLINICIAN: Melvyn Novas, MD  REFERRING CLINICIAN: Dr Sandra Cockayne, MD    CLINICAL INFORMATION/HISTORY: New patient consultation/ Glenn [name] reported on 06/19/22 : " I have never had a problem going to sleep, but I feel that my sleep is less restful, shoulder pain is a factor. I can't sleep on my left arm. My orthopedist recommends a replacement. I still play tennis and golf and feel tired after that, having now mid-afternoon drowsiness. "  "I drive to our summer home in [place] and can do that in one day".     The patient never had a sleep study . He is followed for atrial fibrillation.   Sleep relevant medical history: a fib. Nocturia: none , in spite of total prostatectomy at [facility] in 1995, but some incontinence is present. Had a Tonsillectomy in childhood, DDD -cervical spine - neck pain, shoulder pain. No RLS.   Epworth sleepiness score:7  /24. FSS at  24/ 63 points.    BMI: 31.6 kg/m   Neck Circumference: 18.25 "   FINDINGS:   Sleep Summary:   Total Recording Time (hours, min):   7 hours 6 minutes  Total Sleep Time (hours, min): 6 hours 17 minutes                 Percent REM (%):   23%                                     Respiratory Indices:   Calculated pAHI (per hour by AASM):    35.1/h                         REM pAHI:       33.1/h                                          NREM pAHI:    35.7/h                          Positional AHI: For the majority of the recorded time the patient rested in supine position with an AHI of 41.8/h.  This was followed by right lateral sleep 407 minutes with an AHI of 16.7/h, and left lateral sleep with an AHI of 41.7/h.    Snoring reached a mean volume of 40 dB, and was present for less than 10% of total recording time.                                               Oxygen  Saturation Statistics:       O2 Saturation Range (%):    Between a nadir at 83% of the maximum saturation of 100% with a mean saturation of 95%.  O2 Saturation (minutes) <89%:   3.2 minutes     Many brief oxygen desaturation events were associated with supine REM sleep .  Pulse Rate Statistics:   Pulse Mean (bpm):    51 bpm             Pulse Range: Between 30 and 83 bpm.  Pulse rate data show a high variability throughout the night in all sleep stages and positions.               IMPRESSION:  This HST confirms the presence of severe and complex sleep apnea with about 40% central sleep apneas as calculated by home sleep test. AHI was 35/h.   157 brief oxygen desaturation events were also recorded.   The patient had a highly variable heart rate throughout the night with intermittent bradycardia to the 30 bpm range(!).   RECOMMENDATION: I suspect that the patient was in persistent atrial fibrillation throughout the night.  His apnea hypopnea index is severe and consists of central as well as obstructive sleep apnea, for which PAP treatment is immediately recommended.  My plan A would be an in- lab titration due to severity and type of apnea, and the likely associated diastolic heart failure.  Unfortunately, we may not have open slots available .  I will place this patient on a cancellation list, but we will likely need to progress to Plan B.   Plan B : In order to have a quick treatment available I will place this patient on an auto titration CPAP between 5 and 18 cm water pressure with 3 cm EPR, heated humidification and an interface of his choice.  I want the patient to be vocal about any discomfort he may experience, difficulties with CPAP use or mask fit shall be addressed with the medical equipment company within 30 days of the device being issued. We will then follow up in our sleep clinic between 30 and 89 days of  CPAP therapy.       INTERPRETING  PHYSICIAN:  Melvyn Novas, MD   Board certified in Neurology and Sleep Medicine  by the ABSM and ABPN  [facility].

## 2022-07-25 NOTE — Addendum Note (Signed)
Addended by: Melvyn Novas on: 07/25/2022 08:26 PM   Modules accepted: Orders

## 2022-07-28 DIAGNOSIS — Z85828 Personal history of other malignant neoplasm of skin: Secondary | ICD-10-CM | POA: Diagnosis not present

## 2022-07-28 DIAGNOSIS — C44329 Squamous cell carcinoma of skin of other parts of face: Secondary | ICD-10-CM | POA: Diagnosis not present

## 2022-07-29 ENCOUNTER — Telehealth: Payer: Self-pay

## 2022-07-29 NOTE — Telephone Encounter (Signed)
I called pt. I advised pt that Dr. Vickey Huger reviewed their sleep study results and found that pt sever OSA. Dr. Dr. Vickey Huger recommends that pt start autopap. I reviewed PAP compliance expectations with the pt. Pt is agreeable to starting a CPAP. I advised pt that an order will be sent to a DME, [facility], and [facility] will call the pt within about one week after they file with the pt's insurance. [facility] will show the pt how to use the machine, fit for masks, and troubleshoot the CPAP if needed. A follow up appt was made for insurance purposes with Dr.Dohmeier on 6/20 830 . Pt verbalized understanding to arrive 15 minutes early and bring their CPAP. Pt verbalized understanding of results. Pt had no questions at this time but was encouraged to call back if questions arise. I have sent the order to [facility] and have received confirmation that they have received the order.

## 2022-07-29 NOTE — Telephone Encounter (Signed)
-----   Message from Melvyn Novas, MD sent at 07/25/2022  8:26 PM EDT ----- Complex sleep apnea- central sleep apnea is often undercounted by HST devices and this HST recorded 40% central apnea- likely there is more. Severe sleep apnea with bradycardia down to 30 bpm !!! Plan A ) in lab titration. Plan B) auto cpap.

## 2022-07-31 ENCOUNTER — Ambulatory Visit: Payer: PPO | Admitting: Internal Medicine

## 2022-07-31 ENCOUNTER — Encounter: Payer: Self-pay | Admitting: Internal Medicine

## 2022-07-31 VITALS — BP 120/70 | HR 64 | Ht 67.0 in | Wt 197.5 lb

## 2022-07-31 DIAGNOSIS — I4821 Permanent atrial fibrillation: Secondary | ICD-10-CM | POA: Diagnosis not present

## 2022-07-31 DIAGNOSIS — Z7901 Long term (current) use of anticoagulants: Secondary | ICD-10-CM

## 2022-07-31 DIAGNOSIS — Z86010 Personal history of colonic polyps: Secondary | ICD-10-CM

## 2022-07-31 MED ORDER — NA SULFATE-K SULFATE-MG SULF 17.5-3.13-1.6 GM/177ML PO SOLN: 1.0000 | Freq: Once | ORAL | 0 refills | Status: AC

## 2022-07-31 NOTE — Progress Notes (Signed)
HISTORY OF PRESENT ILLNESS:  Glenn [name] is a 82 y.o. 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  He is sent today by his primary care provider, Dr. Clelia Croft, regarding surveillance colonoscopy.  Despite his age, Glenn [name] is an excellent physical shape and quite active.  He did undergo complete colonoscopy April 06, 2012.  He was found to have multiple (5) and advanced (up to 15 mm) adenomatous polyps at that time.  Follow-up in 3 years recommended.  He was sent a recall letter at that time, but did not respond.  Has not been seen since.  He tells me that his GI review of systems is quite unremarkable.  No bleeding.  He has come off his anticoagulants in the past for surgeries.  He has not had prior stroke.  REVIEW OF SYSTEMS:  All non-GI ROS negative as otherwise stated in the HPI except for urinary leakage, lower extremity edema, hearing impairment, arthritis, shoulder pain  Past Medical History:  Diagnosis Date   Atrial fibrillation    takes aspirin for this   Brachial plexus injury 1995   Left arm   Dysrhythmia 2009   A-fib   HTN (hypertension)    6 years   Injury of brachial plexus    Prostate cancer    Prostate cancer    Torn rotator cuff    Torn rotator cuff 2000   Right arm    Past Surgical History:  Procedure Laterality Date   ETT  2006   High fitness but bp of 255   Int fixation left forearm  01/19/1989   KNEE SURGERY     left   MOHS SURGERY     PROSTATECTOMY     removal of basal cell     TONSILLECTOMY     TOTAL KNEE ARTHROPLASTY Right 08/09/2019   Procedure: TOTAL KNEE ARTHROPLASTY;  Surgeon: Durene Romans, MD;  Location: [facility];  Service: Orthopedics;  Laterality: Right;  70 mins    Social History Cleophas L Logsdon  reports that he quit smoking about 37 years ago. His smoking use included cigarettes. He has a 7.50 pack-year smoking history. He has never used smokeless  tobacco. He reports current alcohol use. He reports that he does not use drugs.  family history includes Heart attack in his father; Obesity in his brother; Parkinsonism in his brother; Prostate cancer in his brother.  No Known Allergies     PHYSICAL EXAMINATION: Vital signs: BP 120/70 (BP Location: Right Arm, Patient Position: Sitting, Cuff Size: Normal)   Pulse 64 Comment: irregular  Ht 5\' 7"  (1.702 m)   Wt 197 lb 8 oz (89.6 kg)   BMI 30.93 kg/m   Constitutional: generally well-appearing, no acute distress.  Bandage over right temple from recent skin cancer surgery Psychiatric: alert and oriented x3, cooperative Eyes: extraocular movements intact, anicteric, conjunctiva pink Mouth: oral pharynx moist, no lesions Neck: supple no lymphadenopathy Cardiovascular: Irregularly irregular rhythm with controlled rate, no murmur Lungs: clear to auscultation bilaterally Abdomen: soft, nontender, nondistended, no obvious ascites, no peritoneal signs, normal bowel sounds, no organomegaly Rectal: Deferred to colonoscopy Extremities: no clubbing or cyanosis.  Trace lower extremity edema bilaterally Skin: no lesions on visible extremities Neuro: No focal deficits.  Cranial nerves intact  ASSESSMENT:  1.  Personal history of multiple advanced adenomatous colon polyps on previous examination 2013.  Well overdue for surveillance. Physical condition given his age.  Motivated  to proceed after discussing this with his primary care provider 2.  Chronic atrial fibrillation on Eliquis 3.  Remote history of prostate cancer   PLAN:  1.  Surveillance colonoscopy.  The patient is HIGH RISK given his age and comorbidities as well as the need to address chronic anticoagulation therapy. 2.  He will hold Eliquis 2 days prior to the procedure.  We would anticipate resumption of Eliquis immediately post procedure pending the degree of therapeutic interventions, if any.The nature of the procedure, as well as  the risks, benefits, and alternatives were carefully and thoroughly reviewed with the patient. Ample time for discussion and questions allowed. The patient understood, was satisfied, and agreed to proceed. 3.  Ongoing general medical care with Dr. Clelia CroftShaw

## 2022-07-31 NOTE — Patient Instructions (Addendum)
You have been scheduled for a colonoscopy. Please follow written instructions given to you at your visit today.  Please pick up your prep supplies at the pharmacy within the next 1-3 days. If you use inhalers (even only as needed), please bring them with you on the day of your procedure.  PLEASE HOLD ELIQUIS 2 DAYS PER DR PERRY   _______________________________________________________  If your blood pressure at your visit was 140/90 or greater, please contact your primary care physician to follow up on this.  _______________________________________________________  If you are age 82 or older, your body mass index should be between 23-30. Your Body mass index is 30.93 kg/m. If this is out of the aforementioned range listed, please consider follow up with your Primary Care Provider.  If you are age 44 or younger, your body mass index should be between 19-25. Your Body mass index is 30.93 kg/m. If this is out of the aformentioned range listed, please consider follow up with your Primary Care Provider.   ________________________________________________________  The [facility] providers would like to encourage you to use [facility] to communicate with providers for non-urgent requests or questions.  Due to long hold times on the telephone, sending your provider a message by [facility] may be a faster and more efficient way to get a response.  Please allow 48 business hours for a response.  Please remember that this is for non-urgent requests.  _______________________________________________________ It was a pleasure to see you today!  Thank you for trusting me with your gastrointestinal care!

## 2022-08-14 ENCOUNTER — Ambulatory Visit
Admission: RE | Admit: 2022-08-14 | Discharge: 2022-08-14 | Disposition: A | Discharge status: Home / Self Care | Payer: PPO | Source: Ambulatory Visit | Attending: Internal Medicine | Admitting: Internal Medicine

## 2022-08-14 DIAGNOSIS — Z87891 Personal history of nicotine dependence: Secondary | ICD-10-CM | POA: Diagnosis not present

## 2022-09-01 ENCOUNTER — Encounter: Payer: Self-pay | Admitting: Internal Medicine

## 2022-09-04 DIAGNOSIS — G4733 Obstructive sleep apnea (adult) (pediatric): Secondary | ICD-10-CM | POA: Diagnosis not present

## 2022-09-04 DIAGNOSIS — G4737 Central sleep apnea in conditions classified elsewhere: Secondary | ICD-10-CM | POA: Diagnosis not present

## 2022-09-05 ENCOUNTER — Ambulatory Visit (INDEPENDENT_AMBULATORY_CARE_PROVIDER_SITE_OTHER): Payer: PPO | Admitting: Podiatry

## 2022-09-05 DIAGNOSIS — B351 Tinea unguium: Secondary | ICD-10-CM | POA: Diagnosis not present

## 2022-09-05 DIAGNOSIS — M79674 Pain in right toe(s): Secondary | ICD-10-CM

## 2022-09-05 DIAGNOSIS — M79675 Pain in left toe(s): Secondary | ICD-10-CM

## 2022-09-05 DIAGNOSIS — Q666 Other congenital valgus deformities of feet: Secondary | ICD-10-CM

## 2022-09-05 NOTE — Progress Notes (Signed)
  Subjective:  Patient ID: Glenn [name], male    DOB: [birth year],  MRN: [id number]  Chief Complaint  Patient presents with   Nail Problem    Nail trim    82 y.o. male returns for the above complaint.  Patient presents with thickened elongated dystrophic toenails x10.  Patient states painful to the patient diabetic he would like to have the nails debrided down he denies any other acute complaints.  Patient would also like to obtain orthotics.  He is flat-footed he states his arches hurt with bilateral foot pain he denies any other acute complaints  Objective:   There were no vitals filed for this visit.  Podiatric Exam: Vascular: dorsalis pedis and posterior tibial pulses are palpable bilateral. Capillary return is immediate. Temperature gradient is WNL. Skin turgor WNL  Sensorium: Normal Semmes Weinstein monofilament test. Normal tactile sensation bilaterally. Nail Exam: Pt has thick disfigured discolored nails with subungual debris noted bilateral entire nail hallux through fifth toenails.  Pain on palpation to the nails. Ulcer Exam: There is no evidence of ulcer or pre-ulcerative changes or infection. Orthopedic Exam: Muscle tone and strength are WNL. No limitations in general ROM. No crepitus or effusions noted.  Gait examination shows pes planovalgus with calcaneovalgus to many toe signs partially recruit the arch with dorsiflexion of the hallux Skin: No Porokeratosis. No infection or ulcers    Assessment & Plan:   1. Pes planovalgus     Patient was evaluated and treated and all questions answered.  Onychomycosis with pain  -Nails palliatively debrided as below. -Educated on self-care  Procedure: Nail Debridement Rationale: pain  Type of Debridement: manual, sharp debridement. Instrumentation: Nail nipper, rotary burr. Number of Nails: 10  Procedures and Treatment: Consent by patient was obtained for treatment procedures. The patient understood the discussion of treatment  and procedures well. All questions were answered thoroughly reviewed. Debridement of mycotic and hypertrophic toenails, 1 through 5 bilateral and clearing of subungual debris. No ulceration, no infection noted.  Return Visit-Office Procedure: Patient instructed to return to the office for a follow up visit 3 months for continued evaluation and treatment.  Pes planovalgus -I explained to patient the etiology of pes planovalgus and relationship with Planter fasciitis and various treatment options were discussed.  Given patient foot structure in the setting of Planter fasciitis I believe patient will benefit from custom-made orthotics to help control the hindfoot motion support the arch of the foot and take the stress away from plantar fascial.  Patient agrees with the plan like to proceed with orthotics -Patient was casted for orthotics   Nicholes Rough, DPM    No follow-ups on file.  Toenailx 10   Pesplanovalgus orthotics

## 2022-09-09 ENCOUNTER — Ambulatory Visit: Payer: PPO | Admitting: Internal Medicine

## 2022-09-09 ENCOUNTER — Telehealth: Payer: Self-pay | Admitting: *Deleted

## 2022-09-09 ENCOUNTER — Encounter: Payer: Self-pay | Admitting: Internal Medicine

## 2022-09-09 MED ORDER — NA SULFATE-K SULFATE-MG SULF 17.5-3.13-1.6 GM/177ML PO SOLN: 1.0000 | Freq: Once | ORAL | 0 refills | Status: AC

## 2022-09-09 MED ORDER — SODIUM CHLORIDE 0.9 % IV SOLN: 500.0000 mL | Freq: Once | INTRAVENOUS | Status: DC

## 2022-09-09 NOTE — Telephone Encounter (Signed)
Procedure rescheduled d/t pt taking Eliquis today. New prescription sent for Suprep to pt's pharmacy.

## 2022-09-10 DIAGNOSIS — M19012 Primary osteoarthritis, left shoulder: Secondary | ICD-10-CM | POA: Diagnosis not present

## 2022-09-10 DIAGNOSIS — M19011 Primary osteoarthritis, right shoulder: Secondary | ICD-10-CM | POA: Diagnosis not present

## 2022-09-23 DIAGNOSIS — Z85828 Personal history of other malignant neoplasm of skin: Secondary | ICD-10-CM | POA: Diagnosis not present

## 2022-09-23 DIAGNOSIS — I8312 Varicose veins of left lower extremity with inflammation: Secondary | ICD-10-CM | POA: Diagnosis not present

## 2022-09-23 DIAGNOSIS — I872 Venous insufficiency (chronic) (peripheral): Secondary | ICD-10-CM | POA: Diagnosis not present

## 2022-09-23 DIAGNOSIS — I8311 Varicose veins of right lower extremity with inflammation: Secondary | ICD-10-CM | POA: Diagnosis not present

## 2022-09-23 DIAGNOSIS — B356 Tinea cruris: Secondary | ICD-10-CM | POA: Diagnosis not present

## 2022-09-23 DIAGNOSIS — D1801 Hemangioma of skin and subcutaneous tissue: Secondary | ICD-10-CM | POA: Diagnosis not present

## 2022-09-23 DIAGNOSIS — D692 Other nonthrombocytopenic purpura: Secondary | ICD-10-CM | POA: Diagnosis not present

## 2022-09-23 DIAGNOSIS — Z85820 Personal history of malignant melanoma of skin: Secondary | ICD-10-CM | POA: Diagnosis not present

## 2022-09-23 DIAGNOSIS — L821 Other seborrheic keratosis: Secondary | ICD-10-CM | POA: Diagnosis not present

## 2022-09-23 DIAGNOSIS — D2372 Other benign neoplasm of skin of left lower limb, including hip: Secondary | ICD-10-CM | POA: Diagnosis not present

## 2022-10-05 DIAGNOSIS — G4733 Obstructive sleep apnea (adult) (pediatric): Secondary | ICD-10-CM | POA: Diagnosis not present

## 2022-10-05 DIAGNOSIS — G4737 Central sleep apnea in conditions classified elsewhere: Secondary | ICD-10-CM | POA: Diagnosis not present

## 2022-10-08 ENCOUNTER — Ambulatory Visit (AMBULATORY_SURGERY_CENTER): Payer: PPO | Admitting: Internal Medicine

## 2022-10-08 ENCOUNTER — Telehealth: Payer: Self-pay | Admitting: Internal Medicine

## 2022-10-08 ENCOUNTER — Encounter: Payer: Self-pay | Admitting: Internal Medicine

## 2022-10-08 VITALS — BP 113/72 | HR 66 | Temp 98.8°F | Resp 18 | Ht 67.0 in | Wt 197.0 lb

## 2022-10-08 DIAGNOSIS — Z86010 Personal history of colonic polyps: Secondary | ICD-10-CM | POA: Diagnosis not present

## 2022-10-08 DIAGNOSIS — D120 Benign neoplasm of cecum: Secondary | ICD-10-CM | POA: Diagnosis not present

## 2022-10-08 DIAGNOSIS — D122 Benign neoplasm of ascending colon: Secondary | ICD-10-CM | POA: Diagnosis not present

## 2022-10-08 DIAGNOSIS — K635 Polyp of colon: Secondary | ICD-10-CM | POA: Diagnosis not present

## 2022-10-08 DIAGNOSIS — D123 Benign neoplasm of transverse colon: Secondary | ICD-10-CM

## 2022-10-08 DIAGNOSIS — Z09 Encounter for follow-up examination after completed treatment for conditions other than malignant neoplasm: Secondary | ICD-10-CM | POA: Diagnosis not present

## 2022-10-08 HISTORY — PX: COLONOSCOPY: SHX174

## 2022-10-08 MED ORDER — SODIUM CHLORIDE 0.9 % IV SOLN
500.0000 mL | INTRAVENOUS | Status: DC
Start: 2022-10-08 — End: 2022-10-08

## 2022-10-08 NOTE — Patient Instructions (Addendum)
Recommendation:           - Repeat colonoscopy is not recommended for                            surveillance.                           - Resume Eliquis (apixaban) today at prior dose.                           - Patient has a contact number available for                            emergencies. The signs and symptoms of potential                            delayed complications were discussed with the                            patient. Return to normal activities tomorrow.                            Written discharge instructions were provided to the                            patient.                           - Resume previous diet.                           - Continue present medications.                           - Await pathology results.  Handout on polyps and diverticulosis given.  YOU HAD AN ENDOSCOPIC PROCEDURE TODAY AT THE [facility]:   Refer to the procedure report that was given to you for any specific questions about what was found during the examination.  If the procedure report does not answer your questions, please call your gastroenterologist to clarify.  If you requested that your care partner not be given the details of your procedure findings, then the procedure report has been included in a sealed envelope for you to review at your convenience later.  YOU SHOULD EXPECT: Some feelings of bloating in the abdomen. Passage of more gas than usual.  Walking can help get rid of the air that was put into your GI tract during the procedure and reduce the bloating. If you had a lower endoscopy (such as a colonoscopy or flexible sigmoidoscopy) you may notice spotting of blood in your stool or on the toilet paper. If you underwent a bowel prep for your procedure, you may not have a normal bowel movement for a few days.  Please Note:  You might notice some irritation and congestion in your nose or some drainage.  This is from the oxygen used during your procedure.  There is  no need for concern and it should clear up in a day or so.  SYMPTOMS TO REPORT IMMEDIATELY:  Following  lower endoscopy (colonoscopy or flexible sigmoidoscopy):  Excessive amounts of blood in the stool  Significant tenderness or worsening of abdominal pains  Swelling of the abdomen that is new, acute  Fever of 100F or higher  For urgent or emergent issues, a gastroenterologist can be reached at any hour by calling [phone]. Do not use MyChart messaging for urgent concerns.    DIET:  We do recommend a small meal at first, but then you may proceed to your regular diet.  Drink plenty of fluids but you should avoid alcoholic beverages for 24 hours.  ACTIVITY:  You should plan to take it easy for the rest of today and you should NOT DRIVE or use heavy machinery until tomorrow (because of the sedation medicines used during the test).    FOLLOW UP: Our staff will call the number listed on your records the next business day following your procedure.  We will call around 7:15- 8:00 am to check on you and address any questions or concerns that you may have regarding the information given to you following your procedure. If we do not reach you, we will leave a message.     If any biopsies were taken you will be contacted by phone or by letter within the next 1-3 weeks.  Please call us at [phone] if you have not heard about the biopsies in 3 weeks.    SIGNATURES/CONFIDENTIALITY: You and/or your care partner have signed paperwork which will be entered into your electronic medical record.  These signatures attest to the fact that that the information above on your After Visit Summary has been reviewed and is understood.  Full responsibility of the confidentiality of this discharge information lies with you and/or your care-partner.

## 2022-10-08 NOTE — Op Note (Signed)
[facility] Patient Name: Glenn [name] Procedure Date: 10/08/2022 1:31 PM MRN: [id number] Endoscopist: Wilhemina Bonito. Marina Goodell , MD, [id number] Age: 82 Referring MD:  Date of Birth: Sep 04, 1940 Gender: Male Account #: [id number] Procedure:                Colonoscopy Wth cold snare x 4, bx polypectomy x 1 Indications:              High risk colon cancer surveillance: Personal                            history of adenoma (10 mm or greater in size), High                            risk colon cancer surveillance: Personal history of                            multiple (3 or more) adenomas(2013) Medicines:                Monitored Anesthesia Care Procedure:                Pre-Anesthesia Assessment:                           - Prior to the procedure, a History and Physical                            was performed, and patient medications and                            allergies were reviewed. The patient's tolerance of                            previous anesthesia was also reviewed. The risks                            and benefits of the procedure and the sedation                            options and risks were discussed with the patient.                            All questions were answered, and informed consent                            was obtained. Prior Anticoagulants: The patient has                            taken Eliquis (apixaban), last dose was 3 days                            prior to procedure. ASA Grade Assessment: III - A                            patient with severe systemic disease. After  reviewing the risks and benefits, the patient was                            deemed in satisfactory condition to undergo the                            procedure.                           After obtaining informed consent, the colonoscope                            was passed under direct vision. Throughout the                            procedure, the  patient's blood pressure, pulse, and                            oxygen saturations were monitored continuously. The                            Olympus CF-HQ190L [id number]) Colonoscope was                            introduced through the anus and advanced to the the                            cecum, identified by appendiceal orifice and                            ileocecal valve. The ileocecal valve, appendiceal                            orifice, and rectum were photographed. The quality                            of the bowel preparation was excellent. The                            colonoscopy was performed without difficulty. The                            patient tolerated the procedure well. The bowel                            preparation used was SUPREP via split dose                            instruction. Scope In: 1:43:53 PM Scope Out: 2:02:38 PM Scope Withdrawal Time: 0 hours 14 minutes 52 seconds  Total Procedure Duration: 0 hours 18 minutes 45 seconds  Findings:                 A 1 mm polyp was found in the transverse colon. The  polyp was removed with a jumbo cold forceps.                            Resection and retrieval were complete.                           Four polyps were found in the ascending colon and                            cecum. The polyps were 3 to 5 mm in size. These                            polyps were removed with a cold snare. Resection                            and retrieval were complete.                           Multiple diverticula were found in the left colon                            and right colon.                           The exam was otherwise without abnormality on                            direct and retroflexion views. Complications:            No immediate complications. Estimated blood loss:                            None. Estimated Blood Loss:     Estimated blood loss: none. Impression:                - One 1 mm polyp in the transverse colon, removed                            with a jumbo cold forceps. Resected and retrieved.                           - Four 3 to 5 mm polyps in the ascending colon and                            in the cecum, removed with a cold snare. Resected                            and retrieved.                           - Diverticulosis in the left colon and in the right                            colon.                           -  The examination was otherwise normal on direct                            and retroflexion views. Recommendation:           - Repeat colonoscopy is not recommended for                            surveillance.                           - Resume Eliquis (apixaban) today at prior dose.                           - Patient has a contact number available for                            emergencies. The signs and symptoms of potential                            delayed complications were discussed with the                            patient. Return to normal activities tomorrow.                            Written discharge instructions were provided to the                            patient.                           - Resume previous diet.                           - Continue present medications.                           - Await pathology results. Wilhemina Bonito. Marina Goodell, MD 10/08/2022 2:12:49 PM This report has been signed electronically.

## 2022-10-08 NOTE — Progress Notes (Signed)
Vss nad trans to pacu 

## 2022-10-08 NOTE — Progress Notes (Signed)
Patient reports no recent health changes or medication changes

## 2022-10-08 NOTE — Telephone Encounter (Signed)
Colonoscopy w bx and cold snare today - says has temp 101  Had taken some acetamnophen prophylactically at about 5 PM  No pain, feels tired no resp sxs, IV site ok  Actually had BM - loose, small no blood  Advised rest, force fluids call back prn and that we would call back tomorrow

## 2022-10-08 NOTE — Progress Notes (Signed)
Called to room to assist during endoscopic procedure.  Patient ID and intended procedure confirmed with present staff. Received instructions for my participation in the procedure from the performing physician.  

## 2022-10-09 ENCOUNTER — Telehealth: Payer: Self-pay | Admitting: *Deleted

## 2022-10-09 ENCOUNTER — Encounter: Payer: Self-pay | Admitting: Neurology

## 2022-10-09 ENCOUNTER — Ambulatory Visit (INDEPENDENT_AMBULATORY_CARE_PROVIDER_SITE_OTHER): Payer: PPO | Admitting: Neurology

## 2022-10-09 VITALS — BP 121/73 | HR 64 | Ht 70.0 in | Wt 194.0 lb

## 2022-10-09 DIAGNOSIS — G4739 Other sleep apnea: Secondary | ICD-10-CM | POA: Diagnosis not present

## 2022-10-09 DIAGNOSIS — I4821 Permanent atrial fibrillation: Secondary | ICD-10-CM

## 2022-10-09 DIAGNOSIS — G4731 Primary central sleep apnea: Secondary | ICD-10-CM

## 2022-10-09 NOTE — Patient Instructions (Signed)
82  year old male here with: atrial fibrillation/ persistent,  and COMPLEX sleep apnea by HST - 40% central apnea, overall moderate-severe to severe apnea.    PS : This patient is not a candidate for inspire implant.    Treatment emergent Central Sleep Apnea, now dominating his residual apneas and overall insufficient control of apnea. He will need to return for an in lab titration to biPAP or ST, or even ASV.  He is aware of the waiting time for this in lab test and it will likely be September .    I plan to follow up either personally or through our NP within 3-4 months after titration study.    I would like to thank Cleatis Polka., MD [address] [place],  [place] [address] for allowing me to meet with and to take care of this pleasant patient.    CC: I will share my notes with PCP and cardiologist.   After spending a total time of  23  minutes face to face and additional time for physical and neurologic examination, review of laboratory studies,  personal review of imaging studies, reports and results of other testing and review of referral information / records as far as provided in visit,    Electronically signed by: Melvyn Novas, MD 10/09/2022 8:38 AM

## 2022-10-09 NOTE — Progress Notes (Signed)
Provider:  Melvyn Novas, MD  Primary Care Physician:  Cleatis Polka., MD [address] [place] [address]     Referring Provider: Cleatis Polka., Md [address] [place],  [place] [address]          Chief Complaint according to patient   Patient presents with:     New Patient (Initial Visit)      Here today for initial cpap. He feels he has gotten used to it but is not feeling as he would hope from using it. DME [facility] .      HISTORY OF PRESENT ILLNESS:   10-09-2022: Glenn [name] is a 82 y.o. male patient who is here for revisit 10/09/2022 for follow up on newly diagnosed OSA by HST and his start on CPAP therapy.  The patient underwent a home sleep test on 07-24-2022, his Epworth sleepiness score had been endorsed at 7 in the fatigue severity at 24 prior to the study.  He was diagnosed with actually fairly severe obstructive sleep apnea his AHI was 35.1 not REM sleep dependent and was equally expressed in supine and in left lateral sleep position.  Snoring was not a main finding of the study.  There was no clinically significant sleep hypoxia.  He did use auto PAP , compliance data: DME [facility], now adapt- nasal mask was given to him.  His sleep apnea test confirmed that 40% of central apneas were present and therefore his diagnosis is not obstructive sleep apnea per se but complex sleep apnea.  With the onset of AutoSet CPAP titration between 5 and 16 cmH2O the send 2 cm expiratory relief the patient is highly compliant 87% by days and 83% by hours but he finds that central apnea is now the dominant factor 12/h obstructive 0.4/h residual AHI 16.7/h.  The 95th percentile pressure is well-managed here 11 cm water but the air leak is high.  There are 2 steps to be taken #1 to tighten the mask at home and may be even trying a different model or different size but #2 I feel that the CPAP alone with the central apneas arising under CPAP will not be the best treatment for  him.  He does not feel that he wakes up more refreshed or restored and this patient should be titrated in an in lab sleep study later this summer.     He reports shoulder pain keeping him in supine sleep position.  Glenn [name] is a 82 y.o. male patient who is seen upon referral by PCP Dr Clelia Croft- Consultation on 06/19/2022  Chief concern according to patient :  Halton L Kaczorowski reported on 06/19/22 :  I never had a problem going to sleep, but I feel  my sleep is less restful, shoulder pain is a factor, I can't sleep on my left arm. My orthopedist recommends a replacement. I still play tennis and gold and feel tired after that, mid-afternoon drowsiness. "  "I drive to our summer home in [place] and can do that in one day"     The patient never had a sleep study . He is followed for atrial fibrillation.   Sleep relevant medical history: a fib. Nocturia: none , in spite of total prostate ectomy at [facility] in 1995, some incontinence is present. Sleep walking;none. Had a Tonsillectomy in childhood, cervical spine - neck pain, shoulder pain.  Family medical /sleep history: no other biological family member  on CPAP with OSA, insomnia, sleep walkers.    Social history: Patient is working as part time/ retired from Research officer, political party and lives in a household with spouse,   Family status is married, with 3 adult children, 7 grandchildren, 6 living.  The patient currently works part-timer Tobacco use; quit 1987.   ETOH use ; socially- WE 3-4 drinks per week   Caffeine intake in form of Coffee( 1-2 cups in AM ) Soda( /) Tea ( iced- when eating out ) on energy drinks. Exercise in form of golf, tennis.   Hobbies :traveling, water sports , Engineer, water.     Review of Systems: Out of a complete 14 system review, the patient complains of only the following symptoms, and all other reviewed systems are negative.:   Shoulder pain , affecting sleep position.   How likely are you to doze in the following  situations: 0 = not likely, 1 = slight chance, 2 = moderate chance, 3 = high chance   Sitting and Reading? Watching Television? Sitting inactive in a public place (theater or meeting)? As a passenger in a car for an hour without a break? Lying down in the afternoon when circumstances permit? Sitting and talking to someone? Sitting quietly after lunch without alcohol? In a car, while stopped for a few minutes in traffic?   Total = 6/ 24 points   FSS endorsed at 18/ 63 points.   Social History   Socioeconomic History   Marital status: Married    Spouse name: Not on file   Number of children: 3   Years of education: Not on file   Highest education level: Not on file  Occupational History   Occupation: Engineer, drilling: Retired  Tobacco Use   Smoking status: Former    Packs/day: 0.50    Years: 15.00    Additional pack years: 0.00    Total pack years: 7.50    Types: Cigarettes    Quit date: 04/21/1985    Years since quitting: 37.4   Smokeless tobacco: Never  Vaping Use   Vaping Use: Never used  Substance and Sexual Activity   Alcohol use: Yes    Comment: Social   Drug use: No   Sexual activity: Not on file  Other Topics Concern   Not on file  Social History Narrative   works as Veterinary surgeon;    active Media planner played BB at [employer] graduated 1964 after 2 final fours   married to [place]; 2 male and 1 male children;    multiple grandkids;    non smoker - quit 25 years ago   social ETOH   Social Determinants of Health   Financial Resource Strain: Not on file  Food Insecurity: Not on file  Transportation Needs: Not on file  Physical Activity: Not on file  Stress: Not on file  Social Connections: Not on file    Family History  Problem Relation Age of Onset   Heart attack Father        MI   Obesity Brother    Parkinsonism Brother    Prostate cancer Brother    Colon cancer Neg Hx    Esophageal cancer Neg Hx    Stomach cancer Neg Hx    Rectal cancer Neg Hx      Past Medical History:  Diagnosis Date   Atrial fibrillation (HCC)    takes aspirin for this   Brachial plexus injury 1995   Left arm   Dysrhythmia 2009  A-fib   HTN (hypertension)    6 years   Injury of brachial plexus    Prostate cancer [facility])    Prostate cancer (HCC)    Torn rotator cuff    Torn rotator cuff 2000   Right arm    Past Surgical History:  Procedure Laterality Date   ETT  2006   High fitness but bp of 255   Int fixation left forearm  01/19/1989   KNEE SURGERY     left   MOHS SURGERY     PROSTATECTOMY     removal of basal cell     TONSILLECTOMY     TOTAL KNEE ARTHROPLASTY Right 08/09/2019   Procedure: TOTAL KNEE ARTHROPLASTY;  Surgeon: Durene Romans, MD;  Location: [facility];  Service: Orthopedics;  Laterality: Right;  70 mins     Current Outpatient Medications on File Prior to Visit  Medication Sig Dispense Refill   amLODipine (NORVASC) 5 MG tablet Take 1 tablet (5 mg total) by mouth daily. 90 tablet 3   amoxicillin (AMOXIL) 500 MG capsule Take 2,000 mg by mouth See admin instructions. Take 4 capsules (2000 mg) by mouth 1 hour prior to dental procedures.     apixaban (ELIQUIS) 5 MG TABS tablet Take 1 tablet (5 mg total) by mouth 2 (two) times daily. 180 tablet 3   atorvastatin (LIPITOR) 10 MG tablet Take 1 tablet (10 mg total) by mouth daily. 90 tablet 3   hydrochlorothiazide (HYDRODIURIL) 25 MG tablet Take 1 tablet (25 mg total) by mouth daily. 90 tablet 3   lisinopril (ZESTRIL) 40 MG tablet Take 1 tablet (40 mg total) by mouth daily. 90 tablet 3   tetrahydrozoline 0.05 % ophthalmic solution Place 1-2 drops into both eyes daily as needed (dry/irritated eyes.).     Current Facility-Administered Medications on File Prior to Visit  Medication Dose Route Frequency Provider Last Rate Last Admin   0.9 %  sodium chloride infusion  500 mL Intravenous Once Hilarie Fredrickson, MD        No Known Allergies   DIAGNOSTIC DATA (LABS, IMAGING, TESTING) - I reviewed  patient records, labs, notes, testing and imaging myself where available.  Lab Results  Component Value Date   WBC 20.1 (H) 08/10/2019   HGB 13.0 08/10/2019   HCT 39.4 08/10/2019   MCV 95.4 08/10/2019   PLT 205 08/10/2019      Component Value Date/Time   NA 135 08/10/2019 0323   NA 141 08/04/2017 1047   K 3.9 08/10/2019 0323   CL 104 08/10/2019 0323   CO2 24 08/10/2019 0323   GLUCOSE 182 (H) 08/10/2019 0323   BUN 26 (H) 08/10/2019 0323   BUN 19 08/04/2017 1047   CREATININE 1.18 08/10/2019 0323   CALCIUM 8.1 (L) 08/10/2019 0323   PROT 6.7 07/29/2019 1337   PROT 6.4 07/08/2016 0842   ALBUMIN 3.8 07/29/2019 1337   ALBUMIN 4.0 07/08/2016 0842   AST 24 07/29/2019 1337   ALT 23 07/29/2019 1337   ALKPHOS 66 07/29/2019 1337   BILITOT 1.0 07/29/2019 1337   BILITOT 0.7 07/08/2016 0842   GFRNONAA 59 (L) 08/10/2019 0323   GFRAA >60 08/10/2019 0323   Lab Results  Component Value Date   CHOL 170 07/08/2016   HDL 61 07/08/2016   LDLCALC 101 (H) 07/08/2016   TRIG 42 07/08/2016   CHOLHDL 2.8 07/08/2016   Lab Results  Component Value Date   HGBA1C 6.0 (H) 08/09/2019   No results found for: "VITAMINB12"  No results found for: "TSH"  PHYSICAL EXAM:  Today's Vitals   10/09/22 0833  BP: 121/73  Pulse: 64  Weight: 194 lb (88 kg)  Height: 5\' 10"  (1.778 m)   Body mass index is 27.84 kg/m.   Wt Readings from Last 3 Encounters:  10/09/22 194 lb (88 kg)  10/08/22 197 lb (89.4 kg)  07/31/22 197 lb 8 oz (89.6 kg)     Ht Readings from Last 3 Encounters:  10/09/22 5\' 10"  (1.778 m)  10/08/22 5\' 7"  (1.702 m)  07/31/22 5\' 7"  (1.702 m)      General:  The patient is awake, alert and appears not in acute distress. The patient is well groomed. Head: Normocephalic, atraumatic. Neck is supple.  Mallampati 2,  neck circumference:18.25 inches . Nasal airflow  patent.  Retrognathia is not seen.  Dental status: partial  Cardiovascular:  Regular rate and cardiac rhythm by pulse,   without distended neck veins. Respiratory: Lungs are clear to auscultation.  Skin:  With evidence of ankle edema. Trunk: The patient's posture is erect.   NEUROLOGIC EXAM: The patient is awake and alert, oriented to place and time.   Memory subjective described as intact.  Attention span & concentration ability appears normal.  Speech is fluent,  without dysarthria, dysphonia or aphasia.  Mood and affect are appropriate.   Cranial nerves: no loss of smell or taste reported  Pupils are equal and briskly reactive to light. Funduscopic exam deferred.  Extraocular movements in vertical and horizontal planes were intact and without nystagmus.  No Diplopia. Visual fields by finger perimetry are intact. Hearing was impaired- bilaterally     Facial sensation intact to fine touch.  Facial motor strength is symmetric and tongue and uvula move midline.  Neck ROM : rotation, tilt and flexion extension were normal for age and shoulder shrug was symmetrical.    Motor exam:  Symmetric bulk, tone and ROM.   Elevated base tone without cog -wheeling, protective ? There is left lesser  grip strength .   Sensory:  Fine touch,  and vibration were felt reduced at either ankle. Proprioception tested in the upper extremities was normal.   Coordination: Rapid alternating movements in the fingers/hands were of normal speed.  The Finger-to-nose maneuver was intact without evidence of ataxia, dysmetria or tremor.   Gait and station: Patient could rise unassisted from a seated position, walked without assistive device.  Stance is of normal width/ base .  Toe and heel walk were deferred.  Deep tendon reflexes: in the  upper extremities are symmetric and intact.  Patella is reduced, status post total knee.  Babinski response was deferred.  ASSESSMENT AND PLAN 83  year old male  here with: atrial fibrillation, persistent and COMPLEX sleep apnea by HST - 40% central apnea, moderate-severe.   This patient is  not a candidate for inspire implant.   Treatment emergent Central Sleep Apnea, now dominating his residual apneas and overall insufficient control of apnea. He will need to return for an in lab titration to biPAP or ST, or even ASV.  He is aware of the waiting time for this in lab test and it will likely be September .    I plan to follow up either personally or through our NP within 3-4 months after titration study.   I would like to thank Cleatis Polka., MD [address] [place],  [place] [address] for allowing me to meet with and to take care of this pleasant  patient.   CC: I will share my notes with PCP and cardiologist.  After spending a total time of  23  minutes face to face and additional time for physical and neurologic examination, review of laboratory studies,  personal review of imaging studies, reports and results of other testing and review of referral information / records as far as provided in visit,   Electronically signed by: Melvyn Novas, MD 10/09/2022 8:38 AM  [facility] Sleep Board certified by The [employer] of Sleep Medicine and Diplomate of the [employer] of Sleep Medicine. Board certified In Neurology through the ABPN, Fellow of the [employer] of Neurology.

## 2022-10-09 NOTE — Telephone Encounter (Signed)
Thank you both!

## 2022-10-09 NOTE — Telephone Encounter (Signed)
  Follow up Call-     10/08/2022    1:14 PM  Call back number  Post procedure Call Back phone  # [phone]  Permission to leave phone message Yes     Patient questions:  Do you have a fever, pain , or abdominal swelling? No. Pain Score  0 *  Have you tolerated food without any problems? Yes.    Have you been able to return to your normal activities? Yes.    Do you have any questions about your discharge instructions: Diet   No. Medications  No. Follow up visit  No.  Do you have questions or concerns about your Care? No.  Actions: * If pain score is 4 or above: No action needed, pain <4.

## 2022-10-09 NOTE — Telephone Encounter (Signed)
Spoke with pt and he has not had any more fever and states he feels good today.

## 2022-10-13 ENCOUNTER — Encounter: Payer: Self-pay | Admitting: Internal Medicine

## 2022-10-21 ENCOUNTER — Telehealth: Payer: Self-pay | Admitting: Neurology

## 2022-10-21 NOTE — Telephone Encounter (Signed)
HTA pending faxed notes 

## 2022-10-22 DIAGNOSIS — M25512 Pain in left shoulder: Secondary | ICD-10-CM | POA: Diagnosis not present

## 2022-10-22 DIAGNOSIS — M25511 Pain in right shoulder: Secondary | ICD-10-CM | POA: Diagnosis not present

## 2022-10-28 DIAGNOSIS — M25512 Pain in left shoulder: Secondary | ICD-10-CM | POA: Diagnosis not present

## 2022-10-28 DIAGNOSIS — M25511 Pain in right shoulder: Secondary | ICD-10-CM | POA: Diagnosis not present

## 2022-11-04 DIAGNOSIS — G4733 Obstructive sleep apnea (adult) (pediatric): Secondary | ICD-10-CM | POA: Diagnosis not present

## 2022-11-04 DIAGNOSIS — G4737 Central sleep apnea in conditions classified elsewhere: Secondary | ICD-10-CM | POA: Diagnosis not present

## 2022-11-11 ENCOUNTER — Other Ambulatory Visit: Payer: Self-pay | Admitting: Physician Assistant

## 2022-11-11 DIAGNOSIS — I4821 Permanent atrial fibrillation: Secondary | ICD-10-CM

## 2022-11-11 NOTE — Telephone Encounter (Signed)
Eliquis 5mg  refill request received. Patient is 82 years old, weight-88kg, Crea- 1.11 on 03/19/22 via [facility] from [place], [place], and last seen by Francis Dowse on 06/17/22. Dose is appropriate based on dosing criteria. Will send in refill to requested pharmacy.

## 2022-11-17 NOTE — Telephone Encounter (Signed)
Bipap is scheduled for 01/27/23 at 9 pm.  HTA auth: [id number] (Exp. 10/21/22 to 01/19/23)   It will expire before the patient has his study I will redo the auth   Mailed the patient to the patient.

## 2022-11-25 DIAGNOSIS — M25511 Pain in right shoulder: Secondary | ICD-10-CM | POA: Diagnosis not present

## 2022-11-25 DIAGNOSIS — M25512 Pain in left shoulder: Secondary | ICD-10-CM | POA: Diagnosis not present

## 2022-11-27 DIAGNOSIS — M25512 Pain in left shoulder: Secondary | ICD-10-CM | POA: Diagnosis not present

## 2022-11-27 DIAGNOSIS — M25511 Pain in right shoulder: Secondary | ICD-10-CM | POA: Diagnosis not present

## 2022-12-02 DIAGNOSIS — M25511 Pain in right shoulder: Secondary | ICD-10-CM | POA: Diagnosis not present

## 2022-12-02 DIAGNOSIS — M25512 Pain in left shoulder: Secondary | ICD-10-CM | POA: Diagnosis not present

## 2022-12-04 ENCOUNTER — Telehealth: Payer: Self-pay | Admitting: Internal Medicine

## 2022-12-04 DIAGNOSIS — M25512 Pain in left shoulder: Secondary | ICD-10-CM | POA: Diagnosis not present

## 2022-12-04 DIAGNOSIS — M25511 Pain in right shoulder: Secondary | ICD-10-CM | POA: Diagnosis not present

## 2022-12-04 NOTE — Telephone Encounter (Signed)
   Pre-operative Risk Assessment    Patient Name: Glenn [name]  DOB: January 26, 1941 MRN: [id number]      Request for Surgical Clearance    Procedure:   Left reverse total shoulder arthoplasty   Date of Surgery:  Clearance TBD                                 Surgeon:  Dr. Ave Filter  Surgeon's Group or Practice Name:  [facility]  Phone number:  [phone] Fax number:  [phone]    Type of Clearance Requested:   - Medical  - Pharmacy:  Hold Apixaban (Eliquis)     Type of Anesthesia:   choice    Additional requests/questions:   Darel Hong stated this is an URGENT request.   Alben Spittle   12/04/2022, 11:49 AM

## 2022-12-05 DIAGNOSIS — G4733 Obstructive sleep apnea (adult) (pediatric): Secondary | ICD-10-CM | POA: Diagnosis not present

## 2022-12-05 DIAGNOSIS — G4737 Central sleep apnea in conditions classified elsewhere: Secondary | ICD-10-CM | POA: Diagnosis not present

## 2022-12-05 NOTE — Telephone Encounter (Signed)
In further review of the chart. Pt has appt 12/15/22 with Dr. Ladona Ridgel. Per pre op APP Edd Fabian, FNP, ok to defer clearance to appt with MD. Pt will need CBC/BMET as well for pre op clearance per the pre op pharm-d.    If tele done also this will cause the pt to be billed for 2 appts so close together, to avoid the double billing defer clearance to Dr. Ladona Ridgel appt.    I will update all parties involved.

## 2022-12-05 NOTE — Telephone Encounter (Signed)
I tried to call the pt. Vm came on and then hung up. Pt is going to need lab work and then will need a tele appt a few days after labs have been done.

## 2022-12-05 NOTE — Telephone Encounter (Signed)
Preoperative team, patient does not have updated lab work.  Please order CBC and BMP.  Once lab work has been completed pharmacy will be able to provide recommendations for upcoming surgery.  Thank you for your help.  Thomasene Ripple. Tondra Reierson NP-C     12/05/2022, 8:04 AM [facility] [address] Office [phone] Fax [phone]

## 2022-12-05 NOTE — Telephone Encounter (Signed)
Patient with diagnosis of atrial fibrillation on Eliquis for anticoagulation.    Procedure:   Left reverse total shoulder arthoplasty    Date of Surgery:  Clearance TBD     CHA2DS2-VASc Score = 3   This indicates a 3.2% annual risk of stroke. The patient's score is based upon: CHF History: 0 HTN History: 1 Diabetes History: 0 Stroke History: 0 Vascular Disease History: 0 Age Score: 2 Gender Score: 0    CrCl 67 Platelet count- last drawn 07/2019  Need updated platelet count for final clearance.  If WNL, then:   Per office protocol, patient can hold Eliquis for 3 days prior to procedure.   Patient will not need bridging with Lovenox (enoxaparin) around procedure.  **This guidance is not considered finalized until pre-operative APP has relayed final recommendations.**

## 2022-12-07 ENCOUNTER — Other Ambulatory Visit: Payer: Self-pay | Admitting: Physician Assistant

## 2022-12-08 NOTE — Telephone Encounter (Signed)
I'll see 8/26

## 2022-12-09 DIAGNOSIS — M25511 Pain in right shoulder: Secondary | ICD-10-CM | POA: Diagnosis not present

## 2022-12-09 DIAGNOSIS — M25512 Pain in left shoulder: Secondary | ICD-10-CM | POA: Diagnosis not present

## 2022-12-09 NOTE — Telephone Encounter (Signed)
I will update the surgeon office Dr. Ladona Ridgel will see the pt on 12/15/22.

## 2022-12-11 DIAGNOSIS — M25512 Pain in left shoulder: Secondary | ICD-10-CM | POA: Diagnosis not present

## 2022-12-11 DIAGNOSIS — M25511 Pain in right shoulder: Secondary | ICD-10-CM | POA: Diagnosis not present

## 2022-12-15 ENCOUNTER — Ambulatory Visit: Payer: PPO | Admitting: Internal Medicine

## 2022-12-15 ENCOUNTER — Encounter: Payer: Self-pay | Admitting: Internal Medicine

## 2022-12-15 VITALS — BP 150/82 | HR 65 | Ht 70.0 in | Wt 196.0 lb

## 2022-12-15 DIAGNOSIS — I4821 Permanent atrial fibrillation: Secondary | ICD-10-CM

## 2022-12-15 NOTE — Progress Notes (Signed)
HPI Glenn [name] returns today for followup of his atrial fib and HTN. He is a pleasant 82 yo man with longstanding atrial fib, rate controlled and chronic anti-coagulation. He remains active exercising. He has dyslipidemia and is on lipitor. Since his last visit, he has undergone colonoscopy which was notable for a few small polyps and diverticulosis. He notes some slowing with age but denies other symptoms.   No Known Allergies   Current Outpatient Medications  Medication Sig Dispense Refill   amLODipine (NORVASC) 5 MG tablet Take 1 tablet (5 mg total) by mouth daily. 90 tablet 3   amoxicillin (AMOXIL) 500 MG capsule Take 2,000 mg by mouth See admin instructions. Take 4 capsules (2000 mg) by mouth 1 hour prior to dental procedures.     apixaban (ELIQUIS) 5 MG TABS tablet TAKE 1 TABLET BY MOUTH TWICE A DAY 180 tablet 1   atorvastatin (LIPITOR) 10 MG tablet Take 1 tablet (10 mg total) by mouth daily. 90 tablet 3   hydrochlorothiazide (HYDRODIURIL) 25 MG tablet TAKE 1 TABLET BY MOUTH DAILY 90 tablet 3   lisinopril (ZESTRIL) 40 MG tablet Take 1 tablet (40 mg total) by mouth daily. 90 tablet 3   tetrahydrozoline 0.05 % ophthalmic solution Place 1-2 drops into both eyes daily as needed (dry/irritated eyes.).     Current Facility-Administered Medications  Medication Dose Route Frequency Provider Last Rate Last Admin   0.9 %  sodium chloride infusion  500 mL Intravenous Once Hilarie Fredrickson, MD         Past Medical History:  Diagnosis Date   Atrial fibrillation [facility])    takes aspirin for this   Brachial plexus injury 1995   Left arm   Dysrhythmia 2009   A-fib   HTN (hypertension)    6 years   Injury of brachial plexus    Prostate cancer (HCC)    Prostate cancer (HCC)    Torn rotator cuff    Torn rotator cuff 2000   Right arm    ROS:   All systems reviewed and negative except as noted in the HPI.   Past Surgical History:  Procedure Laterality Date   ETT  2006   High  fitness but bp of 255   Int fixation left forearm  01/19/1989   KNEE SURGERY     left   MOHS SURGERY     PROSTATECTOMY     removal of basal cell     TONSILLECTOMY     TOTAL KNEE ARTHROPLASTY Right 08/09/2019   Procedure: TOTAL KNEE ARTHROPLASTY;  Surgeon: Durene Romans, MD;  Location: [facility];  Service: Orthopedics;  Laterality: Right;  70 mins     Family History  Problem Relation Age of Onset   Heart attack Father        MI   Obesity Brother    Parkinsonism Brother    Prostate cancer Brother    Colon cancer Neg Hx    Esophageal cancer Neg Hx    Stomach cancer Neg Hx    Rectal cancer Neg Hx      Social History   Socioeconomic History   Marital status: Married    Spouse name: Not on file   Number of children: 3   Years of education: Not on file   Highest education level: Not on file  Occupational History   Occupation: Engineer, drilling: Retired  Tobacco Use   Smoking status: Former    Current packs/day: 0.00  Average packs/day: 0.5 packs/day for 15.0 years (7.5 ttl pk-yrs)    Types: Cigarettes    Start date: 04/21/1970    Quit date: 04/21/1985    Years since quitting: 37.6   Smokeless tobacco: Never  Vaping Use   Vaping status: Never Used  Substance and Sexual Activity   Alcohol use: Yes    Comment: Social   Drug use: No   Sexual activity: Not on file  Other Topics Concern   Not on file  Social History Narrative   works as Veterinary surgeon;    active Media planner played BB at [employer] graduated 1964 after 2 final fours   married to [place]; 2 male and 1 male children;    multiple grandkids;    non smoker - quit 25 years ago   social ETOH   Social Determinants of Health   Financial Resource Strain: Not on file  Food Insecurity: Not on file  Transportation Needs: Not on file  Physical Activity: Not on file  Stress: Not on file  Social Connections: Not on file  Intimate Partner Violence: Not on file     BP (!) 150/82   Pulse 65   Ht 5\' 10"  (1.778 m)   Wt  196 lb (88.9 kg)   SpO2 97%   BMI 28.12 kg/m   Physical Exam:  Well appearing NAD HEENT: Unremarkable Neck:  No JVD, no thyromegally Lymphatics:  No adenopathy Back:  No CVA tenderness Lungs:  Clear with no wheezes HEART:  IRegular rate rhythm, no murmurs, no rubs, no clicks Abd:  soft, positive bowel sounds, no organomegally, no rebound, no guarding Ext:  2 plus pulses, no edema, no cyanosis, no clubbing Skin:  No rashes no nodules Neuro:  CN II through XII intact, motor grossly intact  EKG - atrial fib with a controlled VR  DEVICE  Normal device function.  See PaceArt for details.   Assess/Plan: Atrial fib - his VR is well controlled. No change in his meds. Coags - he will continue eliquis. He has not had any bleeding. HTN - his bp is fairly well controlled at home though a little high in the office today.  Sharlot Gowda Glenn Crays,MD

## 2022-12-15 NOTE — Patient Instructions (Addendum)
Medication Instructions:  Your physician recommends that you continue on your current medications as directed. Please refer to the Current Medication list given to you today.  *If you need a refill on your cardiac medications before your next appointment, please call your pharmacy*  Lab Work: None  If you have labs (blood work) drawn today and your tests are completely normal, you will receive your results only by: MyChart Message (if you have MyChart) OR A paper copy in the mail If you have any lab test that is abnormal or we need to change your treatment, we will call you to review the results.  Testing/Procedures: None ordered.  Follow-Up: At [facility], you and your health needs are our priority.  As part of our continuing mission to provide you with exceptional heart care, we have created designated Provider Care Teams.  These Care Teams include your primary Cardiologist (physician) and Advanced Practice Providers (APPs -  Physician Assistants and Nurse Practitioners) who all work together to provide you with the care you need, when you need it.   Your next appointment:   1 year(s)  The format for your next appointment:   In Person  Provider:   Lewayne Bunting, MD{or one of the following Advanced Practice Providers on your designated Care Team:   Francis Dowse, [place] Casimiro Needle "Mardelle Matte" [place], [place] Earnest Rosier, NP   Important Information About Sugar

## 2022-12-16 DIAGNOSIS — M25511 Pain in right shoulder: Secondary | ICD-10-CM | POA: Diagnosis not present

## 2022-12-16 DIAGNOSIS — M25512 Pain in left shoulder: Secondary | ICD-10-CM | POA: Diagnosis not present

## 2022-12-18 DIAGNOSIS — M25511 Pain in right shoulder: Secondary | ICD-10-CM | POA: Diagnosis not present

## 2022-12-18 DIAGNOSIS — M25512 Pain in left shoulder: Secondary | ICD-10-CM | POA: Diagnosis not present

## 2022-12-18 NOTE — Telephone Encounter (Signed)
I started new case for authorization for his Sleep study that is scheduled for 01/27/23.

## 2022-12-23 DIAGNOSIS — M25512 Pain in left shoulder: Secondary | ICD-10-CM | POA: Diagnosis not present

## 2022-12-23 DIAGNOSIS — M25511 Pain in right shoulder: Secondary | ICD-10-CM | POA: Diagnosis not present

## 2022-12-24 ENCOUNTER — Ambulatory Visit: Payer: PPO | Admitting: Podiatry

## 2022-12-24 NOTE — Telephone Encounter (Signed)
I called HTA to check the status it is still pending. The turn around time is 01/01/23.

## 2022-12-25 DIAGNOSIS — M25511 Pain in right shoulder: Secondary | ICD-10-CM | POA: Diagnosis not present

## 2022-12-25 DIAGNOSIS — M25512 Pain in left shoulder: Secondary | ICD-10-CM | POA: Diagnosis not present

## 2022-12-26 ENCOUNTER — Encounter: Payer: Self-pay | Admitting: Podiatry

## 2022-12-26 ENCOUNTER — Ambulatory Visit: Payer: PPO | Admitting: Podiatry

## 2022-12-26 DIAGNOSIS — Q666 Other congenital valgus deformities of feet: Secondary | ICD-10-CM

## 2022-12-26 NOTE — Progress Notes (Signed)
  Subjective:  Patient ID: Glenn [name], male    DOB: Jun 02, 1940,  MRN: [id number]  Chief Complaint  Patient presents with   Flat Foot    Pt stated that his feet are doing better    82 y.o. male returns for the above complaint.  Patient presents with thickened elongated dystrophic toenails x10.  Patient states painful to the patient diabetic he would like to have the nails debrided down he denies any other acute complaints.  Patient would also like to obtain orthotics.  He is flat-footed he states his arches hurt with bilateral foot pain he denies any other acute complaints  Objective:   There were no vitals filed for this visit.  Podiatric Exam: Vascular: dorsalis pedis and posterior tibial pulses are palpable bilateral. Capillary return is immediate. Temperature gradient is WNL. Skin turgor WNL  Sensorium: Normal Semmes Weinstein monofilament test. Normal tactile sensation bilaterally. Nail Exam: Pt has thick disfigured discolored nails with subungual debris noted bilateral entire nail hallux through fifth toenails.  Pain on palpation to the nails. Ulcer Exam: There is no evidence of ulcer or pre-ulcerative changes or infection. Orthopedic Exam: Muscle tone and strength are WNL. No limitations in general ROM. No crepitus or effusions noted.  Gait examination shows pes planovalgus with calcaneovalgus to many toe signs partially recruit the arch with dorsiflexion of the hallux Skin: No Porokeratosis. No infection or ulcers    Assessment & Plan:   No diagnosis found.   Patient was evaluated and treated and all questions answered.  Onychomycosis with pain  -Nails palliatively debrided as below. -Educated on self-care  Procedure: Nail Debridement Rationale: pain  Type of Debridement: manual, sharp debridement. Instrumentation: Nail nipper, rotary burr. Number of Nails: 10  Procedures and Treatment: Consent by patient was obtained for treatment procedures. The patient understood the  discussion of treatment and procedures well. All questions were answered thoroughly reviewed. Debridement of mycotic and hypertrophic toenails, 1 through 5 bilateral and clearing of subungual debris. No ulceration, no infection noted.  Return Visit-Office Procedure: Patient instructed to return to the office for a follow up visit 3 months for continued evaluation and treatment.  Pes planovalgus -I explained to patient the etiology of pes planovalgus and relationship with Planter fasciitis and various treatment options were discussed.  Given patient foot structure in the setting of Planter fasciitis I believe patient will benefit from custom-made orthotics to help control the hindfoot motion support the arch of the foot and take the stress away from plantar fascial.  Patient agrees with the plan like to proceed with orthotics -orthotics were dispensed and fucntionaing well    Nicholes Rough, DPM

## 2022-12-30 DIAGNOSIS — M25511 Pain in right shoulder: Secondary | ICD-10-CM | POA: Diagnosis not present

## 2022-12-30 DIAGNOSIS — M25512 Pain in left shoulder: Secondary | ICD-10-CM | POA: Diagnosis not present

## 2023-01-01 DIAGNOSIS — M25512 Pain in left shoulder: Secondary | ICD-10-CM | POA: Diagnosis not present

## 2023-01-01 DIAGNOSIS — M25511 Pain in right shoulder: Secondary | ICD-10-CM | POA: Diagnosis not present

## 2023-01-05 DIAGNOSIS — G4733 Obstructive sleep apnea (adult) (pediatric): Secondary | ICD-10-CM | POA: Diagnosis not present

## 2023-01-05 DIAGNOSIS — G4737 Central sleep apnea in conditions classified elsewhere: Secondary | ICD-10-CM | POA: Diagnosis not present

## 2023-01-06 DIAGNOSIS — M25511 Pain in right shoulder: Secondary | ICD-10-CM | POA: Diagnosis not present

## 2023-01-06 DIAGNOSIS — M25512 Pain in left shoulder: Secondary | ICD-10-CM | POA: Diagnosis not present

## 2023-01-06 NOTE — Telephone Encounter (Signed)
Pt stated that he is waiting until next year for his surgery. He has started PT and is feeling better. I will remove him from pool. Pt stated he will call ortho.

## 2023-01-06 NOTE — Telephone Encounter (Signed)
Error

## 2023-01-06 NOTE — Telephone Encounter (Signed)
Pt did not have labs drawn at 8/26 visit. Note had been added to appt to draw CBC that day.

## 2023-01-06 NOTE — Telephone Encounter (Signed)
Glenn [name] from [facility] is requesting a callback at [phone] regarding clearance for not hearing anything since pt has been seen at last office visit. She like an update and to know when to hold Eliquis so that they can schedule his procedure. Please advise

## 2023-01-07 NOTE — Telephone Encounter (Signed)
Updated HTA auth  BIPAP- HTA auth: [id number] (exp. 12/18/22 to 03/06/23)   Patient is scheduled at [facility] for 01/27/23 at 9 pm.

## 2023-01-09 DIAGNOSIS — M25512 Pain in left shoulder: Secondary | ICD-10-CM | POA: Diagnosis not present

## 2023-01-09 DIAGNOSIS — M25511 Pain in right shoulder: Secondary | ICD-10-CM | POA: Diagnosis not present

## 2023-01-13 DIAGNOSIS — M25512 Pain in left shoulder: Secondary | ICD-10-CM | POA: Diagnosis not present

## 2023-01-13 DIAGNOSIS — M25511 Pain in right shoulder: Secondary | ICD-10-CM | POA: Diagnosis not present

## 2023-01-15 DIAGNOSIS — M25512 Pain in left shoulder: Secondary | ICD-10-CM | POA: Diagnosis not present

## 2023-01-15 DIAGNOSIS — M25511 Pain in right shoulder: Secondary | ICD-10-CM | POA: Diagnosis not present

## 2023-01-20 DIAGNOSIS — M25512 Pain in left shoulder: Secondary | ICD-10-CM | POA: Diagnosis not present

## 2023-01-20 DIAGNOSIS — M25511 Pain in right shoulder: Secondary | ICD-10-CM | POA: Diagnosis not present

## 2023-01-22 DIAGNOSIS — M25511 Pain in right shoulder: Secondary | ICD-10-CM | POA: Diagnosis not present

## 2023-01-22 DIAGNOSIS — M25512 Pain in left shoulder: Secondary | ICD-10-CM | POA: Diagnosis not present

## 2023-01-27 ENCOUNTER — Ambulatory Visit: Payer: PPO | Admitting: Neurology

## 2023-01-27 DIAGNOSIS — G4739 Other sleep apnea: Secondary | ICD-10-CM

## 2023-01-27 DIAGNOSIS — M25511 Pain in right shoulder: Secondary | ICD-10-CM | POA: Diagnosis not present

## 2023-01-27 DIAGNOSIS — I4821 Permanent atrial fibrillation: Secondary | ICD-10-CM

## 2023-01-27 DIAGNOSIS — M25512 Pain in left shoulder: Secondary | ICD-10-CM | POA: Diagnosis not present

## 2023-01-30 NOTE — Procedures (Signed)
[facility] PAP TITRATION REPORT   STUDY DATE: 01/27/2023      PATIENT NAME:  Glenn [name]         DATE OF BIRTH:  Jan 20, 1941  PATIENT ID:  [id number]    TYPE OF STUDY:  BiPAP  INTERPRETING PHYSICIAN: Melvyn Novas, MD SCORING TECHNICIAN: Domingo Cocking, RPSGT   HISTORY: This 82 year-old patient of Dr Alver Fisher returns for an in-lab titration to positive airway pressure having failed auto-CPAP therapy with central apneas emerging.  Hermes L Vellucci is here with: atrial fibrillation, persistent and COMPLEX sleep apnea by HST - 40% central apnea, moderate-severe. (The patient underwent a home sleep test on 07-24-2022, his Epworth sleepiness score had been endorsed at 7 in the fatigue severity at 24 prior to the study.  He was diagnosed with actually fairly severe sleep apnea his AHI was 35.1 not REM sleep dependent and was equally expressed in supine and in left lateral sleep position.  Snoring was not a main finding of the study.  There was no clinically significant sleep hypoxia.)  He did use auto-titration CPAP, with good compliance data: but more central apneas are arising. His sleep apnea test confirmed that 40% of central apneas were present and therefore his diagnosis is not obstructive sleep apnea but complex sleep apnea. This patient is not a candidate for inspire implant. DME [facility], now [employer]- nasal mask .  DX o is COMPLEX SLEEP APNEA and Treatment emergent Central Sleep Apnea, now dominating his residual apneas and overall insufficient control of apnea. He returned for an in-lab titration to PAP or BiPAP ST, or even ASV.  EES = 6/ 24 points, FSS endorsed at 18/ 63 points. BMI: 27.8, neck 18.25".     DESCRIPTION: A registered sleep technologist was in attendance for the duration of the recording.  Data collection, scoring, video monitoring, and reporting were performed in compliance with the AASM Manual for the Scoring of Sleep and Associated Events; (Hypopnea is  scored based on the criteria listed in Section VIII D. 1b in the AASM Manual V2.6 using a 4% oxygen desaturation rule or Hypopnea is scored based on the criteria listed in Section VIII D. 1a in the AASM Manual V2.6 using 3% oxygen desaturation and /or arousal rule).  A physician certified by the American Board of Sleep Medicine reviewed each epoch of the study.  MEDICATIONS: Norvasc, Amoxil, Eliquis, Lipitor, Hydrodiuril, Zestril, eye drops  SLEEP DATA :   SLEEP CONTINUITY AND SLEEP ARCHITECTURE:  Lights off was at 22:12: and lights on 04:59: (6.8 hours in bed).  Total sleep time was 223.5 minutes with a decreased sleep efficiency at 54.8%.  Sleep latency was increased at 61.5 minutes.  Of the total sleep time, the percentage of stage N1 sleep was 26.0%, stage N2 sleep was 57.7%, stage N3 sleep was 0.0%, and REM sleep was 16.3%.   There were 2 Stage R periods observed on this study night, 43 awakenings (i.e. transitions to Stage W from any sleep stage), and 112.0 total stage transitions. Wake after sleep onset (WASO) time accounted for 122 minutes.  BODY POSITION: Duration of total sleep and percent of total sleep in their respective position is as follows: supine 89 minutes (39.8%), non-supine 134.5 minutes (60.2%); right 83 minutes (37.1%), left 51 minutes (23.0%), and prone 00 minutes (0.0%). Total supine REM sleep time was 24 minutes (67.1% of total REM sleep).  RESPIRATORY MONITORING:  Based on CMS criteria (using a 4% oxygen desaturation rule for  [facility] PAP TITRATION REPORT   STUDY DATE: 01/27/2023      PATIENT NAME:  Glenn [name]         DATE OF BIRTH:  Jan 20, 1941  PATIENT ID:  [id number]    TYPE OF STUDY:  BiPAP  INTERPRETING PHYSICIAN: Melvyn Novas, MD SCORING TECHNICIAN: Domingo Cocking, RPSGT   HISTORY: This 82 year-old patient of Dr Alver Fisher returns for an in-lab titration to positive airway pressure having failed auto-CPAP therapy with central apneas emerging.  Hermes L Vellucci is here with: atrial fibrillation, persistent and COMPLEX sleep apnea by HST - 40% central apnea, moderate-severe. (The patient underwent a home sleep test on 07-24-2022, his Epworth sleepiness score had been endorsed at 7 in the fatigue severity at 24 prior to the study.  He was diagnosed with actually fairly severe sleep apnea his AHI was 35.1 not REM sleep dependent and was equally expressed in supine and in left lateral sleep position.  Snoring was not a main finding of the study.  There was no clinically significant sleep hypoxia.)  He did use auto-titration CPAP, with good compliance data: but more central apneas are arising. His sleep apnea test confirmed that 40% of central apneas were present and therefore his diagnosis is not obstructive sleep apnea but complex sleep apnea. This patient is not a candidate for inspire implant. DME [facility], now [employer]- nasal mask .  DX o is COMPLEX SLEEP APNEA and Treatment emergent Central Sleep Apnea, now dominating his residual apneas and overall insufficient control of apnea. He returned for an in-lab titration to PAP or BiPAP ST, or even ASV.  EES = 6/ 24 points, FSS endorsed at 18/ 63 points. BMI: 27.8, neck 18.25".     DESCRIPTION: A registered sleep technologist was in attendance for the duration of the recording.  Data collection, scoring, video monitoring, and reporting were performed in compliance with the AASM Manual for the Scoring of Sleep and Associated Events; (Hypopnea is  scored based on the criteria listed in Section VIII D. 1b in the AASM Manual V2.6 using a 4% oxygen desaturation rule or Hypopnea is scored based on the criteria listed in Section VIII D. 1a in the AASM Manual V2.6 using 3% oxygen desaturation and /or arousal rule).  A physician certified by the American Board of Sleep Medicine reviewed each epoch of the study.  MEDICATIONS: Norvasc, Amoxil, Eliquis, Lipitor, Hydrodiuril, Zestril, eye drops  SLEEP DATA :   SLEEP CONTINUITY AND SLEEP ARCHITECTURE:  Lights off was at 22:12: and lights on 04:59: (6.8 hours in bed).  Total sleep time was 223.5 minutes with a decreased sleep efficiency at 54.8%.  Sleep latency was increased at 61.5 minutes.  Of the total sleep time, the percentage of stage N1 sleep was 26.0%, stage N2 sleep was 57.7%, stage N3 sleep was 0.0%, and REM sleep was 16.3%.   There were 2 Stage R periods observed on this study night, 43 awakenings (i.e. transitions to Stage W from any sleep stage), and 112.0 total stage transitions. Wake after sleep onset (WASO) time accounted for 122 minutes.  BODY POSITION: Duration of total sleep and percent of total sleep in their respective position is as follows: supine 89 minutes (39.8%), non-supine 134.5 minutes (60.2%); right 83 minutes (37.1%), left 51 minutes (23.0%), and prone 00 minutes (0.0%). Total supine REM sleep time was 24 minutes (67.1% of total REM sleep).  RESPIRATORY MONITORING:  Based on CMS criteria (using a 4% oxygen desaturation rule for  scoring hypopneas), there were 56 apneas (24 obstructive; 24 central; 8 mixed), and 14 mostly central hypopneas.  Apnea index was 15.0. Hypopnea index was 3.8. The apnea-hypopnea index was 18.8 overall (29.0 supine, 0.0 non-supine; 0.0 REM, 0.0 supine REM). There were 0 respiratory effort-related arousals (RERAs).    OXIMETRY: Total sleep time spent at, or below 88% was 1.5 minutes, or 0.7% of total sleep time. Respiratory events were associated with  oxyhemoglobin desaturations (nadir during sleep 89%) from a mean of 98%..   EKG:  Atrial fibrillation ongoing - highly variable heart rate, prolonged R to R intervals and no p wave. The average heart rate during sleep was 44 bpm. HR in epochs 376 and 681 was 36 bpm (!!) The maximum heart rate during sleep was 102 bpm.   LIMB MOVEMENTS: There were 56 periodic limb movements of sleep (15.0/h), of which 1 (0.3/h) was associated with an arousal.  AROUSALS: There were 69 arousals in total, for an arousal index of 13.2 arousals/hour.  Of these, 33 were identified as respiratory-related arousals (8.9 /h), 1 was PLM-related arousal (0.3 /h), and 36 were non-specific arousals (9.7 /h)    IMPRESSION:  The diagnosis of COMPLEX sleep apnea with equal amount of central and obstructive events was confirmed, with persistent atrial fibrillation throughout the night.   1. BILEVEL titration started with a FFM (not sure if this was a choice of the patient, who had used a nasal mask at home) type AirFit F20i. in medium and at BiPAP pressure of 10/5 cm water- and was increased to BiPAP of 17/13 cm water pressure. This final BIPAP pressure reduced the AHI to 8.3/h. The AHI was lowest during those titration periods that included REM sleep.   2. Ongoing atrial fibrillation and severe intermittent bradycardia. No hypoxemia.   3.  Overall, the BiPAP titration was not showing optimal results for the patient, as sleep efficiency was low, sleep fragmentation persisted and the longest uninterrupted sleep period was associated with a pressure of 15/11 cm water ( with a higher CMS based AHI of 21/h).            RECOMMENDATIONS:    Use of BiPAP auto titration device from 12/8 cm water to 18/ 14 cm water pressure, easy breathe function on , heated humidity,  and mask of patient's choice. He must be fitted in supine position or reclined . Used in this titration was an AIRFIT F 30 I in medium by [employer].   Advise patient to  avoid supine sleep whenever possible .  This central/complex apnea is clearly caused by atrial fibrillation and diastolic heart failure. Elimination of the cardiac arrhythmia will likely reduce the AHI by more than 50%     Melvyn Novas, MD             [facility] Enhanced PAP Report    General Information  Name: [place], Yearby BMI: 31 Physician: ,   ID: [id number] Height: 67 in Technician: Domingo Cocking  Sex: Male Weight: 202 lb Record: [id number]  Age: 82 [Jun 27, 1940] Date: 01/27/2023 Scorer: Domingo Cocking    Recommended Settings  Protocol:   N/A  Device:   N/A  Mask:   N/A AHI:    N/A    Pressure Support:     N/A to   N/A cmH20   IPAP:     N/A to   N/A cmH20  EPAP:     N/A to   N/A cmH20  [facility] PAP TITRATION REPORT   STUDY DATE: 01/27/2023      PATIENT NAME:  Glenn [name]         DATE OF BIRTH:  Jan 20, 1941  PATIENT ID:  [id number]    TYPE OF STUDY:  BiPAP  INTERPRETING PHYSICIAN: Melvyn Novas, MD SCORING TECHNICIAN: Domingo Cocking, RPSGT   HISTORY: This 82 year-old patient of Dr Alver Fisher returns for an in-lab titration to positive airway pressure having failed auto-CPAP therapy with central apneas emerging.  Hermes L Vellucci is here with: atrial fibrillation, persistent and COMPLEX sleep apnea by HST - 40% central apnea, moderate-severe. (The patient underwent a home sleep test on 07-24-2022, his Epworth sleepiness score had been endorsed at 7 in the fatigue severity at 24 prior to the study.  He was diagnosed with actually fairly severe sleep apnea his AHI was 35.1 not REM sleep dependent and was equally expressed in supine and in left lateral sleep position.  Snoring was not a main finding of the study.  There was no clinically significant sleep hypoxia.)  He did use auto-titration CPAP, with good compliance data: but more central apneas are arising. His sleep apnea test confirmed that 40% of central apneas were present and therefore his diagnosis is not obstructive sleep apnea but complex sleep apnea. This patient is not a candidate for inspire implant. DME [facility], now [employer]- nasal mask .  DX o is COMPLEX SLEEP APNEA and Treatment emergent Central Sleep Apnea, now dominating his residual apneas and overall insufficient control of apnea. He returned for an in-lab titration to PAP or BiPAP ST, or even ASV.  EES = 6/ 24 points, FSS endorsed at 18/ 63 points. BMI: 27.8, neck 18.25".     DESCRIPTION: A registered sleep technologist was in attendance for the duration of the recording.  Data collection, scoring, video monitoring, and reporting were performed in compliance with the AASM Manual for the Scoring of Sleep and Associated Events; (Hypopnea is  scored based on the criteria listed in Section VIII D. 1b in the AASM Manual V2.6 using a 4% oxygen desaturation rule or Hypopnea is scored based on the criteria listed in Section VIII D. 1a in the AASM Manual V2.6 using 3% oxygen desaturation and /or arousal rule).  A physician certified by the American Board of Sleep Medicine reviewed each epoch of the study.  MEDICATIONS: Norvasc, Amoxil, Eliquis, Lipitor, Hydrodiuril, Zestril, eye drops  SLEEP DATA :   SLEEP CONTINUITY AND SLEEP ARCHITECTURE:  Lights off was at 22:12: and lights on 04:59: (6.8 hours in bed).  Total sleep time was 223.5 minutes with a decreased sleep efficiency at 54.8%.  Sleep latency was increased at 61.5 minutes.  Of the total sleep time, the percentage of stage N1 sleep was 26.0%, stage N2 sleep was 57.7%, stage N3 sleep was 0.0%, and REM sleep was 16.3%.   There were 2 Stage R periods observed on this study night, 43 awakenings (i.e. transitions to Stage W from any sleep stage), and 112.0 total stage transitions. Wake after sleep onset (WASO) time accounted for 122 minutes.  BODY POSITION: Duration of total sleep and percent of total sleep in their respective position is as follows: supine 89 minutes (39.8%), non-supine 134.5 minutes (60.2%); right 83 minutes (37.1%), left 51 minutes (23.0%), and prone 00 minutes (0.0%). Total supine REM sleep time was 24 minutes (67.1% of total REM sleep).  RESPIRATORY MONITORING:  Based on CMS criteria (using a 4% oxygen desaturation rule for  Max Pressure:     N/A  Backup Rate:     N/A  Humidity:     N/A AHI (4%):   N/A   Pressure Settings Phase THERAPY THERAPY THERAPY THERAPY THERAPY THERAPY   Protocol - - - - - -   Max Pressure - - - - - -   IPAP 10 12 14 14 15 17    EPAP 05 07 09 10 11 13    PS - - - - - -   Device - - - - - -   Mask - - - - - -   Backup Rate - - - - - -   Humidity - - - - - -  Time TRT 73.7m 33.48m 36.46m 97.67m 90.94m 77.87m   TST 12.77m 18.4m 28.69m 61.86m 68.34m 36.37m   Event Epoch 10 157 223 296 491 671  Sleep Stage % Wake 0.0 43.9 23.3 37.4 24.4 47.8   % REM 0.0 0.0 0.0 19.7 0.0 68.1   % N1 29.2 35.1 33.9 26.2 23.5 18.1   % N2 70.8 64.9 66.1 54.1 76.5 13.9   % N3 0.0 0.0 0.0 0.0 0.0 0.0  Respiratory Total Events 8 11 18 12 26 5    Obs. Apn. 6 7 5 2 3 1    Mixed Apn. 1 2 2 1 2  0   Cen. Apn. 1 0 0 6 13 4    Obs. Hyp. 0 2 11 3 8  0   Cen. Hyp. 0 0 0 0 0 0   AHI 40.00 35.68 38.57 11.80 22.94 8.33   Supine AHI 40.00 35.68 54.55 80.00 54.19 8.33   Prone AHI 0.00 0.00 0.00 0.00 0.00 0.00   Side AHI 0.00 0.00 34.67 10.08 13.71 0.00  Respiratory (4%) Obs. Hyp. (4%) 0.00 0.00 8.00 0.00 6.00 0.00   Cen. Hyp. (4%) 0.00 0.00 0.00 0.00 0.00 0.00   AHI (4%) 40.00  29.19 32.14 8.85 21.18 8.33   Supine AHI (4%) 40.00 29.19 54.55 80.00 54.19 8.33   Prone AHI (4%) 0.00 0.00 0.00 0.00 0.00 0.00   Side AHI (4%) 0.00 0.00 26.67 7.06 11.43 0.00  Desat Profile <= 90% 0.3m 0.56m 0.52m 5.71m 0.46m 0.30m   <= 80% 0.53m 0.75m 0.7m 5.74m 0.62m 0.3m   <= 70% 0.1m 0.62m 0.26m 5.21m 0.73m 0.56m   <= 60% 0.81m 0.55m 0.72m 5.16m 0.47m 0.63m  Arousal Index Apnea 25.0 25.9 12.9 2.0 5.3 1.7   Hypopnea 0.0 6.5 2.1 1.0 0.9 0.0   LM 0.0 0.0 2.1 4.9 0.9 0.0   Spontaneous 10.0 6.5 2.1 11.8 9.7 13.3  [facility] CPAP Summary    General Information  Name: Lakoda, Mcanany BMI: 31.64 Physician: Melvyn Novas, MD  ID: [id number] Height: 67.0 in Technician: Domingo Cocking, RPSGT  Sex: Male Weight: 202.0 lb Record: [id number]  Age: 64 [Nov 07, 1940] Date: 01/27/2023     Medical & Medication History    Pearse L Milligan is a 82 y.o. male patient who is here for revisit 10/09/2022 for follow up on newly diagnosed OSA by HST and his start on CPAP therapy. The patient underwent a home sleep test on 07-24-2022, his Epworth sleepiness score had been endorsed at 7 in the fatigue severity at 24 prior to the study. He was diagnosed with actually fairly severe obstructive sleep apnea his AHI was 35.1 not REM sleep dependent and was equally expressed in supine and in left lateral sleep position. Snoring was not a main finding of the study.

## 2023-01-30 NOTE — Addendum Note (Signed)
Addended by: Melvyn Novas on: 01/30/2023 04:40 PM   Modules accepted: Orders

## 2023-02-02 ENCOUNTER — Telehealth: Payer: Self-pay | Admitting: Neurology

## 2023-02-02 NOTE — Telephone Encounter (Signed)
-----   Message from Alamo Dohmeier sent at 01/30/2023  4:40 PM EDT -----  The diagnosis of COMPLEX sleep apnea with equal amount of central and obstructive events was confirmed, with persistent atrial fibrillation throughout the night.   1. BILEVEL titration started with a FFM (not sure if this was a choice of the patient, who had used a nasal mask at home) type AirFit F20i. in medium and at BiPAP pressure of 10/5 cm water- and was increased to BiPAP of 17/13 cm water pressure. This final BIPAP pressure reduced the AHI to 8.3/h    Auto BiPAP device for home use ordered.

## 2023-02-02 NOTE — Telephone Encounter (Signed)
I called pt. I advised pt that Dr. Vickey Huger reviewed their sleep study results and found that pt was titrated to BiPAP. Dr. Vickey Huger recommends that pt starts BiPAP with auto setting. I reviewed PAP compliance expectations with the pt. Pt is agreeable to starting a BiPAP. I advised pt that an order will be sent to a DME, [facility], and [facility] will call the pt within about one week after they file with the pt's insurance. [facility]  will show the pt how to use the machine, fit for masks, and troubleshoot the BiPAP if needed. A follow up appt was made for insurance purposes with Dr. Vickey Huger on 05/04/2022 at 10:30 am. Pt verbalized understanding to arrive 15 minutes early and bring their BiPAP. Pt verbalized understanding of results. Pt had no questions at this time but was encouraged to call back if questions arise. I have sent the order to [facility] and have received confirmation that they have received the order.

## 2023-02-04 DIAGNOSIS — G4733 Obstructive sleep apnea (adult) (pediatric): Secondary | ICD-10-CM | POA: Diagnosis not present

## 2023-02-04 DIAGNOSIS — G4737 Central sleep apnea in conditions classified elsewhere: Secondary | ICD-10-CM | POA: Diagnosis not present

## 2023-02-05 DIAGNOSIS — M25511 Pain in right shoulder: Secondary | ICD-10-CM | POA: Diagnosis not present

## 2023-02-05 DIAGNOSIS — M25512 Pain in left shoulder: Secondary | ICD-10-CM | POA: Diagnosis not present

## 2023-02-10 DIAGNOSIS — M25512 Pain in left shoulder: Secondary | ICD-10-CM | POA: Diagnosis not present

## 2023-02-10 DIAGNOSIS — M25511 Pain in right shoulder: Secondary | ICD-10-CM | POA: Diagnosis not present

## 2023-02-12 DIAGNOSIS — M25511 Pain in right shoulder: Secondary | ICD-10-CM | POA: Diagnosis not present

## 2023-02-12 DIAGNOSIS — M25512 Pain in left shoulder: Secondary | ICD-10-CM | POA: Diagnosis not present

## 2023-02-17 DIAGNOSIS — M25511 Pain in right shoulder: Secondary | ICD-10-CM | POA: Diagnosis not present

## 2023-02-17 DIAGNOSIS — M25512 Pain in left shoulder: Secondary | ICD-10-CM | POA: Diagnosis not present

## 2023-02-19 DIAGNOSIS — M25512 Pain in left shoulder: Secondary | ICD-10-CM | POA: Diagnosis not present

## 2023-02-19 DIAGNOSIS — M25511 Pain in right shoulder: Secondary | ICD-10-CM | POA: Diagnosis not present

## 2023-02-24 DIAGNOSIS — M25512 Pain in left shoulder: Secondary | ICD-10-CM | POA: Diagnosis not present

## 2023-02-24 DIAGNOSIS — M25511 Pain in right shoulder: Secondary | ICD-10-CM | POA: Diagnosis not present

## 2023-02-25 ENCOUNTER — Other Ambulatory Visit: Payer: Self-pay | Admitting: Physician Assistant

## 2023-02-26 DIAGNOSIS — M25512 Pain in left shoulder: Secondary | ICD-10-CM | POA: Diagnosis not present

## 2023-02-26 DIAGNOSIS — M25511 Pain in right shoulder: Secondary | ICD-10-CM | POA: Diagnosis not present

## 2023-03-02 ENCOUNTER — Telehealth: Payer: Self-pay

## 2023-03-02 NOTE — Telephone Encounter (Signed)
We received an faxes from [facility] stating that they was unable to reach out to the patient and schedule the set up dates. If they don't hear from the patient by 03/12/23 they will assume you do not  want the equipment and your order will be cancelled.

## 2023-03-03 DIAGNOSIS — D692 Other nonthrombocytopenic purpura: Secondary | ICD-10-CM | POA: Diagnosis not present

## 2023-03-03 DIAGNOSIS — M25511 Pain in right shoulder: Secondary | ICD-10-CM | POA: Diagnosis not present

## 2023-03-03 DIAGNOSIS — Z85820 Personal history of malignant melanoma of skin: Secondary | ICD-10-CM | POA: Diagnosis not present

## 2023-03-03 DIAGNOSIS — L821 Other seborrheic keratosis: Secondary | ICD-10-CM | POA: Diagnosis not present

## 2023-03-03 DIAGNOSIS — Z85828 Personal history of other malignant neoplasm of skin: Secondary | ICD-10-CM | POA: Diagnosis not present

## 2023-03-03 DIAGNOSIS — D1801 Hemangioma of skin and subcutaneous tissue: Secondary | ICD-10-CM | POA: Diagnosis not present

## 2023-03-03 DIAGNOSIS — M25512 Pain in left shoulder: Secondary | ICD-10-CM | POA: Diagnosis not present

## 2023-03-03 DIAGNOSIS — L281 Prurigo nodularis: Secondary | ICD-10-CM | POA: Diagnosis not present

## 2023-03-03 DIAGNOSIS — B356 Tinea cruris: Secondary | ICD-10-CM | POA: Diagnosis not present

## 2023-03-05 DIAGNOSIS — M25511 Pain in right shoulder: Secondary | ICD-10-CM | POA: Diagnosis not present

## 2023-03-05 DIAGNOSIS — M25512 Pain in left shoulder: Secondary | ICD-10-CM | POA: Diagnosis not present

## 2023-03-10 DIAGNOSIS — M25511 Pain in right shoulder: Secondary | ICD-10-CM | POA: Diagnosis not present

## 2023-03-10 DIAGNOSIS — M25512 Pain in left shoulder: Secondary | ICD-10-CM | POA: Diagnosis not present

## 2023-03-12 DIAGNOSIS — M25512 Pain in left shoulder: Secondary | ICD-10-CM | POA: Diagnosis not present

## 2023-03-12 DIAGNOSIS — M25511 Pain in right shoulder: Secondary | ICD-10-CM | POA: Diagnosis not present

## 2023-03-17 DIAGNOSIS — M25511 Pain in right shoulder: Secondary | ICD-10-CM | POA: Diagnosis not present

## 2023-03-17 DIAGNOSIS — M25512 Pain in left shoulder: Secondary | ICD-10-CM | POA: Diagnosis not present

## 2023-03-24 DIAGNOSIS — G4733 Obstructive sleep apnea (adult) (pediatric): Secondary | ICD-10-CM | POA: Diagnosis not present

## 2023-03-24 DIAGNOSIS — M25511 Pain in right shoulder: Secondary | ICD-10-CM | POA: Diagnosis not present

## 2023-03-24 DIAGNOSIS — M25512 Pain in left shoulder: Secondary | ICD-10-CM | POA: Diagnosis not present

## 2023-03-31 DIAGNOSIS — M25511 Pain in right shoulder: Secondary | ICD-10-CM | POA: Diagnosis not present

## 2023-03-31 DIAGNOSIS — M25512 Pain in left shoulder: Secondary | ICD-10-CM | POA: Diagnosis not present

## 2023-04-02 DIAGNOSIS — M25512 Pain in left shoulder: Secondary | ICD-10-CM | POA: Diagnosis not present

## 2023-04-02 DIAGNOSIS — M25511 Pain in right shoulder: Secondary | ICD-10-CM | POA: Diagnosis not present

## 2023-04-07 DIAGNOSIS — M25511 Pain in right shoulder: Secondary | ICD-10-CM | POA: Diagnosis not present

## 2023-04-07 DIAGNOSIS — M25512 Pain in left shoulder: Secondary | ICD-10-CM | POA: Diagnosis not present

## 2023-04-09 ENCOUNTER — Ambulatory Visit: Payer: PPO | Admitting: Neurology

## 2023-04-09 DIAGNOSIS — M25512 Pain in left shoulder: Secondary | ICD-10-CM | POA: Diagnosis not present

## 2023-04-09 DIAGNOSIS — M25511 Pain in right shoulder: Secondary | ICD-10-CM | POA: Diagnosis not present

## 2023-04-17 ENCOUNTER — Other Ambulatory Visit: Payer: Self-pay | Admitting: Physician Assistant

## 2023-04-21 DIAGNOSIS — M25512 Pain in left shoulder: Secondary | ICD-10-CM | POA: Diagnosis not present

## 2023-04-21 DIAGNOSIS — M25511 Pain in right shoulder: Secondary | ICD-10-CM | POA: Diagnosis not present

## 2023-04-23 DIAGNOSIS — M25511 Pain in right shoulder: Secondary | ICD-10-CM | POA: Diagnosis not present

## 2023-04-23 DIAGNOSIS — M25512 Pain in left shoulder: Secondary | ICD-10-CM | POA: Diagnosis not present

## 2023-04-24 DIAGNOSIS — G4733 Obstructive sleep apnea (adult) (pediatric): Secondary | ICD-10-CM | POA: Diagnosis not present

## 2023-04-29 DIAGNOSIS — M25511 Pain in right shoulder: Secondary | ICD-10-CM | POA: Diagnosis not present

## 2023-04-29 DIAGNOSIS — M25512 Pain in left shoulder: Secondary | ICD-10-CM | POA: Diagnosis not present

## 2023-04-30 DIAGNOSIS — M25511 Pain in right shoulder: Secondary | ICD-10-CM | POA: Diagnosis not present

## 2023-04-30 DIAGNOSIS — M25512 Pain in left shoulder: Secondary | ICD-10-CM | POA: Diagnosis not present

## 2023-05-01 ENCOUNTER — Encounter: Payer: Self-pay | Admitting: Podiatry

## 2023-05-01 ENCOUNTER — Ambulatory Visit: Payer: PPO | Admitting: Podiatry

## 2023-05-01 DIAGNOSIS — M79674 Pain in right toe(s): Secondary | ICD-10-CM

## 2023-05-01 DIAGNOSIS — M79675 Pain in left toe(s): Secondary | ICD-10-CM

## 2023-05-01 DIAGNOSIS — B351 Tinea unguium: Secondary | ICD-10-CM | POA: Diagnosis not present

## 2023-05-01 NOTE — Progress Notes (Signed)
  Subjective:  Patient ID: Glenn [name], male    DOB: 1940-09-23,  MRN: [id number]  Chief Complaint  Patient presents with   Routine Post Op    Patient states that his feet are bothering him more kind of  numb and having trouble walking more then he had in the past. No medication for pain    83 y.o. male returns for the above complaint.  Patient presents with thickened elongated dystrophic toenails x10.  Patient states painful to the patient diabetic he would like to have the nails debrided down he denies any other acute complaints.  Objective:   There were no vitals filed for this visit.  Podiatric Exam: Vascular: dorsalis pedis and posterior tibial pulses are palpable bilateral. Capillary return is immediate. Temperature gradient is WNL. Skin turgor WNL  Sensorium: Normal Semmes Weinstein monofilament test. Normal tactile sensation bilaterally. Nail Exam: Pt has thick disfigured discolored nails with subungual debris noted bilateral entire nail hallux through fifth toenails.  Pain on palpation to the nails. Ulcer Exam: There is no evidence of ulcer or pre-ulcerative changes or infection. Orthopedic Exam: Muscle tone and strength are WNL. No limitations in general ROM. No crepitus or effusions noted.  Skin: No Porokeratosis. No infection or ulcers    Assessment & Plan:   1. Pain due to onychomycosis of toenails of both feet     Patient was evaluated and treated and all questions answered.  Onychomycosis with pain  -Nails palliatively debrided as below. -Educated on self-care  Procedure: Nail Debridement Rationale: pain  Type of Debridement: manual, sharp debridement. Instrumentation: Nail nipper, rotary burr. Number of Nails: 10  Procedures and Treatment: Consent by patient was obtained for treatment procedures. The patient understood the discussion of treatment and procedures well. All questions were answered thoroughly reviewed. Debridement of mycotic and hypertrophic  toenails, 1 through 5 bilateral and clearing of subungual debris. No ulceration, no infection noted.  Return Visit-Office Procedure: Patient instructed to return to the office for a follow up visit 3 months for continued evaluation and treatment.  Franky Blanch, DPM    No follow-ups on file.

## 2023-05-04 DIAGNOSIS — M19012 Primary osteoarthritis, left shoulder: Secondary | ICD-10-CM | POA: Diagnosis not present

## 2023-05-04 DIAGNOSIS — M19011 Primary osteoarthritis, right shoulder: Secondary | ICD-10-CM | POA: Diagnosis not present

## 2023-05-05 ENCOUNTER — Ambulatory Visit: Payer: PPO | Admitting: Neurology

## 2023-05-06 NOTE — Progress Notes (Unsigned)
 Patient was rescheduled

## 2023-05-07 DIAGNOSIS — M25511 Pain in right shoulder: Secondary | ICD-10-CM | POA: Diagnosis not present

## 2023-05-07 DIAGNOSIS — M25512 Pain in left shoulder: Secondary | ICD-10-CM | POA: Diagnosis not present

## 2023-05-08 ENCOUNTER — Telehealth: Payer: Self-pay | Admitting: Internal Medicine

## 2023-05-08 DIAGNOSIS — Z01818 Encounter for other preprocedural examination: Secondary | ICD-10-CM

## 2023-05-08 NOTE — Telephone Encounter (Signed)
   Pre-operative Risk Assessment    Patient Name: Glenn [name]  DOB: 11-02-1940 MRN: [id number]      Request for Surgical Clearance    Procedure:   Left reverse total shoulder arthroplasty  Date of Surgery:  Clearance TBD                                 Surgeon:  Berline Lopes, MD Surgeon's Group or Practice Name:  [facility] Phone number:  [phone] Fax number:  [phone]   Type of Clearance Requested:   - Medical  - Pharmacy:  Hold if applicable      Type of Anesthesia:   choice   Additional requests/questions:    SignedSeymour Bars   05/08/2023, 4:35 PM

## 2023-05-11 ENCOUNTER — Telehealth: Payer: Self-pay | Admitting: Neurology

## 2023-05-11 DIAGNOSIS — M25512 Pain in left shoulder: Secondary | ICD-10-CM | POA: Diagnosis not present

## 2023-05-11 DIAGNOSIS — M25511 Pain in right shoulder: Secondary | ICD-10-CM | POA: Diagnosis not present

## 2023-05-11 NOTE — Telephone Encounter (Signed)
Received a surgical clearance form from [facility]. Dr Dohmeier evaluates and treats the patient for sleep apnea. He was diagnosed via Sleep study that was completed 07/22/22 and then a titration study was completed 01/27/2023. Pt was started on BiPAP.  Pt does have a history of A Fib and is treated by cardiology. Would recommend getting cardiac clearance for medication purposes to be held.  From neurology/sleep clinic side, pt would be ok to proceed forward with surgery. Anesthesia should be made aware of baseline apnea and the use of BiPAP machine for post operative purposes.

## 2023-05-11 NOTE — Telephone Encounter (Unsigned)
Patient with diagnosis of afib on Eliquis for anticoagulation.    Procedure:  Left reverse total shoulder arthroplasty  Date of procedure: TBD   CHA2DS2-VASc Score = 3  {Confirm score is correct.  If not, click here to update score.  REFRESH note.  :1} This indicates a 3.2% annual risk of stroke. The patient's score is based upon: CHF History: 0 HTN History: 1 Diabetes History: 0 Stroke History: 0 Vascular Disease History: 0 Age Score: 2 Gender Score: 0   {This patient has a significant risk of stroke if diagnosed with atrial fibrillation.  Please consider VKA or DOAC agent for anticoagulation if the bleeding risk is acceptable.   You can also use the SmartPhrase .[email or web address] for documentation.   :[id number]}   CrCl *** Platelet count ***  Patient does/does not*** require pre-op antibiotics for dental procedure.  Per office protocol, patient can hold *** for *** days prior to procedure.   Patient ***will/will not need bridging with Lovenox (enoxaparin) around procedure.  **This guidance is not considered finalized until pre-operative APP has relayed final recommendations.**

## 2023-05-12 ENCOUNTER — Telehealth: Payer: Self-pay | Admitting: *Deleted

## 2023-05-12 NOTE — Telephone Encounter (Signed)
Per preop APP the pt will need a tele appt as well as labs. Pt will get labs done in the near future with in 1 week of tele appt. Lab orders have been placed BMP.   Med rec and consent are done. Tele appt 06/12/23.       Patient Consent for Virtual Visit        [name] has provided verbal consent on 05/12/2023 for a virtual visit (video or telephone).   CONSENT FOR VIRTUAL VISIT FOR:  Glenn [name]  By participating in this virtual visit I agree to the following:  I hereby voluntarily request, consent and authorize [facility] and its employed or contracted physicians, physician assistants, nurse practitioners or other licensed health care professionals (the Practitioner), to provide me with telemedicine health care services (the "Services") as deemed necessary by the treating Practitioner. I acknowledge and consent to receive the Services by the Practitioner via telemedicine. I understand that the telemedicine visit will involve communicating with the Practitioner through live audiovisual communication technology and the disclosure of certain medical information by electronic transmission. I acknowledge that I have been given the opportunity to request an in-person assessment or other available alternative prior to the telemedicine visit and am voluntarily participating in the telemedicine visit.  I understand that I have the right to withhold or withdraw my consent to the use of telemedicine in the course of my care at any time, without affecting my right to future care or treatment, and that the Practitioner or I may terminate the telemedicine visit at any time. I understand that I have the right to inspect all information obtained and/or recorded in the course of the telemedicine visit and may receive copies of available information for a reasonable fee.  I understand that some of the potential risks of receiving the Services via telemedicine include:  Delay or interruption in medical  evaluation due to technological equipment failure or disruption; Information transmitted may not be sufficient (e.g. poor resolution of images) to allow for appropriate medical decision making by the Practitioner; and/or  In rare instances, security protocols could fail, causing a breach of personal health information.  Furthermore, I acknowledge that it is my responsibility to provide information about my medical history, conditions and care that is complete and accurate to the best of my ability. I acknowledge that Practitioner's advice, recommendations, and/or decision may be based on factors not within their control, such as incomplete or inaccurate data provided by me or distortions of diagnostic images or specimens that may result from electronic transmissions. I understand that the practice of medicine is not an exact science and that Practitioner makes no warranties or guarantees regarding treatment outcomes. I acknowledge that a copy of this consent can be made available to me via my patient portal [facility] MyChart), or I can request a printed copy by calling the office of [facility].    I understand that my insurance will be billed for this visit.   I have read or had this consent read to me. I understand the contents of this consent, which adequately explains the benefits and risks of the Services being provided via telemedicine.  I have been provided ample opportunity to ask questions regarding this consent and the Services and have had my questions answered to my satisfaction. I give my informed consent for the services to be provided through the use of telemedicine in my medical care

## 2023-05-12 NOTE — Telephone Encounter (Signed)
Per preop APP the pt will need a tele appt as well as labs. Pt will get labs done in the near future with in 1 week of tele appt. Lab orders have been placed BMP.   Med rec and consent are done. Tele appt 06/12/23.

## 2023-05-14 DIAGNOSIS — M25511 Pain in right shoulder: Secondary | ICD-10-CM | POA: Diagnosis not present

## 2023-05-14 DIAGNOSIS — M25512 Pain in left shoulder: Secondary | ICD-10-CM | POA: Diagnosis not present

## 2023-05-18 ENCOUNTER — Telehealth: Payer: Self-pay | Admitting: Internal Medicine

## 2023-05-18 ENCOUNTER — Other Ambulatory Visit: Payer: Self-pay

## 2023-05-18 ENCOUNTER — Ambulatory Visit: Payer: PPO | Admitting: Family Medicine

## 2023-05-18 VITALS — BP 138/82 | Ht 70.0 in | Wt 190.0 lb

## 2023-05-18 DIAGNOSIS — K4090 Unilateral inguinal hernia, without obstruction or gangrene, not specified as recurrent: Secondary | ICD-10-CM

## 2023-05-18 NOTE — Telephone Encounter (Signed)
Spoke with patient and he states his ankles are swollen. He also sometimes experience SOB w/exertion. Denies chest. Not currently experiencing SOB. He does not think he has gained weight but he does not weigh himself. He states BP has been normal for him.  He hs compression stockings but does not wear them everyday. Advised to wear compression stockings daily, elevated feet and legs whenever possible. ED precautions discussed. Will forward to provider

## 2023-05-18 NOTE — Patient Instructions (Addendum)
I'm concerned you have a small direct inguinal hernia (small weakness in the abdominal wall). Do core strengthening gently. Avoid heavy lifting and heavy weights. Activities as tolerated. Compression underwear/shorts may help. If this bothers you enough let me know and we can refer you to general surgery to discuss hernia repair.

## 2023-05-18 NOTE — Telephone Encounter (Signed)
Ask the patient to stop the amlodipine. It is notorious for causing leg swelling. Have him follow his sbp. If it is over 150, let us know and we will add something else. GT

## 2023-05-18 NOTE — Telephone Encounter (Signed)
Pt c/o swelling/edema: STAT if pt has developed SOB within 24 hours  If swelling, where is the swelling located? Legs and ankles  How much weight have you gained and in what time span? Doesn't weigh himself  Have you gained 2 pounds in a day or 5 pounds in a week? Doesn't know   Do you have a log of your daily weights (if so, list)? No   Are you currently taking a fluid pill? Yes   Are you currently SOB? No, is occurring when exerting himself  Have you traveled recently in a car or plane for an extended period of time? No   Wife has noticed SOB has worsened when going upstairs. Patient states it is more difficult to go upstairs than it was before.   He also reports there is discoloration due to the swelling. Please advise.

## 2023-05-19 DIAGNOSIS — Z01818 Encounter for other preprocedural examination: Secondary | ICD-10-CM | POA: Diagnosis not present

## 2023-05-19 DIAGNOSIS — E785 Hyperlipidemia, unspecified: Secondary | ICD-10-CM | POA: Diagnosis not present

## 2023-05-19 DIAGNOSIS — N1831 Chronic kidney disease, stage 3a: Secondary | ICD-10-CM | POA: Diagnosis not present

## 2023-05-19 DIAGNOSIS — M7989 Other specified soft tissue disorders: Secondary | ICD-10-CM | POA: Diagnosis not present

## 2023-05-19 DIAGNOSIS — D6869 Other thrombophilia: Secondary | ICD-10-CM | POA: Diagnosis not present

## 2023-05-19 DIAGNOSIS — R0602 Shortness of breath: Secondary | ICD-10-CM | POA: Diagnosis not present

## 2023-05-19 DIAGNOSIS — I129 Hypertensive chronic kidney disease with stage 1 through stage 4 chronic kidney disease, or unspecified chronic kidney disease: Secondary | ICD-10-CM | POA: Diagnosis not present

## 2023-05-19 DIAGNOSIS — M25512 Pain in left shoulder: Secondary | ICD-10-CM | POA: Diagnosis not present

## 2023-05-19 LAB — LAB REPORT - SCANNED
Calcium: 9.2
EGFR: 71.5

## 2023-05-19 NOTE — Progress Notes (Signed)
PCP: Cleatis Polka., MD  Subjective:   HPI: Patient is a 83 y.o. male here for left groin pain.  Patient reports over past month to 6 weeks he's noticed left groin pain worse with exercise. Feels this more with playing tennis. Pain is fairly medial. Does not feel this other times. Has been resting from playing tennis as a result and does not hurt him as bad. No bowel/bladder changes - has some urinary leakage issues since his prostate surgery but this is unchanged. No acute injury.  Past Medical History:  Diagnosis Date   Atrial fibrillation (HCC)    takes aspirin for this   Brachial plexus injury 1995   Left arm   Dysrhythmia 2009   A-fib   HTN (hypertension)    6 years   Injury of brachial plexus    Prostate cancer (HCC)    Prostate cancer (HCC)    Torn rotator cuff    Torn rotator cuff 2000   Right arm    Current Outpatient Medications on File Prior to Visit  Medication Sig Dispense Refill   Acetaminophen (TYLENOL) 325 MG CAPS Take 325 mg by mouth as needed.     amLODipine (NORVASC) 5 MG tablet Take 1 tablet (5 mg total) by mouth daily. 90 tablet 3   amoxicillin (AMOXIL) 500 MG capsule Take 2,000 mg by mouth See admin instructions. Take 4 capsules (2000 mg) by mouth 1 hour prior to dental procedures.     apixaban (ELIQUIS) 5 MG TABS tablet TAKE 1 TABLET BY MOUTH TWICE A DAY 180 tablet 1   atorvastatin (LIPITOR) 10 MG tablet TAKE 1 TABLET BY MOUTH DAILY 90 tablet 3   hydrochlorothiazide (HYDRODIURIL) 25 MG tablet TAKE 1 TABLET BY MOUTH DAILY 90 tablet 3   LAGEVRIO 200 MG CAPS capsule SMARTSIG:4 Capsule(s) By Mouth Every 12 Hours     lisinopril (ZESTRIL) 40 MG tablet TAKE 1 TABLET BY MOUTH DAILY 90 tablet 3   Na Sulfate-K Sulfate-Mg Sulf 17.5-3.13-1.6 GM/177ML SOLN Take by mouth.     tetrahydrozoline 0.05 % ophthalmic solution Place 1-2 drops into both eyes daily as needed (dry/irritated eyes.).     No current facility-administered medications on file prior to  visit.    Past Surgical History:  Procedure Laterality Date   ETT  2006   High fitness but bp of 255   Int fixation left forearm  01/19/1989   KNEE SURGERY     left   MOHS SURGERY     PROSTATECTOMY     removal of basal cell     TONSILLECTOMY     TOTAL KNEE ARTHROPLASTY Right 08/09/2019   Procedure: TOTAL KNEE ARTHROPLASTY;  Surgeon: Durene Romans, MD;  Location: [facility];  Service: Orthopedics;  Laterality: Right;  70 mins    No Known Allergies  BP 138/82   Ht 5\' 10"  (1.778 m)   Wt 190 lb (86.2 kg)   BMI 27.26 kg/m      02/07/2020   11:28 AM  [facility]  Frequency of aerobic exercise (# of days/week) 6  Average time in minutes 35  Frequency of strengthening activities (# of days/week) 0        No data to display              Objective:  Physical Exam:  Gen: NAD, comfortable in exam room  Left hip: No deformity. Full range of motion with 5/5 strength adduction and flexion without pain. No tenderness to palpation.  Neurovascularly intact distally. Negative logroll Negative faber, fadir, and piriformis stretches.  GU:  No bulge or mass palpated in inguinal canal on valsalva.  Tenderness just superior to pubis on left with bulge palpated on valsalva consistent with direct inguinal hernia.  MSK u/s:  Adductor tendons intact on left without abnormalities.  No irregularity of the pubis.  On dynamic scan in area of discomfort he does have herniation visualized of intraabdominal contents without strangulation.   Assessment & Plan:  1. Left direct inguinal hernia - discussed with patient this would be an elective procedure along with risks of incarceration should he not have this surgically repaired.  He would like to wait on surgical referral for now.  Will focus on light core strengthening, avoiding strenuous activity and weight lifting.  Compression shorts/underwear.  Let us know how he's doing going forward and if he would like  referral.

## 2023-05-20 ENCOUNTER — Other Ambulatory Visit: Payer: Self-pay | Admitting: Physician Assistant

## 2023-05-20 DIAGNOSIS — I4821 Permanent atrial fibrillation: Secondary | ICD-10-CM

## 2023-05-20 NOTE — Telephone Encounter (Signed)
Left message to call back

## 2023-05-20 NOTE — Telephone Encounter (Addendum)
Eliquis 5mg  refill request received. Patient is 83 years old, weight-86.2kg, Crea-1.28 on 05/12/21 via scanned labs and is pending labs per clearance note on 05/08/23, Diagnosis-Afib, and last seen by Dr. Ladona Ridgel on 12/15/22. Dose is appropriate based on dosing criteria.  Pt needs updated BMET lab and it has been ordered for upcoming procedure and states will be drawn one week before TeleVisit  which is on 06/12/23.

## 2023-05-22 NOTE — Telephone Encounter (Signed)
Spoke with Glenn [name]. Told Glenn [name] recommendation from Dr Ladona Ridgel. All pts questions where addressed at the time of call. Glenn [name] said he may be more comfortable taking 1/2 amlodipine. Told him to let me know and I would speak with Dr Ladona Ridgel about it. Glenn [name] also stated he had blood work at PCP and asked if we could see that. Told him I didn't currently see it but he could have them fax it in for review. Glenn [name] stated understanding

## 2023-05-25 DIAGNOSIS — G4733 Obstructive sleep apnea (adult) (pediatric): Secondary | ICD-10-CM | POA: Diagnosis not present

## 2023-06-10 NOTE — Telephone Encounter (Signed)
 I s/w the pt and he tells me that he just had recent labs with PCP. He talked about one of the results was BUN. I stated to the pt that sounds like that Dr. Clelia Croft did have a BMP done. I informed the pt that I will call PCP and have results faxed to our office.   I left a message for PCP, Dondra Spry in the med rec dept for PCP office if she could fax results over for BMP to fax [phone] attn:preop team Okey Regal

## 2023-06-10 NOTE — Telephone Encounter (Signed)
 Pre-op team,   Patient is scheduled for VV on 2/21. I do not see that he has come in for labs. Will you please contact the patient to remind him he needs labs prior to virtual visit.   Thank you!  DW

## 2023-06-10 NOTE — Progress Notes (Unsigned)
 Virtual Visit via Telephone Note   Because of Glenn [name] co-morbid illnesses, he is at least at moderate risk for complications without adequate follow up.  This format is felt to be most appropriate for this patient at this time.  Due to technical limitations with video connection (technology), today's appointment will be conducted as an audio only telehealth visit, and Caroll L Thoennes verbally agreed to proceed in this manner.   All issues noted in this document were discussed and addressed.  No physical exam could be performed with this format.  Evaluation Performed:  Preoperative cardiovascular risk assessment _____________   Date:  06/12/2023   Patient ID:  Glenn [name], DOB [birth year], MRN [id number] Patient Location:  Home Provider location:   Office  Primary Care Provider:  Cleatis Polka., MD Primary Cardiologist:  Dr. Sharrell Ku   Chief Complaint / Patient Profile   83 y.o. y/o male with a h/o HTN, prostate cancer (s/p prostatectomy), and permanent AFib on a/c   He is pending left reverse shoulder surgery with Dr.Justin Ave Filter on date to be determined, and presents today for telephonic preoperative cardiovascular risk assessment and recommendations to hold Eliquis perioperatively.   History of Present Illness    Glenn [name] is a 83 y.o. male who presents via audio/video conferencing for a telehealth visit today.  Pt was last seen in cardiology clinic on 12/15/2022 by Dr.Taylor.  At that time Braxton L Umana was doing well .  The patient is now pending procedure as outlined above. Since his last visit, he reports more swelling in his ankles since being seen last. They asked him to stop taking amlodipine and is wearing support stockings.He does not report excessive bleeding on Eliquis.  Past Medical History    Past Medical History:  Diagnosis Date   Atrial fibrillation (HCC)    takes aspirin for this   Brachial plexus injury 1995   Left arm   Dysrhythmia 2009   A-fib   HTN  (hypertension)    6 years   Injury of brachial plexus    Prostate cancer [facility])    Prostate cancer (HCC)    Torn rotator cuff    Torn rotator cuff 2000   Right arm   Past Surgical History:  Procedure Laterality Date   ETT  2006   High fitness but bp of 255   Int fixation left forearm  01/19/1989   KNEE SURGERY     left   MOHS SURGERY     PROSTATECTOMY     removal of basal cell     TONSILLECTOMY     TOTAL KNEE ARTHROPLASTY Right 08/09/2019   Procedure: TOTAL KNEE ARTHROPLASTY;  Surgeon: Durene Romans, MD;  Location: [facility];  Service: Orthopedics;  Laterality: Right;  70 mins    Allergies  No Known Allergies  Home Medications    Prior to Admission medications   Medication Sig Start Date End Date Taking? Authorizing Provider  Acetaminophen (TYLENOL) 325 MG CAPS Take 325 mg by mouth as needed.    [provider]  amLODipine (NORVASC) 5 MG tablet Take 1 tablet (5 mg total) by mouth daily. 06/20/22   Sheilah Pigeon, PA-C  amoxicillin (AMOXIL) 500 MG capsule Take 2,000 mg by mouth See admin instructions. Take 4 capsules (2000 mg) by mouth 1 hour prior to dental procedures.    [provider]  apixaban (ELIQUIS) 5 MG TABS tablet TAKE 1 TABLET BY MOUTH TWICE A DAY 05/20/23  Marinus Maw, MD  atorvastatin (LIPITOR) 10 MG tablet TAKE 1 TABLET BY MOUTH DAILY 02/25/23   Sheilah Pigeon, PA-C  hydrochlorothiazide (HYDRODIURIL) 25 MG tablet TAKE 1 TABLET BY MOUTH DAILY 12/09/22   Sheilah Pigeon, PA-C  LAGEVRIO 200 MG CAPS capsule SMARTSIG:4 Capsule(s) By Mouth Every 12 Hours 10/30/22   [provider]  lisinopril (ZESTRIL) 40 MG tablet TAKE 1 TABLET BY MOUTH DAILY 04/17/23   Sheilah Pigeon, PA-C  Na Sulfate-K Sulfate-Mg Sulf 17.5-3.13-1.6 GM/177ML SOLN Take by mouth. 09/09/22   [provider]  tetrahydrozoline 0.05 % ophthalmic solution Place 1-2 drops into both eyes daily as needed (dry/irritated eyes.).    [provider]    Physical  Exam    Vital Signs:  Orien L Constantino does not have vital signs available for review today.  Given telephonic nature of communication, physical exam is limited. AAOx3. NAD. Normal affect.  Speech and respirations are unlabored.  Accessory Clinical Findings    None  Assessment & Plan    1.  Preoperative Cardiovascular Risk Assessment: According to the Revised Cardiac Risk Index (RCRI), his Perioperative Risk of Major Cardiac Event is (%): 0.9  His Functional Capacity in METs is: 9.56 according to the [employer] Activity Status Index (DASI).   The patient was advised that if he develops new symptoms prior to surgery to contact our office to arrange for a follow-up visit, and he verbalized understanding.  Okay to hold Eliquis for 3 days prior to procedure.  Does not need Lovenox bridge   A copy of this note will be routed to requesting surgeon.   Time:   Today, I have spent 10 minutes with the patient with telehealth technology discussing medical history, symptoms, and management plan.     Joni Reining, NP  06/12/2023, 10:04 AM

## 2023-06-11 ENCOUNTER — Other Ambulatory Visit: Payer: Self-pay | Admitting: Physician Assistant

## 2023-06-11 NOTE — Telephone Encounter (Signed)
 Labs received today from PCP:  BUN 29 CREATININE 1.0 K+ 3.9   I called PCP back and asked about CBC for plt count.

## 2023-06-11 NOTE — Telephone Encounter (Signed)
 CrCl  =  76 Platelets  = 229  Okay to hold Eliquis for 3 days prior to procedure.   Does not need Lovenox bridge

## 2023-06-11 NOTE — Telephone Encounter (Signed)
 Platelet 229

## 2023-06-12 ENCOUNTER — Ambulatory Visit: Payer: PPO | Attending: Cardiology

## 2023-06-12 DIAGNOSIS — Z01810 Encounter for preprocedural cardiovascular examination: Secondary | ICD-10-CM | POA: Diagnosis not present

## 2023-06-12 DIAGNOSIS — Z01818 Encounter for other preprocedural examination: Secondary | ICD-10-CM

## 2023-06-15 ENCOUNTER — Other Ambulatory Visit: Payer: Self-pay | Admitting: Orthopedic Surgery

## 2023-06-23 ENCOUNTER — Ambulatory Visit: Payer: PPO | Admitting: Neurology

## 2023-07-01 NOTE — Care Plan (Signed)
 Ortho Bundle Case Management Note  Patient Details  Name: Glenn [name] MRN: [id number] Date of Birth: 1941/04/02   Will discharge to home with family. No DME needed at this time. OPPT set up with [facility] Co[facility]. Patient and MD in agreement with plan. Choice offered                   DME Arranged:    DME Agency:     HH Arranged:    HH Agency:     Additional Comments: Please contact me with any questions of if this plan should need to change.  Shauna Hugh,  RN,BSN,MHA,CCM  [facility]ist  [phone] 07/01/2023, 4:10 PM

## 2023-07-02 NOTE — Patient Instructions (Signed)
 SURGICAL WAITING ROOM VISITATION  Patients having surgery or a procedure may have no more than 2 support people in the waiting area - these visitors may rotate.    Children under the age of 58 must have an adult with them who is not the patient.  Due to an increase in RSV and influenza rates and associated hospitalizations, children ages 51 and under may not visit patients in [facility] hospitals.  Visitors with respiratory illnesses are discouraged from visiting and should remain at home.  If the patient needs to stay at the hospital during part of their recovery, the visitor guidelines for inpatient rooms apply. Pre-op nurse will coordinate an appropriate time for 1 support person to accompany patient in pre-op.  This support person may not rotate.    Please refer to the [facility] website for the visitor guidelines for Inpatients (after your surgery is over and you are in a regular room).       Your procedure is scheduled on: 07-09-23   Report to [facility] Main Entrance    Report to admitting at      0850  AM   Call this number if you have problems the morning of surgery [phone]   Do not eat food :After Midnight.   After Midnight you may have the following liquids until __0620  ____ AM/  DAY OF SURGERY   then nothing by mouth  Water Non-Citrus Juices (without pulp, NO RED-Apple, White grape, White cranberry) Black Coffee (NO MILK/CREAM OR CREAMERS, sugar ok)  Clear Tea (NO MILK/CREAM OR CREAMERS, sugar ok) regular and decaf                             Plain Jell-O (NO RED)                                           Fruit ices (not with fruit pulp, NO RED)                                     Popsicles (NO RED)                                                               Sports drinks like Gatorade (NO RED)                     The day of surgery:  Drink ONE (1) Pre-Surgery Clear Ensure or G2 BY 0620   AM the morning of surgery. Drink in one sitting. Do not  sip.  This drink was given to you during your hospital  pre-op appointment visit. Nothing else to drink after completing the  Pre-Surgery Clear Ensure or G2.          If you have questions, please contact your surgeon's office.   FOLLOW ANY ADDITIONAL PRE OP INSTRUCTIONS YOU RECEIVED FROM YOUR SURGEON'S OFFICE!!!     Oral Hygiene is also important to reduce your risk of infection.  Remember - BRUSH YOUR TEETH THE MORNING OF SURGERY WITH YOUR REGULAR TOOTHPASTE  DENTURES WILL BE REMOVED PRIOR TO SURGERY PLEASE DO NOT APPLY "Poly grip" OR ADHESIVES!!!   Do NOT smoke after Midnight   Stop all vitamins and herbal supplements 7 days before surgery.   Take these medicines the morning of surgery with A SIP OF WATER: amlodipine, tylenol if needed, atorvastatin  DO NOT TAKE ANY ORAL DIABETIC MEDICATIONS DAY OF YOUR SURGERY  These are anesthesia recommendations for holding your anticoagulants.  Please contact your prescribing physician to confirm IF it is safe to hold your anticoagulants for this length of time.   Eliquis Apixaban   72 hours   Xarelto Rivaroxaban   72 hours  Plavix Clopidogrel   120 hours  Pletal Cilostazol   120 hours    Bring CPAP mask and tubing day of surgery.                              You may not have any metal on your body including hair pins, jewelry, and body piercing             Do not wear, lotions, powders, perfumes/cologne, or deodorant               Men may shave face and neck.   Do not bring valuables to the hospital. [facility] IS NOT             RESPONSIBLE   FOR VALUABLES.   Contacts, glasses, dentures or bridgework may not be worn into surgery.   Bring small overnight bag day of surgery.   DO NOT BRING YOUR HOME MEDICATIONS TO THE HOSPITAL. PHARMACY WILL DISPENSE MEDICATIONS LISTED ON YOUR MEDICATION LIST TO YOU DURING YOUR ADMISSION IN THE HOSPITAL!    Patients discharged on the day of surgery will  not be allowed to drive home.  Someone NEEDS to stay with you for the first 24 hours after anesthesia.   Special Instructions: Bring a copy of your healthcare power of attorney and living will documents the day of surgery if you haven't scanned them before.              Please read over the following fact sheets you were given: IF YOU HAVE QUESTIONS ABOUT YOUR PRE-OP INSTRUCTIONS PLEASE CALL [phone]  If you test positive for Covid or have been in contact with anyone that has tested positive in the last 10 days please notify you surgeon.   [facility]- Preparing for Total Shoulder Arthroplasty    Before surgery, you can play an important role. Because skin is not sterile, your skin needs to be as free of germs as possible. You can reduce the number of germs on your skin by using the following products. Benzoyl Peroxide Gel Reduces the number of germs present on the skin Applied twice a day to shoulder area starting two days before surgery    ==================================================================  Please follow these instructions carefully:  BENZOYL PEROXIDE 5% GEL  Please do not use if you have an allergy to benzoyl peroxide.   If your skin becomes reddened/irritated stop using the benzoyl peroxide.  Starting two days before surgery, apply as follows: Apply benzoyl peroxide in the morning and at night. Apply after taking a shower. If you are not taking a shower clean entire shoulder front, back, and side along with the armpit with a clean wet washcloth.  Place a quarter-sized  dollop on your shoulder and rub in thoroughly, making sure to cover the front, back, and side of your shoulder, along with the armpit.   2 days before ____ AM   ____ PM              1 day before ____ AM   ____ PM                         Do this twice a day for two days.  (Last application is the night before surgery, AFTER using the CHG soap as described below).  Do NOT apply benzoyl peroxide gel  on the day of surgery.     Pre-operative 5 CHG Bath Instructions   You can play a key role in reducing the risk of infection after surgery. Your skin needs to be as free of germs as possible. You can reduce the number of germs on your skin by washing with CHG (chlorhexidine gluconate) soap before surgery. CHG is an antiseptic soap that kills germs and continues to kill germs even after washing.   DO NOT use if you have an allergy to chlorhexidine/CHG or antibacterial soaps. If your skin becomes reddened or irritated, stop using the CHG and notify one of our RNs at [phone].   Please shower with the CHG soap starting 4 days before surgery using the following schedule:     Please keep in mind the following:  DO NOT shave, including legs and underarms, starting the day of your first shower.   You may shave your face at any point before/day of surgery.  Place clean sheets on your bed the day you start using CHG soap. Use a clean washcloth (not used since being washed) for each shower. DO NOT sleep with pets once you start using the CHG.   CHG Shower Instructions:  If you choose to wash your hair and private area, wash first with your normal shampoo/soap.  After you use shampoo/soap, rinse your hair and body thoroughly to remove shampoo/soap residue.  Turn the water OFF and apply about 3 tablespoons (45 ml) of CHG soap to a CLEAN washcloth.  Apply CHG soap ONLY FROM YOUR NECK DOWN TO YOUR TOES (washing for 3-5 minutes)  DO NOT use CHG soap on face, private areas, open wounds, or sores.  Pay special attention to the area where your surgery is being performed.  If you are having back surgery, having someone wash your back for you may be helpful. Wait 2 minutes after CHG soap is applied, then you may rinse off the CHG soap.  Pat dry with a clean towel  Put on clean clothes/pajamas   If you choose to wear lotion, please use ONLY the CHG-compatible lotions on the back of this paper.      Additional instructions for the day of surgery: DO NOT APPLY any lotions, deodorants, cologne, or perfumes.   Put on clean/comfortable clothes.  Brush your teeth.  Ask your nurse before applying any prescription medications to the skin.      CHG Compatible Lotions   Aveeno Moisturizing lotion  Cetaphil Moisturizing Cream  Cetaphil Moisturizing Lotion  Clairol Herbal Essence Moisturizing Lotion, Dry Skin  Clairol Herbal Essence Moisturizing Lotion, Extra Dry Skin  Clairol Herbal Essence Moisturizing Lotion, Normal Skin  Curel Age Defying Therapeutic Moisturizing Lotion with Alpha Hydroxy  Curel Extreme Care Body Lotion  Curel Soothing Hands Moisturizing Hand Lotion  Curel Therapeutic Moisturizing Cream, Fragrance-Free  Curel Therapeutic  Moisturizing Lotion, Fragrance-Free  Curel Therapeutic Moisturizing Lotion, Original Formula  Eucerin Daily Replenishing Lotion  Eucerin Dry Skin Therapy Plus Alpha Hydroxy Crme  Eucerin Dry Skin Therapy Plus Alpha Hydroxy Lotion  Eucerin Original Crme  Eucerin Original Lotion  Eucerin Plus Crme Eucerin Plus Lotion  Eucerin TriLipid Replenishing Lotion  Keri Anti-Bacterial Hand Lotion  Keri Deep Conditioning Original Lotion Dry Skin Formula Softly Scented  Keri Deep Conditioning Original Lotion, Fragrance Free Sensitive Skin Formula  Keri Lotion Fast Absorbing Fragrance Free Sensitive Skin Formula  Keri Lotion Fast Absorbing Softly Scented Dry Skin Formula  Keri Original Lotion  Keri Skin Renewal Lotion Keri Silky Smooth Lotion  Keri Silky Smooth Sensitive Skin Lotion  Nivea Body Creamy Conditioning Oil  Nivea Body Extra Enriched Lotion  Nivea Body Original Lotion  Nivea Body Sheer Moisturizing Lotion Nivea Crme  Nivea Skin Firming Lotion  NutraDerm 30 Skin Lotion  NutraDerm Skin Lotion  NutraDerm Therapeutic Skin Cream  NutraDerm Therapeutic Skin Lotion  ProShield Protective Hand Cream  Provon moisturizing lotion    Incentive  Spirometer  An incentive spirometer is a tool that can help keep your lungs clear and active. This tool measures how well you are filling your lungs with each breath. Taking long deep breaths may help reverse or decrease the chance of developing breathing (pulmonary) problems (especially infection) following: A long period of time when you are unable to move or be active. BEFORE THE PROCEDURE  If the spirometer includes an indicator to show your best effort, your nurse or respiratory therapist will set it to a desired goal. If possible, sit up straight or lean slightly forward. Try not to slouch. Hold the incentive spirometer in an upright position. INSTRUCTIONS FOR USE  Sit on the edge of your bed if possible, or sit up as far as you can in bed or on a chair. Hold the incentive spirometer in an upright position. Breathe out normally. Place the mouthpiece in your mouth and seal your lips tightly around it. Breathe in slowly and as deeply as possible, raising the piston or the ball toward the top of the column. Hold your breath for 3-5 seconds or for as long as possible. Allow the piston or ball to fall to the bottom of the column. Remove the mouthpiece from your mouth and breathe out normally. Rest for a few seconds and repeat Steps 1 through 7 at least 10 times every 1-2 hours when you are awake. Take your time and take a few normal breaths between deep breaths. The spirometer may include an indicator to show your best effort. Use the indicator as a goal to work toward during each repetition. After each set of 10 deep breaths, practice coughing to be sure your lungs are clear. If you have an incision (the cut made at the time of surgery), support your incision when coughing by placing a pillow or rolled up towels firmly against it. Once you are able to get out of bed, walk around indoors and cough well. You may stop using the incentive spirometer when instructed by your caregiver.  RISKS AND  COMPLICATIONS Take your time so you do not get dizzy or light-headed. If you are in pain, you may need to take or ask for pain medication before doing incentive spirometry. It is harder to take a deep breath if you are having pain. AFTER USE Rest and breathe slowly and easily. It can be helpful to keep track of a log of your progress. Your caregiver  can provide you with a simple table to help with this. If you are using the spirometer at home, follow these instructions: SEEK MEDICAL CARE IF:  You are having difficultly using the spirometer. You have trouble using the spirometer as often as instructed. Your pain medication is not giving enough relief while using the spirometer. You develop fever of 100.5 F (38.1 C) or higher. SEEK IMMEDIATE MEDICAL CARE IF:  You cough up bloody sputum that had not been present before. You develop fever of 102 F (38.9 C) or greater. You develop worsening pain at or near the incision site. MAKE SURE YOU:  Understand these instructions. Will watch your condition. Will get help right away if you are not doing well or get worse. Document Released: 08/18/2006 Document Revised: 06/30/2011 Document Reviewed: 10/19/2006 [facility] Patient Information 2014 [place], [place].   ________________________________________________________________________

## 2023-07-02 NOTE — Progress Notes (Addendum)
 PCP - Martha Clan , MD 05-19-23 preop eval CE Cardiologist -Dr. Sharrell Ku , MD Clearance Bailey Mech, NP in epic 06-12-23   PPM/ICD -  Device Orders -  Rep Notified -   Chest x-Rodney - 05-19-23 on chart EKG - 12-15-22 EPIC Stress Test - 2017 ECHO - 05-20-22 EPIC Cardiac Cath -   Sleep Study -  CPAP -   Fasting Blood Sugar -  Checks Blood Sugar _____ times a day  Blood Thinner Instructions:Eliquis hold 3 days 07-05-23 take morning dose only. Aspirin Instructions:  ERAS Protcol - PRE-SURGERY  G2-    COVID vaccine -yes  Activity--Can complete ADL's without CP or SOB  . Mild SOB with stairs  Still plays tennis and golf  Anesthesia review: Afib, HTN,   Patient denies shortness of breath, fever, cough and chest pain at PAT appointment   All instructions explained to the patient, with a verbal understanding of the material. Patient agrees to go over the instructions while at home for a better understanding. Patient also instructed to self quarantine after being tested for COVID-19. The opportunity to ask questions was provided.

## 2023-07-03 ENCOUNTER — Encounter (HOSPITAL_COMMUNITY): Payer: Self-pay

## 2023-07-03 ENCOUNTER — Other Ambulatory Visit: Payer: Self-pay

## 2023-07-03 ENCOUNTER — Encounter (HOSPITAL_COMMUNITY)
Admission: RE | Admit: 2023-07-03 | Discharge: 2023-07-03 | Disposition: A | Discharge status: Home / Self Care | Payer: PPO | Source: Ambulatory Visit | Attending: Orthopedic Surgery | Admitting: Orthopedic Surgery

## 2023-07-03 VITALS — BP 145/92 | HR 59 | Temp 97.7°F | Resp 16 | Ht 70.0 in | Wt 203.0 lb

## 2023-07-03 DIAGNOSIS — Z7901 Long term (current) use of anticoagulants: Secondary | ICD-10-CM | POA: Insufficient documentation

## 2023-07-03 DIAGNOSIS — Z01818 Encounter for other preprocedural examination: Secondary | ICD-10-CM

## 2023-07-03 DIAGNOSIS — I10 Essential (primary) hypertension: Secondary | ICD-10-CM | POA: Insufficient documentation

## 2023-07-03 DIAGNOSIS — I4821 Permanent atrial fibrillation: Secondary | ICD-10-CM | POA: Insufficient documentation

## 2023-07-03 DIAGNOSIS — Z01812 Encounter for preprocedural laboratory examination: Secondary | ICD-10-CM | POA: Diagnosis not present

## 2023-07-03 DIAGNOSIS — G4730 Sleep apnea, unspecified: Secondary | ICD-10-CM | POA: Insufficient documentation

## 2023-07-03 DIAGNOSIS — M19012 Primary osteoarthritis, left shoulder: Secondary | ICD-10-CM | POA: Diagnosis not present

## 2023-07-03 DIAGNOSIS — M75102 Unspecified rotator cuff tear or rupture of left shoulder, not specified as traumatic: Secondary | ICD-10-CM | POA: Insufficient documentation

## 2023-07-03 DIAGNOSIS — Z8546 Personal history of malignant neoplasm of prostate: Secondary | ICD-10-CM | POA: Insufficient documentation

## 2023-07-03 HISTORY — DX: Other complications of anesthesia, initial encounter: T88.59XA

## 2023-07-03 HISTORY — DX: Sleep apnea, unspecified: G47.30

## 2023-07-03 LAB — CBC
HCT: 46.6 % (ref 39.0–52.0)
Hemoglobin: 14.7 g/dL (ref 13.0–17.0)
MCH: 30.4 pg (ref 26.0–34.0)
MCHC: 31.5 g/dL (ref 30.0–36.0)
MCV: 96.3 fL (ref 80.0–100.0)
Platelets: 215 10*3/uL (ref 150–400)
RBC: 4.84 MIL/uL (ref 4.22–5.81)
RDW: 14.2 % (ref 11.5–15.5)
WBC: 9.2 10*3/uL (ref 4.0–10.5)
nRBC: 0 % (ref 0.0–0.2)

## 2023-07-03 LAB — BASIC METABOLIC PANEL
Anion gap: 8 (ref 5–15)
BUN: 32 mg/dL — ABNORMAL HIGH (ref 8–23)
CO2: 22 mmol/L (ref 22–32)
Calcium: 8.8 mg/dL — ABNORMAL LOW (ref 8.9–10.3)
Chloride: 108 mmol/L (ref 98–111)
Creatinine, Ser: 1.11 mg/dL (ref 0.61–1.24)
GFR, Estimated: >60 mL/min (ref >60)
Glucose, Bld: 99 mg/dL (ref 70–99)
Potassium: 4 mmol/L (ref 3.5–5.1)
Sodium: 138 mmol/L (ref 135–145)

## 2023-07-03 LAB — SURGICAL PCR SCREEN
MRSA, PCR: NEGATIVE
Staphylococcus aureus: NEGATIVE

## 2023-07-06 NOTE — Progress Notes (Unsigned)
 Marland Kitchen

## 2023-07-06 NOTE — Progress Notes (Signed)
 Anesthesia Chart Review   Case: [id number] Date/Time: 07/09/23 0908   Procedure: ARTHROPLASTY, SHOULDER, TOTAL, REVERSE (Left: Shoulder)   Anesthesia type: Choice   Pre-op diagnosis: LEFT SHOULDER ARTHRITIS WITH ROTATOR CUFF DISEASE   Location: [facility] ROOM 07 / [facility]   Surgeons: Jones Broom, MD       DISCUSSION:83 y.o. former smoker with h/o sHTN, sleep apnea, atrial fibrillation, prostate cancer s/p prostatectomy, left shoulder OA scheduled for above procedure 07/09/2023 with Dr. Jones Broom.   Per cardiology preoperative evaluation 06/12/23, "Preoperative Cardiovascular Risk Assessment: According to the Revised Cardiac Risk Index (RCRI), his Perioperative Risk of Major Cardiac Event is (%): 0.9   His Functional Capacity in METs is: 9.56 according to the Duke Activity Status Index (DASI).    The patient was advised that if he develops new symptoms prior to surgery to contact our office to arrange for a follow-up visit, and he verbalized understanding.   Okay to hold Eliquis for 3 days prior to procedure.  Does not need Lovenox bridge "  Pt reports last dose of Eliquis 07/05/2023.  VS: BP (!) 145/92   Pulse (!) 59   Temp 36.5 C (Oral)   Resp 16   Ht 5\' 10"  (1.778 m)   Wt 92.1 kg   SpO2 98%   BMI 29.13 kg/m   PROVIDERS: Cleatis Polka., MD is PCP   Primary Cardiologist:  Dr. Sharrell Ku  LABS: Labs reviewed: Acceptable for surgery. (all labs ordered are listed, but only abnormal results are displayed)  Labs Reviewed  BASIC METABOLIC PANEL - Abnormal; Notable for the following components:      Result Value   BUN 32 (*)    Calcium 8.8 (*)    All other components within normal limits  SURGICAL PCR SCREEN  CBC     IMAGES:   EKG:   CV: Echo 05/20/2022 1. Left ventricular ejection fraction, by estimation, is 60 to 65%. The  left ventricle has normal function. The left ventricle has no regional  wall motion abnormalities. There is mild left ventricular  hypertrophy.  Left ventricular diastolic parameters  are indeterminate.   2. Right ventricular systolic function is normal. The right ventricular  size is mildly enlarged. There is moderately elevated pulmonary artery  systolic pressure. The estimated right ventricular systolic pressure is  50.5 mmHg.   3. Left atrial size was severely dilated.   4. Right atrial size was severely dilated.   5. The mitral valve is grossly normal. Trivial mitral valve  regurgitation. No evidence of mitral stenosis.   6. The aortic valve is abnormal. There is moderate calcification of the  aortic valve. Aortic valve regurgitation is trivial. Aortic valve  sclerosis/calcification is present, without any evidence of aortic  stenosis.   7. Aortic dilatation noted. There is mild dilatation of the ascending  aorta, measuring 42 mm.   8. The inferior vena cava is dilated in size with >50% respiratory  variability, suggesting right atrial pressure of 8 mmHg.  Past Medical History:  Diagnosis Date   Atrial fibrillation (HCC)    takes aspirin for this   Brachial plexus injury 1995   Left arm   Complication of anesthesia    L arm brachial was stretched and Left arm was paralyzed for 4 months   Dysrhythmia 2009   A-fib   HTN (hypertension)    6 years   Injury of brachial plexus    Prostate cancer (HCC)    Prostate cancer (HCC)  Sleep apnea    Cpap   Torn rotator cuff    Torn rotator cuff 2000   Right arm    Past Surgical History:  Procedure Laterality Date   ETT  2006   High fitness but bp of 255   Int fixation left forearm  01/19/1989   KNEE SURGERY     Arthroplasty Left   MOHS SURGERY     PROSTATECTOMY     1995   removal of basal cell     TONSILLECTOMY     TOTAL KNEE ARTHROPLASTY Right 08/09/2019   Procedure: TOTAL KNEE ARTHROPLASTY;  Surgeon: Durene Romans, MD;  Location: [facility];  Service: Orthopedics;  Laterality: Right;  70 mins    MEDICATIONS:  Acetaminophen (TYLENOL) 325 MG CAPS    amoxicillin (AMOXIL) 500 MG capsule   apixaban (ELIQUIS) 5 MG TABS tablet   atorvastatin (LIPITOR) 10 MG tablet   hydrochlorothiazide (HYDRODIURIL) 25 MG tablet   LAGEVRIO 200 MG CAPS capsule   lisinopril (ZESTRIL) 40 MG tablet   Na Sulfate-K Sulfate-Mg Sulf 17.5-3.13-1.6 GM/177ML SOLN   tetrahydrozoline 0.05 % ophthalmic solution   No current facility-administered medications for this encounter.    Jodell Cipro Ward, PA-C [facility]-Surgical Testing [phone]

## 2023-07-07 ENCOUNTER — Encounter: Payer: Self-pay | Admitting: Neurology

## 2023-07-07 ENCOUNTER — Ambulatory Visit: Admitting: Neurology

## 2023-07-07 VITALS — BP 142/84 | HR 82 | Ht 71.0 in | Wt 208.0 lb

## 2023-07-07 DIAGNOSIS — Z7189 Other specified counseling: Secondary | ICD-10-CM | POA: Diagnosis not present

## 2023-07-07 DIAGNOSIS — I4811 Longstanding persistent atrial fibrillation: Secondary | ICD-10-CM

## 2023-07-07 DIAGNOSIS — G4739 Other sleep apnea: Secondary | ICD-10-CM

## 2023-07-07 NOTE — Patient Instructions (Signed)
   Complex sleep apnea.   BiPAP reduced  AHI by 50% but there is  still a significant central apnea residual present.    We will meet in 3.5 months to look at AHI data while you have slept in a recliner- it may reduce the AHI. 90 days download required for the RV.

## 2023-07-07 NOTE — Progress Notes (Addendum)
 Provider:  Melvyn Novas, MD  Primary Care Physician:  Cleatis Polka., MD [address] [place] [address]     Referring Provider: Cleatis Polka., Md [address] [place],  [place] [address]          Chief Complaint according to patient   Patient presents with:                HISTORY OF PRESENT ILLNESS:  Glenn [name] is a 83 y.o. male patient who is here for revisit 07/07/2023 for BiPAP,  The patient received a new BiPAP following an in-lab positive airway pressure titration from 01-27-2023.  This patient is followed by Dr. Sandra Cockayne, MD.  He has a history of atrial fibrillation persistent, of prostate cancer, radical prostatectomy, complex sleep apnea was diagnosed by home sleep test was 40% central apnea the overall degree was moderately severe.  The Epworth sleepiness score had been endorsed at 7 points fatigue severity at 24 points and the AHI was 35/h.  It was not a positional dependent form of apnea and there was no significant sleep hypoxia.  He did try an auto titration CPAP had good compliance but there were more central apneas arising and for this reason we are invited him back to a in-lab titration.  The in-lab titration first confirmed complex sleep apnea and there were equal amounts of central and obstructive events.  There was persistent atrial fibrillation seen throughout the night.  He started with a fullface mask at the bilevel titration had used a nasal mask at home and the BiPAP pressure that was benefiting him the most was 17 over 13 cm water.  The reviewed the reduction in AHI lead still to a final residual of 8.3/h.  And since we used a fullface mask during the titration this was the mask we prescribed for the patient has of course a choice to change to a different model.  Chief concern according to patient :  I had initially trouble to use the BiPAP, and  just got used to it now.  I put the machine on when I watch TV , and by the  time I sleep I can make it to 4 hours or more.  Nocturia is absent now. !!  BMI 29, neck size      Social HX : Engineer, production, Public relations account executive. Both parents taught at [employer],      Review of Systems: Out of a complete 14 system review, the patient complains of only the following symptoms, and all other reviewed systems are negative.:    ESS 6/ 24 and FSS at 20/ 63.  None elevated.   Social History   Socioeconomic History   Marital status: Married    Spouse name: Not on file   Number of children: 3   Years of education: Not on file   Highest education level: Not on file  Occupational History   Occupation: Engineer, drilling: Retired  Tobacco Use   Smoking status: Former    Current packs/day: 0.00    Average packs/day: 0.5 packs/day for 15.0 years (7.5 ttl pk-yrs)    Types: Cigarettes    Start date: 04/21/1970    Quit date: 04/21/1985    Years since quitting: 38.2   Smokeless tobacco: Never  Vaping Use   Vaping status: Never Used  Substance and Sexual Activity   Alcohol use: Yes    Comment: Social  Drug use: No   Sexual activity: Not Currently  Other Topics Concern   Not on file  Social History Narrative   works as Veterinary surgeon;    active Media planner played BB at [employer] 1964 after 2 final fours   married to [place]; 2 male and 1 male children;    multiple grandkids;    non smoker - quit 25 years ago   social ETOH   Social Drivers of Corporate investment banker Strain: Not on file  Food Insecurity: Not on file  Transportation Needs: Not on file  Physical Activity: Not on file  Stress: Not on file  Social Connections: Not on file    Family History  Problem Relation Age of Onset   Heart attack Father        MI   Obesity Brother    Parkinsonism Brother    Prostate cancer Brother    Colon cancer Neg Hx    Esophageal cancer Neg Hx    Stomach cancer Neg Hx    Rectal cancer Neg Hx     Past Medical History:  Diagnosis Date   Atrial fibrillation (HCC)     takes aspirin for this   Brachial plexus injury 1995   Left arm   Complication of anesthesia    L arm brachial was stretched and Left arm was paralyzed for 4 months   Dysrhythmia 2009   A-fib   HTN (hypertension)    6 years   Injury of brachial plexus    Prostate cancer (HCC)    Prostate cancer (HCC)    Sleep apnea    Cpap   Torn rotator cuff    Torn rotator cuff 2000   Right arm    Past Surgical History:  Procedure Laterality Date   ETT  2006   High fitness but bp of 255   Int fixation left forearm  01/19/1989   KNEE SURGERY     Arthroplasty Left   MOHS SURGERY     PROSTATECTOMY     1995   removal of basal cell     TONSILLECTOMY     TOTAL KNEE ARTHROPLASTY Right 08/09/2019   Procedure: TOTAL KNEE ARTHROPLASTY;  Surgeon: Durene Romans, MD;  Location: [facility];  Service: Orthopedics;  Laterality: Right;  70 mins     Current Outpatient Medications on File Prior to Visit  Medication Sig Dispense Refill   Acetaminophen (TYLENOL) 325 MG CAPS Take 325 mg by mouth as needed (pain).     amoxicillin (AMOXIL) 500 MG capsule Take 2,000 mg by mouth See admin instructions. Take 4 capsules (2000 mg) by mouth 1 hour prior to dental procedures.     apixaban (ELIQUIS) 5 MG TABS tablet TAKE 1 TABLET BY MOUTH TWICE A DAY 180 tablet 0   atorvastatin (LIPITOR) 10 MG tablet TAKE 1 TABLET BY MOUTH DAILY 90 tablet 3   hydrochlorothiazide (HYDRODIURIL) 25 MG tablet TAKE 1 TABLET BY MOUTH DAILY 90 tablet 3   lisinopril (ZESTRIL) 40 MG tablet TAKE 1 TABLET BY MOUTH DAILY 90 tablet 3   tetrahydrozoline 0.05 % ophthalmic solution Place 1-2 drops into both eyes daily as needed (dry/irritated eyes.).     No current facility-administered medications on file prior to visit.    No Known Allergies   DIAGNOSTIC DATA (LABS, IMAGING, TESTING) - I reviewed patient records, labs, notes, testing and imaging myself where available.  Lab Results  Component Value Date   WBC 9.2 07/03/2023   HGB 14.7  07/03/2023  HCT 46.6 07/03/2023   MCV 96.3 07/03/2023   PLT 215 07/03/2023      Component Value Date/Time   NA 138 07/03/2023 1016   NA 141 08/04/2017 1047   K 4.0 07/03/2023 1016   CL 108 07/03/2023 1016   CO2 22 07/03/2023 1016   GLUCOSE 99 07/03/2023 1016   BUN 32 (H) 07/03/2023 1016   BUN 19 08/04/2017 1047   CREATININE 1.11 07/03/2023 1016   CALCIUM 8.8 (L) 07/03/2023 1016   CALCIUM 9.2 05/19/2023 0000   PROT 6.7 07/29/2019 1337   PROT 6.4 07/08/2016 0842   ALBUMIN 3.8 07/29/2019 1337   ALBUMIN 4.0 07/08/2016 0842   AST 24 07/29/2019 1337   ALT 23 07/29/2019 1337   ALKPHOS 66 07/29/2019 1337   BILITOT 1.0 07/29/2019 1337   BILITOT 0.7 07/08/2016 0842   GFRNONAA >60 07/03/2023 1016   GFRAA >60 08/10/2019 0323   Lab Results  Component Value Date   CHOL 170 07/08/2016   HDL 61 07/08/2016   LDLCALC 101 (H) 07/08/2016   TRIG 42 07/08/2016   CHOLHDL 2.8 07/08/2016   Lab Results  Component Value Date   HGBA1C 6.0 (H) 08/09/2019   No results found for: "VITAMINB12" No results found for: "TSH"  PHYSICAL EXAM:  Today's Vitals   07/07/23 0935  BP: (!) 142/84  Pulse: 82  Weight: 208 lb (94.3 kg)  Height: 5\' 11"  (1.803 m)   Body mass index is 29.01 kg/m.   Wt Readings from Last 3 Encounters:  07/07/23 208 lb (94.3 kg)  07/03/23 203 lb (92.1 kg)  05/18/23 190 lb (86.2 kg)     Ht Readings from Last 3 Encounters:  07/07/23 5\' 11"  (1.803 m)  07/03/23 5\' 10"  (1.778 m)  05/18/23 5\' 10"  (1.778 m)      General: The patient is awake, alert and appears not in acute distress. The patient is well groomed. Head: Normocephalic, atraumatic. Neck is supple. Mallampati 2,  neck circumference:18.25 inches . Nasal airflow  patent.  Retrognathia is not seen.  Dental status: partial  Cardiovascular:  Regular rate and cardiac rhythm by pulse,  without distended neck veins. Respiratory: Lungs are clear to auscultation.  Skin:  With evidence of ankle edema. Trunk: The  patient's posture is erect.   NEUROLOGIC EXAM: The patient is awake and alert, oriented to place and time.   Memory subjective described as intact.  Attention span & concentration ability appears impaired by hearing deficits.  Speech is fluent,  without dysarthria, dysphonia or aphasia.  Mood and affect are appropriate.   Cranial nerves: no loss of smell or taste reported  Pupils are equal and briskly reactive to light. Funduscopic exam deferred.  Extraocular movements in vertical and horizontal planes were intact and without nystagmus.  No Diplopia. Visual fields by finger perimetry are intact. Hearing was severely  impaired- bilaterally  hearing aids present.     Facial sensation intact to fine touch.  Facial motor strength is symmetric and tongue and uvula move midline.  Neck ROM : rotation, tilt and flexion extension were normal for age and shoulder shrug was symmetrical.    Motor exam:  Symmetric bulk, tone and ROM.   Elevated base tone without cog -wheeling, protective ? There is left lesser  grip strength .   Sensory:  Vibration was felt reduced at either ankle.   Coordination: Rapid alternating movements in the fingers/hands were of normal speed.  The Finger-to-nose maneuver was intact without evidence of ataxia, dysmetria or  tremor.   Gait and station: Patient could rise unassisted from a seated position, walked without assistive device.  Deep tendon reflexes: in the  upper extremities are symmetric and intact.  Patella is reduced, status post total knee.  Babinski response was deferred.    ASSESSMENT AND PLAN 83 y.o. year old male  here with:    1) Complex Sleep Apnea in the setting of persistent atrial fibrillation- this is usually a sign  associated with diastolic HF-  , this is almost 50/ 50 central to obstructive events.  Now on BIPAP his residual AHI is  15/ h a reduction by 50% , I had hoped for more-   1) there is still a lot of air leakage and I wonder if the  new FFM is not the best fit fr or him. 2)  This is an auto BiPAP , yet it has failed to address the central apnea effectively, 9/h are central apneas, there are not more than 2/h of obstructive events. We may need ASV in the future.   3)  He feels better rested. He is expecting to have shoulder surgery on Thursday  next , Left shoulder - it may have impact on his dexterity and compliance after that, he will sleep in a recliner at home after the procedure, this will likely help his apnea ! And allow the FFM  to stay in place .    For now, keeping use of biPAP, we will meet after his recovery again and see 90 day data .  I plan to follow up either personally or through our NP within 3.5 months.   I would like to thank Cleatis Polka., MD and Cleatis Polka., Md [address] [place],  [place] [address] for allowing me to meet with and to take care of this pleasant patient.   CC: I will share my notes with Dr Ave Filter, Orthopedist.   After spending a total time of  35  minutes face to face and additional time for physical and neurologic examination, review of laboratory studies,  personal review of imaging studies, reports and results of other testing and review of referral information / records as far as provided in visit,   Electronically signed by: Melvyn Novas, MD 07/07/2023 9:47 AM  [facility] Board certified by The [employer] of Sleep Medicine and Diplomate of the [employer] of Sleep Medicine. Board certified In Neurology through the ABPN, Fellow of the [employer] of Neurology.

## 2023-07-08 NOTE — Discharge Instructions (Signed)
Discharge Instructions after Reverse Total Shoulder Arthroplasty ? ? ?A sling has been provided for you. You are to wear this at all times (except for bathing and dressing), until your first post operative visit with Dr. Tamera Punt. Please also wear while sleeping at night. While you bath and dress, let the arm/elbow extend straight down to stretch your elbow. Wiggle your fingers and pump your first while your in the sling to prevent hand swelling. ?Use ice on the shoulder intermittently over the first 48 hours after surgery. Continue to use ice or and ice machine as needed after 48 hours for pain control/swelling.  ?Pain medicine has been prescribed for you.  ?Use your medicine liberally over the first 48 hours, and then you can begin to taper your use. You may take Extra Strength Tylenol or Tylenol only in place of the pain pills. DO NOT take ANY nonsteroidal anti-inflammatory pain medications: Advil, Motrin, Ibuprofen, Aleve, Naproxen or Naprosyn.  ?Resume Eliquis the day after surgery ?Leave your dressing on until your first follow up visit.  You may shower with the dressing.  Hold your arm as if you still have your sling on while you shower. ?Simply allow the water to wash over the site and then pat dry. Make sure your axilla (armpit) is completely dry after showering. ? ? ? ?Please call [phone] during normal business hours or [phone] after hours for any problems. Including the following: ? ?- excessive redness of the incisions ?- drainage for more than 4 days ?- fever of more than 101.5 F ? ?*Please note that pain medications will not be refilled after hours or on weekends. ? ? ? Dental Antibiotics: ? ?In most cases prophylactic antibiotics for Dental procdeures after total joint surgery are not necessary. ? ?Exceptions are as follows: ? ?1. History of prior total joint infection ? ?2. Severely immunocompromised (Organ Transplant, cancer chemotherapy, Rheumatoid biologic ?meds such as [place]) ? ?3.  Poorly controlled diabetes (A1C &gt; 8.0, blood glucose over 200) ? ?If you have one of these conditions, contact your surgeon for an antibiotic prescription, prior to your ?dental procedure.  ?

## 2023-07-09 ENCOUNTER — Ambulatory Visit (HOSPITAL_BASED_OUTPATIENT_CLINIC_OR_DEPARTMENT_OTHER): Admitting: Registered Nurse

## 2023-07-09 ENCOUNTER — Other Ambulatory Visit: Payer: Self-pay

## 2023-07-09 ENCOUNTER — Ambulatory Visit (HOSPITAL_COMMUNITY): Payer: Self-pay | Admitting: Physician Assistant

## 2023-07-09 ENCOUNTER — Encounter (HOSPITAL_COMMUNITY)
Admission: RE | Disposition: A | Discharge status: Home / Self Care | Payer: Self-pay | Source: Home / Self Care | Attending: Orthopedic Surgery

## 2023-07-09 ENCOUNTER — Ambulatory Visit (HOSPITAL_COMMUNITY)
Admission: RE | Admit: 2023-07-09 | Discharge: 2023-07-09 | Disposition: A | Discharge status: Home / Self Care | Payer: PPO | Attending: Orthopedic Surgery | Admitting: Orthopedic Surgery

## 2023-07-09 ENCOUNTER — Encounter (HOSPITAL_COMMUNITY): Payer: Self-pay | Admitting: Orthopedic Surgery

## 2023-07-09 DIAGNOSIS — M19012 Primary osteoarthritis, left shoulder: Secondary | ICD-10-CM | POA: Insufficient documentation

## 2023-07-09 DIAGNOSIS — M75102 Unspecified rotator cuff tear or rupture of left shoulder, not specified as traumatic: Secondary | ICD-10-CM

## 2023-07-09 DIAGNOSIS — Z87891 Personal history of nicotine dependence: Secondary | ICD-10-CM | POA: Diagnosis not present

## 2023-07-09 DIAGNOSIS — I10 Essential (primary) hypertension: Secondary | ICD-10-CM | POA: Insufficient documentation

## 2023-07-09 DIAGNOSIS — I4891 Unspecified atrial fibrillation: Secondary | ICD-10-CM

## 2023-07-09 DIAGNOSIS — Z96612 Presence of left artificial shoulder joint: Secondary | ICD-10-CM | POA: Diagnosis not present

## 2023-07-09 DIAGNOSIS — G4730 Sleep apnea, unspecified: Secondary | ICD-10-CM | POA: Insufficient documentation

## 2023-07-09 DIAGNOSIS — Z7901 Long term (current) use of anticoagulants: Secondary | ICD-10-CM | POA: Diagnosis not present

## 2023-07-09 DIAGNOSIS — I700 Atherosclerosis of aorta: Secondary | ICD-10-CM | POA: Diagnosis not present

## 2023-07-09 HISTORY — PX: REVERSE SHOULDER ARTHROPLASTY: SHX5054

## 2023-07-09 SURGERY — ARTHROPLASTY, SHOULDER, TOTAL, REVERSE
Anesthesia: General | Site: Shoulder | Laterality: Left

## 2023-07-09 MED ORDER — TRANEXAMIC ACID-NACL 1000-0.7 MG/100ML-% IV SOLN
1000.0000 mg | INTRAVENOUS | Status: AC
  Administered 2023-07-09: 1000 mg via INTRAVENOUS
  Filled 2023-07-09: qty 100

## 2023-07-09 MED ORDER — ACETAMINOPHEN 160 MG/5ML PO SOLN: 1000.0000 mg | Freq: Once | ORAL | Status: DC | PRN

## 2023-07-09 MED ORDER — ACETAMINOPHEN 10 MG/ML IV SOLN
INTRAVENOUS | Status: AC
  Filled 2023-07-09: qty 100

## 2023-07-09 MED ORDER — FENTANYL CITRATE (PF) 100 MCG/2ML IJ SOLN
INTRAMUSCULAR | Status: DC | PRN
  Administered 2023-07-09 (×3): 50 ug via INTRAVENOUS

## 2023-07-09 MED ORDER — HYDRALAZINE HCL 20 MG/ML IJ SOLN
10.0000 mg | INTRAMUSCULAR | Status: DC | PRN
  Administered 2023-07-09: 10 mg via INTRAVENOUS

## 2023-07-09 MED ORDER — WATER FOR IRRIGATION, STERILE IR SOLN
Status: DC | PRN
  Administered 2023-07-09: 1000 mL

## 2023-07-09 MED ORDER — ACETAMINOPHEN 500 MG PO TABS: 1000.0000 mg | ORAL_TABLET | Freq: Once | ORAL | Status: DC | PRN

## 2023-07-09 MED ORDER — FENTANYL CITRATE (PF) 100 MCG/2ML IJ SOLN
INTRAMUSCULAR | Status: AC
  Filled 2023-07-09: qty 2

## 2023-07-09 MED ORDER — OXYCODONE HCL 5 MG PO TABS
ORAL_TABLET | ORAL | Status: AC
  Filled 2023-07-09: qty 1

## 2023-07-09 MED ORDER — SUGAMMADEX SODIUM 200 MG/2ML IV SOLN
INTRAVENOUS | Status: AC
  Filled 2023-07-09: qty 2

## 2023-07-09 MED ORDER — PHENYLEPHRINE HCL-NACL 20-0.9 MG/250ML-% IV SOLN
INTRAVENOUS | Status: DC | PRN
  Administered 2023-07-09: 25 ug/min via INTRAVENOUS

## 2023-07-09 MED ORDER — LABETALOL HCL 5 MG/ML IV SOLN
INTRAVENOUS | Status: DC | PRN
  Administered 2023-07-09: 2.5 mg via INTRAVENOUS

## 2023-07-09 MED ORDER — KETAMINE HCL 50 MG/5ML IJ SOSY
PREFILLED_SYRINGE | INTRAMUSCULAR | Status: DC | PRN
  Administered 2023-07-09: 40 mg via INTRAVENOUS

## 2023-07-09 MED ORDER — ROCURONIUM BROMIDE 10 MG/ML (PF) SYRINGE
PREFILLED_SYRINGE | INTRAVENOUS | Status: AC
  Filled 2023-07-09: qty 10

## 2023-07-09 MED ORDER — ROCURONIUM BROMIDE 10 MG/ML (PF) SYRINGE
PREFILLED_SYRINGE | INTRAVENOUS | Status: DC | PRN
  Administered 2023-07-09: 50 mg via INTRAVENOUS
  Administered 2023-07-09: 30 mg via INTRAVENOUS

## 2023-07-09 MED ORDER — ACETAMINOPHEN 10 MG/ML IV SOLN
INTRAVENOUS | Status: DC | PRN
  Administered 2023-07-09: 1000 mg via INTRAVENOUS

## 2023-07-09 MED ORDER — OXYCODONE HCL 5 MG PO TABS
5.0000 mg | ORAL_TABLET | Freq: Once | ORAL | Status: AC | PRN
  Administered 2023-07-09: 5 mg via ORAL

## 2023-07-09 MED ORDER — HYDRALAZINE HCL 20 MG/ML IJ SOLN
INTRAMUSCULAR | Status: DC
Start: 2023-07-09 — End: 2023-07-09
  Filled 2023-07-09: qty 1

## 2023-07-09 MED ORDER — SODIUM CHLORIDE 0.9 % IR SOLN
Status: DC | PRN
  Administered 2023-07-09: 1000 mL

## 2023-07-09 MED ORDER — ORAL CARE MOUTH RINSE: 15.0000 mL | Freq: Once | OROMUCOSAL | Status: AC

## 2023-07-09 MED ORDER — PROPOFOL 10 MG/ML IV BOLUS
INTRAVENOUS | Status: AC
Start: 2023-07-09 — End: ?
  Filled 2023-07-09: qty 20

## 2023-07-09 MED ORDER — 0.9 % SODIUM CHLORIDE (POUR BTL) OPTIME
TOPICAL | Status: DC | PRN
  Administered 2023-07-09: 1000 mL

## 2023-07-09 MED ORDER — OXYCODONE HCL 5 MG/5ML PO SOLN: 5.0000 mg | Freq: Once | ORAL | Status: AC | PRN

## 2023-07-09 MED ORDER — SUGAMMADEX SODIUM 200 MG/2ML IV SOLN
INTRAVENOUS | Status: DC | PRN
  Administered 2023-07-09: 400 mg via INTRAVENOUS

## 2023-07-09 MED ORDER — ACETAMINOPHEN 10 MG/ML IV SOLN: 1000.0000 mg | Freq: Once | INTRAVENOUS | Status: DC | PRN

## 2023-07-09 MED ORDER — KETAMINE HCL 50 MG/5ML IJ SOSY
PREFILLED_SYRINGE | INTRAMUSCULAR | Status: AC
  Filled 2023-07-09: qty 5

## 2023-07-09 MED ORDER — ONDANSETRON HCL 4 MG/2ML IJ SOLN
INTRAMUSCULAR | Status: DC | PRN
  Administered 2023-07-09: 4 mg via INTRAVENOUS

## 2023-07-09 MED ORDER — DEXAMETHASONE SODIUM PHOSPHATE 10 MG/ML IJ SOLN
INTRAMUSCULAR | Status: AC
  Filled 2023-07-09: qty 1

## 2023-07-09 MED ORDER — MIDAZOLAM HCL 2 MG/2ML IJ SOLN: 2.0000 mg | INTRAMUSCULAR | Status: DC

## 2023-07-09 MED ORDER — FENTANYL CITRATE PF 50 MCG/ML IJ SOSY: 100.0000 ug | PREFILLED_SYRINGE | INTRAMUSCULAR | Status: DC

## 2023-07-09 MED ORDER — OXYCODONE HCL 5 MG PO TABS: 5.0000 mg | ORAL_TABLET | ORAL | 0 refills | Status: AC | PRN

## 2023-07-09 MED ORDER — LACTATED RINGERS IV SOLN: INTRAVENOUS | Status: DC | PRN

## 2023-07-09 MED ORDER — DEXAMETHASONE SODIUM PHOSPHATE 10 MG/ML IJ SOLN
INTRAMUSCULAR | Status: DC | PRN
  Administered 2023-07-09: 10 mg via INTRAVENOUS

## 2023-07-09 MED ORDER — ONDANSETRON HCL 4 MG/2ML IJ SOLN
INTRAMUSCULAR | Status: AC
  Filled 2023-07-09: qty 2

## 2023-07-09 MED ORDER — CEFAZOLIN SODIUM-DEXTROSE 2-4 GM/100ML-% IV SOLN
2.0000 g | INTRAVENOUS | Status: AC
  Administered 2023-07-09: 2 g via INTRAVENOUS
  Filled 2023-07-09: qty 100

## 2023-07-09 MED ORDER — TIZANIDINE HCL 4 MG PO TABS: 4.0000 mg | ORAL_TABLET | Freq: Three times a day (TID) | ORAL | 0 refills | Status: AC | PRN

## 2023-07-09 MED ORDER — FENTANYL CITRATE PF 50 MCG/ML IJ SOSY: 25.0000 ug | PREFILLED_SYRINGE | INTRAMUSCULAR | Status: DC | PRN

## 2023-07-09 MED ORDER — CHLORHEXIDINE GLUCONATE 0.12 % MT SOLN
15.0000 mL | Freq: Once | OROMUCOSAL | Status: AC
  Administered 2023-07-09: 15 mL via OROMUCOSAL

## 2023-07-09 SURGICAL SUPPLY — 71 items
BAG COUNTER SPONGE SURGICOUNT (BAG) IMPLANT
BAG ZIPLOCK 12X15 (MISCELLANEOUS) ×1 IMPLANT
BASEPLATE P2 COATD GLND 6.5X30 (Shoulder) IMPLANT
BIT DRILL 1.6MX128 (BIT) IMPLANT
BIT DRILL 2.5 DIA 127 CALI (BIT) IMPLANT
BIT DRILL 4 DIA CALIBRATED (BIT) IMPLANT
BLADE SAW SGTL 73X25 THK (BLADE) ×1 IMPLANT
BOOTIES KNEE HIGH SLOAN (MISCELLANEOUS) ×2 IMPLANT
COOLER ICEMAN CLASSIC (MISCELLANEOUS) ×1 IMPLANT
COVER BACK TABLE 60X90IN (DRAPES) ×1 IMPLANT
COVER SURGICAL LIGHT HANDLE (MISCELLANEOUS) ×1 IMPLANT
DRAPE INCISE IOBAN 66X45 STRL (DRAPES) ×1 IMPLANT
DRAPE POUCH INSTRU U-SHP 10X18 (DRAPES) ×1 IMPLANT
DRAPE SHEET LG 3/4 BI-LAMINATE (DRAPES) ×1 IMPLANT
DRAPE SURG 17X11 SM STRL (DRAPES) ×1 IMPLANT
DRAPE SURG ORHT 6 SPLT 77X108 (DRAPES) ×2 IMPLANT
DRAPE TOP 10253 STERILE (DRAPES) ×1 IMPLANT
DRAPE U-SHAPE 47X51 STRL (DRAPES) ×1 IMPLANT
DRSG AQUACEL AG ADV 3.5X 6 (GAUZE/BANDAGES/DRESSINGS) ×1 IMPLANT
DURAPREP 26ML APPLICATOR (WOUND CARE) ×2 IMPLANT
ELECT BLADE TIP CTD 4 INCH (ELECTRODE) ×1 IMPLANT
ELECT REM PT RETURN 15FT ADLT (MISCELLANEOUS) ×1 IMPLANT
FACESHIELD WRAPAROUND (MASK) ×1 IMPLANT
FACESHIELD WRAPAROUND OR TEAM (MASK) ×1 IMPLANT
GLOVE BIO SURGEON STRL SZ7.5 (GLOVE) ×1 IMPLANT
GLOVE BIOGEL PI IND STRL 6.5 (GLOVE) ×1 IMPLANT
GLOVE BIOGEL PI IND STRL 8 (GLOVE) ×1 IMPLANT
GLOVE SURG SS PI 6.5 STRL IVOR (GLOVE) ×1 IMPLANT
GOWN STRL REUS W/ TWL LRG LVL3 (GOWN DISPOSABLE) ×1 IMPLANT
GOWN STRL REUS W/ TWL XL LVL3 (GOWN DISPOSABLE) ×1 IMPLANT
HEAD GLENOID W/SCREW 32MM (Shoulder) IMPLANT
HOOD PEEL AWAY T7 (MISCELLANEOUS) ×3 IMPLANT
INSERT EPOLY STND HUMERUS 36MM (Shoulder) ×1 IMPLANT
INSERT EPOLYSTD HUMERUS 36MM (Shoulder) IMPLANT
KIT BASIN OR (CUSTOM PROCEDURE TRAY) ×1 IMPLANT
KIT TURNOVER KIT A (KITS) IMPLANT
MANIFOLD NEPTUNE II (INSTRUMENTS) ×1 IMPLANT
NDL TROCAR POINT SZ 2 1/2 (NEEDLE) IMPLANT
NEEDLE TROCAR POINT SZ 2 1/2 (NEEDLE) IMPLANT
NS IRRIG 1000ML POUR BTL (IV SOLUTION) ×1 IMPLANT
P2 COATDE GLNOID BSEPLT 6.5X30 (Shoulder) ×1 IMPLANT
PACK SHOULDER (CUSTOM PROCEDURE TRAY) ×1 IMPLANT
PAD COLD SHLDR WRAP-ON (PAD) ×1 IMPLANT
PROTECTOR NERVE ULNAR (MISCELLANEOUS) IMPLANT
RESTRAINT HEAD UNIVERSAL NS (MISCELLANEOUS) ×1 IMPLANT
RETRIEVER SUT HEWSON (MISCELLANEOUS) IMPLANT
SCREW BONE LOCKING RSP 5.0X30 (Screw) ×1 IMPLANT
SCREW BONE LOCKING RSP 5.0X34 (Screw) ×1 IMPLANT
SCREW BONE RSP LOCK 5X18 (Screw) IMPLANT
SCREW BONE RSP LOCK 5X30 (Screw) IMPLANT
SCREW BONE RSP LOCK 5X34 (Screw) IMPLANT
SCREW BONE RSP LOCKING 18MM LG (Screw) ×2 IMPLANT
SET HNDPC FAN SPRY TIP SCT (DISPOSABLE) ×1 IMPLANT
SLING ARM FOAM STRAP LRG (SOFTGOODS) IMPLANT
SLING ARM FOAM STRAP MED (SOFTGOODS) IMPLANT
SLING ARM IMMOBILIZER LRG (SOFTGOODS) IMPLANT
SLING ARM IMMOBILIZER MED (SOFTGOODS) IMPLANT
STEM HUMERAL STD SHELL 14X48 (Miscellaneous) IMPLANT
STRIP CLOSURE SKIN 1/2X4 (GAUZE/BANDAGES/DRESSINGS) ×1 IMPLANT
SUCTION TUBE FRAZIER 10FR DISP (SUCTIONS) IMPLANT
SUPPORT WRAP ARM LG (MISCELLANEOUS) ×1 IMPLANT
SUT ETHIBOND 2 V 37 (SUTURE) IMPLANT
SUT FIBERWIRE #2 38 REV NDL BL (SUTURE) ×1 IMPLANT
SUT MNCRL AB 4-0 PS2 18 (SUTURE) ×1 IMPLANT
SUT VIC AB 2-0 CT1 TAPERPNT 27 (SUTURE) ×2 IMPLANT
SUTURE FIBERWR#2 38 REV NDL BL (SUTURE) IMPLANT
TAP SURG THRD DJ 6.5 (ORTHOPEDIC DISPOSABLE SUPPLIES) IMPLANT
TAPE LABRALWHITE 1.5X36 (TAPE) IMPLANT
TAPE SUT LABRALTAP WHT/BLK (SUTURE) IMPLANT
TOWEL OR 17X26 10 PK STRL BLUE (TOWEL DISPOSABLE) ×1 IMPLANT
WATER STERILE IRR 1000ML POUR (IV SOLUTION) ×1 IMPLANT

## 2023-07-09 NOTE — Evaluation (Signed)
 Occupational Therapy Evaluation Patient Details Name: Glenn [name] MRN: [id number] DOB: 11-May-1940 Today's Date: 07/09/2023   History of Present Illness   83 yo s/p L Reverse TSA. PMH: Brachial Plexus injury, HTN, A-fib     Clinical Impressions PTA pt lives independently with his wife, drives and enjoys playing tennis and golf.  Education completed regarding compensatory strategies for ADL tasks and functional mobility, management of sling, LU ROM per specified parameters in the order set as indicated below, positioning of operative arm in sitting and supine and edema control, including use of "Iceman" Cold Therapy machine. Caregiver present for education, written handouts provided and reviewed using Teach Back and pt/caregiver verbalized/demonstrated understanding. Due to the below listed deficits, pt requires Min assistance with ADL tasks and S assist with functional mobility. Caregiver will be able to provide necessary level of assistance at discharge. Pt to follow up with MD to progress rehab of the operative shoulder.      If plan is discharge home, recommend the following:   A little help with walking and/or transfers;A little help with bathing/dressing/bathroom;Assistance with cooking/housework;Assist for transportation     Functional Status Assessment   Patient has had a recent decline in their functional status and demonstrates the ability to make significant improvements in function in a reasonable and predictable amount of time.     Equipment Recommendations   None recommended by OT     Recommendations for Other Services         Precautions/Restrictions   Precautions Precautions: Shoulder No shoulder ROM allowed; elbow/wrist/hand AROM only.  Shoulder Interventions: Shoulder sling/immobilizer;At all times;Off for dressing/bathing/exercises Recall of Precautions/Restrictions: Intact Required Braces or Orthoses: Sling Restrictions Weight Bearing Restrictions Per  Provider Order: Yes LUE Weight Bearing Per Provider Order: Non weight bearing     Mobility Bed Mobility          Pt plans to sleep in a recliner          Transfers Overall transfer level: Needs assistance   Transfers: Sit to/from Stand, Bed to chair/wheelchair/BSC Sit to Stand: Contact guard assist (HHA)           General transfer comment: Pt states he has difficulty at times standing up form lower chairs      Balance Overall balance assessment: Mild deficits observed, not formally tested                                         ADL either performed or assessed with clinical judgement   ADL Overall ADL's : Needs assistance/impaired        Per orders, L shoulder parameters as follows for ADL tasks: No shoulder ROM; elbow/wrist/hand ROM only. While moving within specified parameters, pt/caregiver instructed on bathing and how to donn/doff shirt, placing operative arm through sleeve first when donning and off last when doffing.Pt/caregiver educated on compensatory strategies for LB ADL and strategies to reduce risk of falls.  Pt/caregiver educated on donning/doffing sling and to wear the sling at all times with the exception of ADL, and to loosen the neck strap of the sling when the operative arm is in a supported position when sitting. In sitting or supine, pt instructed to have a pillow behind and under their operative arm to provide support. If assist needed with ambulation, caregiver educated on the importance of walking on pt's non-operative side.  Education regarding use of "IceMan" Cold  Therapy completed, including the importance of using a barrier on the shoulder prior to positioning the wrap-on pad. Pt/caregiver verbalized/demonstrated understanding. Teach Back used while caregiver assisted with dressing pt and positioning "wrap-on pad" to facilitate DC.  See shoulder section below                             Functional mobility during  ADLs: Supervision/safety General ADL Comments: see shoulder ex section     Vision         Perception         Praxis         Pertinent Vitals/Pain Pain Assessment Pain Assessment: 0-10 Pain Score: 3  Pain Location: L shoulder Pain Descriptors / Indicators: Discomfort, Aching Pain Intervention(s): Limited activity within patient's tolerance     Extremity/Trunk Assessment Upper Extremity Assessment Upper Extremity Assessment: Right hand dominant;LUE deficits/detail LUE Deficits / Details: elbow/wrist/hand AAROM WFL; no shoulder movement allowed   Lower Extremity Assessment Lower Extremity Assessment: Overall WFL for tasks assessed (lmitations i knee ROM)       Communication     Cognition Arousal: Alert Behavior During Therapy: WFL for tasks assessed/performed Cognition: No apparent impairments                               Following commands: Intact       Cueing  General Comments          Exercises Exercises: Shoulder Shoulder Exercises Elbow Flexion: AAROM, Left, 5 reps Elbow Extension: AAROM, Left, 5 reps Wrist Flexion: AAROM, Left, 5 reps Wrist Extension: AAROM, Left, 5 reps Digit Composite Flexion: AAROM, Left, 5 reps Composite Extension: AAROM, Left, 5 reps   Shoulder Instructions Shoulder Instructions Donning/doffing shirt without moving shoulder: Patient able to independently direct caregiver Method for sponge bathing under operated UE: Patient able to independently direct caregiver Donning/doffing sling/immobilizer: Patient able to independently direct caregiver Correct positioning of sling/immobilizer: Patient able to independently direct caregiver ROM for elbow, wrist and digits of operated UE: Independent Sling wearing schedule (on at all times/off for ADL's): Independent Proper positioning of operated UE when showering: Independent Positioning of UE while sleeping: Patient able to independently direct caregiver Overall Min A  - caregiver able to assist    Home Living Family/patient expects to be discharged to:: Private residence Living Arrangements: Spouse/significant other Available Help at Discharge: Family Type of Home: House Home Access: Level entry     Home Layout: Two level;Able to live on main level with bedroom/bathroom     Bathroom Shower/Tub: Producer, television/film/video: Standard Bathroom Accessibility: No   Home Equipment: Agricultural consultant (2 wheels)          Prior Functioning/Environment Prior Level of Function : Independent/Modified Independent;Driving;Working/employed (plays golf adn tennis)                    OT Problem List: Decreased strength;Decreased range of motion;Decreased knowledge of precautions;Impaired UE functional use   OT Treatment/Interventions:        OT Goals(Current goals can be found in the care plan section)   Acute Rehab OT Goals Patient Stated Goal: return to playing golf OT Goal Formulation: All assessment and education complete, DC therapy   OT Frequency:       Co-evaluation              AM-PAC OT "6 Clicks" Daily Activity  Outcome Measure Help from another person eating meals?: A Little Help from another person taking care of personal grooming?: A Little Help from another person toileting, which includes using toliet, bedpan, or urinal?: A Little Help from another person bathing (including washing, rinsing, drying)?: A Little Help from another person to put on and taking off regular upper body clothing?: A Little Help from another person to put on and taking off regular lower body clothing?: A Little 6 Click Score: 18   End of Session Equipment Utilized During Treatment: Gait belt Nurse Communication: Mobility status;Other (comment) (ready for DC)  Activity Tolerance: Patient tolerated treatment well Patient left: in chair;with family/visitor present  OT Visit Diagnosis: Muscle weakness (generalized) (M62.81)                 Time: 0102-7253 OT Time Calculation (min): 32 min Charges:  OT General Charges $OT Visit: 1 Visit OT Evaluation $OT Eval Low Complexity: 1 Low OT Treatments $Self Care/Home Management : 8-22 mins  Luisa Dago, OT/L   Acute OT Clinical Specialist Acute Rehabilitation Services Pager [phone] Office [phone]   [facility] 07/09/2023, 2:44 PM

## 2023-07-09 NOTE — H&P (Signed)
 Glenn [name] is an 83 y.o. male.   Chief Complaint: left shoulder pain and dysfunction HPI: Endstage L shoulder arthritis with rotator cuff disease, significant pain and dysfunction, failed conservative measures.  Pain interferes with sleep and quality of life.   Past Medical History:  Diagnosis Date   Atrial fibrillation [facility])    takes aspirin for this   Brachial plexus injury 1995   Left arm   Complication of anesthesia    L arm brachial was stretched and Left arm was paralyzed for 4 months   Dysrhythmia 2009   A-fib   HTN (hypertension)    6 years   Injury of brachial plexus    Prostate cancer (HCC)    Prostate cancer (HCC)    Sleep apnea    Cpap   Torn rotator cuff    Torn rotator cuff 2000   Right arm    Past Surgical History:  Procedure Laterality Date   ETT  2006   High fitness but bp of 255   Int fixation left forearm  01/19/1989   KNEE SURGERY     Arthroplasty Left   MOHS SURGERY     PROSTATECTOMY     1995   removal of basal cell     TONSILLECTOMY     TOTAL KNEE ARTHROPLASTY Right 08/09/2019   Procedure: TOTAL KNEE ARTHROPLASTY;  Surgeon: Durene Romans, MD;  Location: [facility];  Service: Orthopedics;  Laterality: Right;  70 mins    Family History  Problem Relation Age of Onset   Heart attack Father        MI   Obesity Brother    Parkinsonism Brother    Prostate cancer Brother    Colon cancer Neg Hx    Esophageal cancer Neg Hx    Stomach cancer Neg Hx    Rectal cancer Neg Hx    Social History:  reports that he quit smoking about 38 years ago. His smoking use included cigarettes. He started smoking about 53 years ago. He has a 7.5 pack-year smoking history. He has never used smokeless tobacco. He reports current alcohol use. He reports that he does not use drugs.  Allergies: No Known Allergies  Medications Prior to Admission  Medication Sig Dispense Refill   Acetaminophen (TYLENOL) 325 MG CAPS Take 325 mg by mouth as needed (pain).     apixaban (ELIQUIS)  5 MG TABS tablet TAKE 1 TABLET BY MOUTH TWICE A DAY 180 tablet 0   atorvastatin (LIPITOR) 10 MG tablet TAKE 1 TABLET BY MOUTH DAILY 90 tablet 3   hydrochlorothiazide (HYDRODIURIL) 25 MG tablet TAKE 1 TABLET BY MOUTH DAILY 90 tablet 3   lisinopril (ZESTRIL) 40 MG tablet TAKE 1 TABLET BY MOUTH DAILY 90 tablet 3   tetrahydrozoline 0.05 % ophthalmic solution Place 1-2 drops into both eyes daily as needed (dry/irritated eyes.).     amoxicillin (AMOXIL) 500 MG capsule Take 2,000 mg by mouth See admin instructions. Take 4 capsules (2000 mg) by mouth 1 hour prior to dental procedures.      No results found for this or any previous visit (from the past 48 hours). No results found.  Review of Systems  All other systems reviewed and are negative.   Blood pressure (!) 154/78, pulse (!) 53, temperature 97.6 F (36.4 C), temperature source Oral, resp. rate 16, height 5\' 11"  (1.803 m), weight 94.3 kg, SpO2 96%. Physical Exam Constitutional:      Appearance: He is well-developed.  HENT:  Head: Atraumatic.  Eyes:     Extraocular Movements: Extraocular movements intact.  Cardiovascular:     Pulses: Normal pulses.  Pulmonary:     Effort: Pulmonary effort is normal.  Musculoskeletal:     Comments: Left shoulder pain with limited ROM. NVID.  Skin:    General: Skin is warm and dry.  Neurological:     Mental Status: He is alert and oriented to person, place, and time.  Psychiatric:        Mood and Affect: Mood normal.      Assessment/Plan Endstage L shoulder arthritis with rotator cuff disease, significant pain and dysfunction, failed conservative measures.  Plan L reverse TSA Risks / benefits of surgery discussed Consent on chart  NPO for OR Preop antibiotics   Glenn Hamilton, MD 07/09/2023, 8:53 AM

## 2023-07-09 NOTE — Op Note (Signed)
 Procedure(s): ARTHROPLASTY, SHOULDER, TOTAL, REVERSE Procedure Note  Glenn [name] male 83 y.o. 07/09/2023  Preoperative diagnosis: Left shoulder end-stage arthritis with associated rotator cuff disease  Postoperative diagnosis: Same  Procedure(s) and Anesthesia Type:    * ARTHROPLASTY, SHOULDER, TOTAL, REVERSE - General   Indications:  83 y.o. male  With endstage left shoulder arthritis with associated rotator cuff disease.  pain and dysfunction interfered with quality of life and nonoperative treatment with activity modification, NSAIDS and injections failed.     Surgeon: [name]   Assistants: Fredia Sorrow PA-C Amber was present and scrubbed throughout the procedure and was essential in positioning, retraction, exposure, and closure)  Anesthesia: General endotracheal anesthesia with preoperative interscalene block given by the attending anesthesiologist    Procedure Detail  ARTHROPLASTY, SHOULDER, TOTAL, REVERSE   Estimated Blood Loss:  200 mL         Drains: none  Blood Given: none          Specimens: none        Complications:  * No complications entered in OR log *         Disposition: PACU - hemodynamically stable.         Condition: stable      OPERATIVE FINDINGS:  A DJO Altivate pressfit reverse total shoulder arthroplasty was placed with a  size 14 stem, a 36 glenosphere, and a standard-mm poly insert. The base plate  fixation was excellent.  PROCEDURE: The patient was identified in the preoperative holding area  where I personally marked the operative site after verifying site, side,  and procedure with the patient. The patient was taken back to the operating room where all extremities were  carefully padded in position after general anesthesia was induced. She  was placed in a beach-chair position and the operative upper extremity was  prepped and draped in a standard sterile fashion. An approximately 10-  cm incision was made from the tip  of the coracoid process to the center  point of the humerus at the level of the axilla. Dissection was carried  down through subcutaneous tissues to the level of the cephalic vein  which was taken laterally with the deltoid. The pectoralis major was  retracted medially. The subdeltoid space was developed and the lateral  edge of the conjoined tendon was identified. The undersurface of  conjoined tendon was palpated and the musculocutaneous nerve was not in  the field. Retractor was placed underneath the conjoined and second  retractor was placed lateral into the deltoid. The circumflex humeral  artery and vessels were identified and clamped and coagulated. The  biceps tendon was tenodesed to the upper border of the pectoralis major.  The subscapularis was taken down as a peel.  The  joint was then gently externally rotated while the capsule was released  from the humeral neck around to just beyond the 6 o'clock position. At  this point, the joint was dislocated and the humeral head was presented  into the wound. The excessive osteophyte formation was removed with a  large rongeur.  The cutting guide was used to make the appropriate  head cut and the head was saved for potentially bone grafting.  The glenoid was exposed with the arm in an  abducted extended position. The anterior and posterior labrum were  completely excised and the capsule was released circumferentially to  allow for exposure of the glenoid for preparation. The 2.5 mm drill was  placed using the guide in 5-10  inferior angulation and the tap was then advanced in the same hole. Small and large reamers were then used. The tap was then removed and the Metaglene was then screwed in with excellent purchase.  The peripheral guide was then used to drilled measured and filled peripheral locking screws. The size 36 glenosphere was then impacted on the [facility] taper and the central screw was placed. The humerus was then again exposed and the  diaphyseal reamers were used followed by the metaphyseal reamers. The final broach was left in place in the proximal trial was placed. The joint was reduced and with this implant it was felt that soft tissue tensioning was appropriate with excellent stability and excellent range of motion. Therefore, final humeral stem was placed press-fit.  And then the trial polyethylene inserts were tested again and the above implant was felt to be the most appropriate for final insertion. The joint was reduced taken through full range of motion and felt to be stable. Soft tissue tension was appropriate.  The joint was then copiously irrigated with pulse  lavage and the wound was then closed. The subscapularis was repaired loosely with one #2 FiberWire.  Skin was closed with 2-0 Vicryl in a deep dermal layer and 4-0  Monocryl for skin closure. Steri-Strips were applied. Sterile  dressings were then applied as well as a sling. The patient was allowed  to awaken from general anesthesia, transferred to stretcher, and taken  to recovery room in stable condition.   POSTOPERATIVE PLAN: The patient will be observed in the recovery room and if pain is well-controlled and he is hemodynamically stable he can be discharged home today with family.

## 2023-07-09 NOTE — Anesthesia Preprocedure Evaluation (Signed)
 Anesthesia Evaluation  Patient identified by MRN, date of birth, ID band Patient awake    Reviewed: Allergy & Precautions, NPO status , Patient's Chart, lab work & pertinent test results  History of Anesthesia Complications Negative for: history of anesthetic complications  Airway Mallampati: III  TM Distance: >3 FB Neck ROM: Full    Dental  (+) Partial Upper   Pulmonary former smoker   breath sounds clear to auscultation       Cardiovascular hypertension, Pt. on medications + dysrhythmias Atrial Fibrillation  Rhythm:Regular  1. Left ventricular ejection fraction, by estimation, is 60 to 65%. The  left ventricle has normal function. The left ventricle has no regional  wall motion abnormalities. There is mild left ventricular hypertrophy.  Left ventricular diastolic parameters  are indeterminate.   2. Right ventricular systolic function is normal. The right ventricular  size is mildly enlarged. There is moderately elevated pulmonary artery  systolic pressure. The estimated right ventricular systolic pressure is  50.5 mmHg.   3. Left atrial size was severely dilated.   4. Right atrial size was severely dilated.   5. The mitral valve is grossly normal. Trivial mitral valve  regurgitation. No evidence of mitral stenosis.   6. The aortic valve is abnormal. There is moderate calcification of the  aortic valve. Aortic valve regurgitation is trivial. Aortic valve  sclerosis/calcification is present, without any evidence of aortic  stenosis.   7. Aortic dilatation noted. There is mild dilatation of the ascending  aorta, measuring 42 mm.   8. The inferior vena cava is dilated in size with >50% respiratory  variability, suggesting right atrial pressure of 8 mmHg.      Neuro/Psych neg Seizures H/o left brachial plexus injury with post weakness for 4 months, stretch injury from positioning during prostate surgery ~30 yrs ago   Neuromuscular disease    GI/Hepatic negative GI ROS, Neg liver ROS,,,  Endo/Other  negative endocrine ROS    Renal/GU negative Renal ROSLab Results      Component                Value               Date                      NA                       138                 07/03/2023                K                        4.0                 07/03/2023                CO2                      22                  07/03/2023                GLUCOSE                  99  07/03/2023                BUN                      32 (H)              07/03/2023                CREATININE               1.11                07/03/2023                CALCIUM                  8.8 (L)             07/03/2023                GFR                      79.35               06/18/2011                EGFR                     71.5                05/19/2023                GFRNONAA                 >60                 07/03/2023                Musculoskeletal  (+) Arthritis ,    Abdominal   Peds  Hematology  (+) Blood dyscrasia Lab Results      Component                Value               Date                      WBC                      9.2                 07/03/2023                HGB                      14.7                07/03/2023                HCT                      46.6                07/03/2023                MCV                      96.3                07/03/2023  PLT                      215                 07/03/2023             eliquis   Anesthesia Other Findings   Reproductive/Obstetrics                              Anesthesia Physical Anesthesia Plan  ASA: 3  Anesthesia Plan: General   Post-op Pain Management: Ofirmev IV (intra-op)*, Toradol IV (intra-op)* and Ketamine IV*   Induction: Intravenous  PONV Risk Score and Plan: 3 and Ondansetron and Dexamethasone  Airway Management Planned: Oral ETT  Additional Equipment:  None  Intra-op Plan:   Post-operative Plan: Extubation in OR  Informed Consent: I have reviewed the patients History and Physical, chart, labs and discussed the procedure including the risks, benefits and alternatives for the proposed anesthesia with the patient or authorized representative who has indicated his/her understanding and acceptance.     Dental advisory given  Plan Discussed with: CRNA  Anesthesia Plan Comments:          Anesthesia Quick Evaluation

## 2023-07-09 NOTE — Transfer of Care (Signed)
 Immediate Anesthesia Transfer of Care Note  Patient: Glenn [name]  Procedure(s) Performed: ARTHROPLASTY, SHOULDER, TOTAL, REVERSE (Left: Shoulder)  Patient Location: PACU  Anesthesia Type:General  Level of Consciousness: drowsy  Airway & Oxygen Therapy: Patient Spontanous Breathing and Patient connected to face mask oxygen  Post-op Assessment: Report given to RN and Post -op Vital signs reviewed and stable  Post vital signs: Reviewed and stable  Last Vitals:  Vitals Value Taken Time  BP 162/98 07/09/23 1116  Temp    Pulse 48 07/09/23 1119  Resp 23 07/09/23 1119  SpO2 99 % 07/09/23 1119  Vitals shown include unfiled device data.  Last Pain:  Vitals:   07/09/23 0727  TempSrc: Oral         Complications: No notable events documented.

## 2023-07-09 NOTE — Anesthesia Postprocedure Evaluation (Signed)
 Anesthesia Post Note  Patient: Franky L Sokolow  Procedure(s) Performed: ARTHROPLASTY, SHOULDER, TOTAL, REVERSE (Left: Shoulder)     Patient location during evaluation: PACU Anesthesia Type: General Level of consciousness: awake and alert Pain management: pain level controlled Vital Signs Assessment: post-procedure vital signs reviewed and stable Respiratory status: spontaneous breathing, nonlabored ventilation and respiratory function stable Cardiovascular status: blood pressure returned to baseline and stable Postop Assessment: no apparent nausea or vomiting Anesthetic complications: no   No notable events documented.  Last Vitals:  Vitals:   07/09/23 1215 07/09/23 1231  BP: (!) 144/73 (!) 154/80  Pulse: (!) 59   Resp: 13   Temp:  (!) 36.4 C  SpO2: 96%     Last Pain:  Vitals:   07/09/23 1231  TempSrc: Oral  PainSc:                  Mary-Anne Polizzi

## 2023-07-09 NOTE — Anesthesia Procedure Notes (Signed)
 Procedure Name: Intubation Date/Time: 07/09/2023 9:59 AM  Performed by: Elisabeth Cara, CRNAPre-anesthesia Checklist: Patient identified, Emergency Drugs available, Suction available and Timeout performed Patient Re-evaluated:Patient Re-evaluated prior to induction Oxygen Delivery Method: Circle system utilized Preoxygenation: Pre-oxygenation with 100% oxygen Induction Type: IV induction Ventilation: Two handed mask ventilation required Laryngoscope Size: Miller and 2 Grade View: Grade II Tube type: Oral Tube size: 7.5 mm Number of attempts: 1 Airway Equipment and Method: Patient positioned with wedge pillow and Stylet Placement Confirmation: ETT inserted through vocal cords under direct vision, breath sounds checked- equal and bilateral and positive ETCO2 Secured at: 23 cm Tube secured with: Tape Dental Injury: Teeth and Oropharynx as per pre-operative assessment

## 2023-07-10 ENCOUNTER — Encounter (HOSPITAL_COMMUNITY): Payer: Self-pay | Admitting: Orthopedic Surgery

## 2023-07-14 DIAGNOSIS — E785 Hyperlipidemia, unspecified: Secondary | ICD-10-CM | POA: Diagnosis not present

## 2023-07-15 DIAGNOSIS — N1831 Chronic kidney disease, stage 3a: Secondary | ICD-10-CM | POA: Diagnosis not present

## 2023-07-15 DIAGNOSIS — I10 Essential (primary) hypertension: Secondary | ICD-10-CM | POA: Diagnosis not present

## 2023-07-15 DIAGNOSIS — R7301 Impaired fasting glucose: Secondary | ICD-10-CM | POA: Diagnosis not present

## 2023-07-15 DIAGNOSIS — I4821 Permanent atrial fibrillation: Secondary | ICD-10-CM | POA: Diagnosis not present

## 2023-07-15 DIAGNOSIS — E785 Hyperlipidemia, unspecified: Secondary | ICD-10-CM | POA: Diagnosis not present

## 2023-07-21 DIAGNOSIS — R7301 Impaired fasting glucose: Secondary | ICD-10-CM | POA: Diagnosis not present

## 2023-07-21 DIAGNOSIS — Z125 Encounter for screening for malignant neoplasm of prostate: Secondary | ICD-10-CM | POA: Diagnosis not present

## 2023-07-21 DIAGNOSIS — Z1339 Encounter for screening examination for other mental health and behavioral disorders: Secondary | ICD-10-CM | POA: Diagnosis not present

## 2023-07-21 DIAGNOSIS — Z0000 Encounter for general adult medical examination without abnormal findings: Secondary | ICD-10-CM | POA: Diagnosis not present

## 2023-07-21 DIAGNOSIS — N39490 Overflow incontinence: Secondary | ICD-10-CM | POA: Diagnosis not present

## 2023-07-21 DIAGNOSIS — I10 Essential (primary) hypertension: Secondary | ICD-10-CM | POA: Diagnosis not present

## 2023-07-21 DIAGNOSIS — E785 Hyperlipidemia, unspecified: Secondary | ICD-10-CM | POA: Diagnosis not present

## 2023-07-21 DIAGNOSIS — C4359 Malignant melanoma of other part of trunk: Secondary | ICD-10-CM | POA: Diagnosis not present

## 2023-07-21 DIAGNOSIS — I272 Pulmonary hypertension, unspecified: Secondary | ICD-10-CM | POA: Diagnosis not present

## 2023-07-21 DIAGNOSIS — Z1331 Encounter for screening for depression: Secondary | ICD-10-CM | POA: Diagnosis not present

## 2023-07-21 DIAGNOSIS — I700 Atherosclerosis of aorta: Secondary | ICD-10-CM | POA: Diagnosis not present

## 2023-07-21 DIAGNOSIS — R600 Localized edema: Secondary | ICD-10-CM | POA: Diagnosis not present

## 2023-07-21 DIAGNOSIS — D6869 Other thrombophilia: Secondary | ICD-10-CM | POA: Diagnosis not present

## 2023-07-21 DIAGNOSIS — R82998 Other abnormal findings in urine: Secondary | ICD-10-CM | POA: Diagnosis not present

## 2023-07-21 DIAGNOSIS — N390 Urinary tract infection, site not specified: Secondary | ICD-10-CM | POA: Diagnosis not present

## 2023-07-21 DIAGNOSIS — G4733 Obstructive sleep apnea (adult) (pediatric): Secondary | ICD-10-CM | POA: Diagnosis not present

## 2023-07-21 DIAGNOSIS — I129 Hypertensive chronic kidney disease with stage 1 through stage 4 chronic kidney disease, or unspecified chronic kidney disease: Secondary | ICD-10-CM | POA: Diagnosis not present

## 2023-07-21 DIAGNOSIS — I4821 Permanent atrial fibrillation: Secondary | ICD-10-CM | POA: Diagnosis not present

## 2023-07-21 DIAGNOSIS — N1831 Chronic kidney disease, stage 3a: Secondary | ICD-10-CM | POA: Diagnosis not present

## 2023-07-24 DIAGNOSIS — M25612 Stiffness of left shoulder, not elsewhere classified: Secondary | ICD-10-CM | POA: Diagnosis not present

## 2023-07-24 DIAGNOSIS — M19012 Primary osteoarthritis, left shoulder: Secondary | ICD-10-CM | POA: Diagnosis not present

## 2023-07-24 DIAGNOSIS — Z96612 Presence of left artificial shoulder joint: Secondary | ICD-10-CM | POA: Diagnosis not present

## 2023-07-28 DIAGNOSIS — Z96612 Presence of left artificial shoulder joint: Secondary | ICD-10-CM | POA: Diagnosis not present

## 2023-07-28 DIAGNOSIS — N3946 Mixed incontinence: Secondary | ICD-10-CM | POA: Insufficient documentation

## 2023-07-28 DIAGNOSIS — M25612 Stiffness of left shoulder, not elsewhere classified: Secondary | ICD-10-CM | POA: Diagnosis not present

## 2023-07-30 DIAGNOSIS — Z96612 Presence of left artificial shoulder joint: Secondary | ICD-10-CM | POA: Diagnosis not present

## 2023-07-30 DIAGNOSIS — M25612 Stiffness of left shoulder, not elsewhere classified: Secondary | ICD-10-CM | POA: Diagnosis not present

## 2023-07-30 DIAGNOSIS — N3946 Mixed incontinence: Secondary | ICD-10-CM | POA: Diagnosis not present

## 2023-07-31 ENCOUNTER — Ambulatory Visit: Payer: PPO | Admitting: Podiatry

## 2023-07-31 DIAGNOSIS — G629 Polyneuropathy, unspecified: Secondary | ICD-10-CM | POA: Insufficient documentation

## 2023-07-31 DIAGNOSIS — E785 Hyperlipidemia, unspecified: Secondary | ICD-10-CM | POA: Insufficient documentation

## 2023-07-31 DIAGNOSIS — Z8546 Personal history of malignant neoplasm of prostate: Secondary | ICD-10-CM | POA: Insufficient documentation

## 2023-07-31 DIAGNOSIS — N39490 Overflow incontinence: Secondary | ICD-10-CM | POA: Insufficient documentation

## 2023-07-31 DIAGNOSIS — I272 Pulmonary hypertension, unspecified: Secondary | ICD-10-CM | POA: Insufficient documentation

## 2023-07-31 DIAGNOSIS — N1831 Chronic kidney disease, stage 3a: Secondary | ICD-10-CM | POA: Insufficient documentation

## 2023-07-31 DIAGNOSIS — D6869 Other thrombophilia: Secondary | ICD-10-CM | POA: Insufficient documentation

## 2023-07-31 DIAGNOSIS — Z860101 Personal history of adenomatous and serrated colon polyps: Secondary | ICD-10-CM | POA: Insufficient documentation

## 2023-07-31 DIAGNOSIS — R400 Somnolence: Secondary | ICD-10-CM | POA: Insufficient documentation

## 2023-07-31 DIAGNOSIS — I700 Atherosclerosis of aorta: Secondary | ICD-10-CM | POA: Insufficient documentation

## 2023-07-31 DIAGNOSIS — R7301 Impaired fasting glucose: Secondary | ICD-10-CM | POA: Insufficient documentation

## 2023-07-31 DIAGNOSIS — C4359 Malignant melanoma of other part of trunk: Secondary | ICD-10-CM | POA: Insufficient documentation

## 2023-07-31 DIAGNOSIS — L97522 Non-pressure chronic ulcer of other part of left foot with fat layer exposed: Secondary | ICD-10-CM | POA: Diagnosis not present

## 2023-07-31 DIAGNOSIS — I77810 Thoracic aortic ectasia: Secondary | ICD-10-CM | POA: Insufficient documentation

## 2023-07-31 NOTE — Progress Notes (Signed)
 Subjective:  Patient ID: Glenn [name], male    DOB: [birth year],  MRN: [id number]  Chief Complaint  Patient presents with   Nail Problem    Nail trim     83 y.o. male presents with the above complaint. Patient presents with left second digit ulceration.  Patient states been present for quite some time.  He is try taking care of himself he has not been able to eat he states that it rubs against the top of his shoe he has predislocation syndrome pain scale 7 out of 10 dull aching nature would like to discuss treatment options for   Review of Systems: Negative except as noted in the HPI. Denies N/V/F/Ch.  Past Medical History:  Diagnosis Date   Atrial fibrillation [facility])    takes aspirin for this   Brachial plexus injury 1995   Left arm   Complication of anesthesia    L arm brachial was stretched and Left arm was paralyzed for 4 months   Dysrhythmia 2009   A-fib   HTN (hypertension)    6 years   Injury of brachial plexus    Prostate cancer (HCC)    Prostate cancer (HCC)    Sleep apnea    Cpap   Torn rotator cuff    Torn rotator cuff 2000   Right arm    Current Outpatient Medications:    Acetaminophen (TYLENOL) 325 MG CAPS, Take 325 mg by mouth as needed (pain)., Disp: , Rfl:    amoxicillin (AMOXIL) 500 MG capsule, Take 2,000 mg by mouth See admin instructions. Take 4 capsules (2000 mg) by mouth 1 hour prior to dental procedures., Disp: , Rfl:    apixaban (ELIQUIS) 5 MG TABS tablet, TAKE 1 TABLET BY MOUTH TWICE A DAY, Disp: 180 tablet, Rfl: 0   atorvastatin (LIPITOR) 10 MG tablet, TAKE 1 TABLET BY MOUTH DAILY, Disp: 90 tablet, Rfl: 3   hydrochlorothiazide (HYDRODIURIL) 25 MG tablet, TAKE 1 TABLET BY MOUTH DAILY, Disp: 90 tablet, Rfl: 3   lisinopril (ZESTRIL) 40 MG tablet, TAKE 1 TABLET BY MOUTH DAILY, Disp: 90 tablet, Rfl: 3   oxyCODONE (ROXICODONE) 5 MG immediate release tablet, Take 1 tablet (5 mg total) by mouth every 4 (four) hours as needed., Disp: 30 tablet, Rfl: 0    tetrahydrozoline 0.05 % ophthalmic solution, Place 1-2 drops into both eyes daily as needed (dry/irritated eyes.)., Disp: , Rfl:    tiZANidine (ZANAFLEX) 4 MG tablet, Take 1 tablet (4 mg total) by mouth every 8 (eight) hours as needed for muscle spasms., Disp: 30 tablet, Rfl: 0   Vibegron (GEMTESA) 75 MG TABS, Take by mouth., Disp: , Rfl:   Social History   Tobacco Use  Smoking Status Former   Current packs/day: 0.00   Average packs/day: 0.5 packs/day for 15.0 years (7.5 ttl pk-yrs)   Types: Cigarettes   Start date: 04/21/1970   Quit date: 04/21/1985   Years since quitting: 38.3  Smokeless Tobacco Never    No Known Allergies Objective:  There were no vitals filed for this visit. There is no height or weight on file to calculate BMI. Constitutional Well developed. Well nourished.  Vascular Dorsalis pedis pulses palpable bilaterally. Posterior tibial pulses palpable bilaterally. Capillary refill normal to all digits.  No cyanosis or clubbing noted. Pedal hair growth normal.  Neurologic Normal speech. Oriented to person, place, and time. Epicritic sensation to light touch grossly present bilaterally.  Dermatologic Left second digit ulceration fat layer exposed.  Pain on palpation.  No purulent drainage noted.  No erythema noted.  Does not probe down to bone.  Not purulent drainage noted  Orthopedic: Normal joint ROM without pain or crepitus bilaterally. No visible deformities. No bony tenderness.   Radiographs: None Assessment:   1. Toe ulcer, left, with fat layer exposed (HCC)    Plan:  Patient was evaluated and treated and all questions answered.  Left second digit ulceration fat layer exposed - All questions and concerns were discussed with the patient in extensive detail given the amount of ulceration is present patient will benefit from triple antibiotic and a Band-Aid I discussed shoe gear modification with wider toebox shoes.  I encouraged him to take the pressure off of  the toe he states understand will do so immediately  No follow-ups on file.

## 2023-08-03 DIAGNOSIS — M25612 Stiffness of left shoulder, not elsewhere classified: Secondary | ICD-10-CM | POA: Diagnosis not present

## 2023-08-03 DIAGNOSIS — Z96612 Presence of left artificial shoulder joint: Secondary | ICD-10-CM | POA: Diagnosis not present

## 2023-08-05 DIAGNOSIS — M25612 Stiffness of left shoulder, not elsewhere classified: Secondary | ICD-10-CM | POA: Diagnosis not present

## 2023-08-05 DIAGNOSIS — Z96612 Presence of left artificial shoulder joint: Secondary | ICD-10-CM | POA: Diagnosis not present

## 2023-08-11 DIAGNOSIS — M25612 Stiffness of left shoulder, not elsewhere classified: Secondary | ICD-10-CM | POA: Diagnosis not present

## 2023-08-11 DIAGNOSIS — Z96612 Presence of left artificial shoulder joint: Secondary | ICD-10-CM | POA: Diagnosis not present

## 2023-08-13 DIAGNOSIS — M25612 Stiffness of left shoulder, not elsewhere classified: Secondary | ICD-10-CM | POA: Diagnosis not present

## 2023-08-13 DIAGNOSIS — Z96612 Presence of left artificial shoulder joint: Secondary | ICD-10-CM | POA: Diagnosis not present

## 2023-08-17 DIAGNOSIS — M25612 Stiffness of left shoulder, not elsewhere classified: Secondary | ICD-10-CM | POA: Diagnosis not present

## 2023-08-17 DIAGNOSIS — Z96612 Presence of left artificial shoulder joint: Secondary | ICD-10-CM | POA: Diagnosis not present

## 2023-08-18 DIAGNOSIS — M25612 Stiffness of left shoulder, not elsewhere classified: Secondary | ICD-10-CM | POA: Diagnosis not present

## 2023-08-18 DIAGNOSIS — Z96612 Presence of left artificial shoulder joint: Secondary | ICD-10-CM | POA: Diagnosis not present

## 2023-08-19 DIAGNOSIS — L738 Other specified follicular disorders: Secondary | ICD-10-CM | POA: Diagnosis not present

## 2023-08-19 DIAGNOSIS — I872 Venous insufficiency (chronic) (peripheral): Secondary | ICD-10-CM | POA: Diagnosis not present

## 2023-08-19 DIAGNOSIS — I8311 Varicose veins of right lower extremity with inflammation: Secondary | ICD-10-CM | POA: Diagnosis not present

## 2023-08-19 DIAGNOSIS — L821 Other seborrheic keratosis: Secondary | ICD-10-CM | POA: Diagnosis not present

## 2023-08-19 DIAGNOSIS — D1801 Hemangioma of skin and subcutaneous tissue: Secondary | ICD-10-CM | POA: Diagnosis not present

## 2023-08-19 DIAGNOSIS — I8312 Varicose veins of left lower extremity with inflammation: Secondary | ICD-10-CM | POA: Diagnosis not present

## 2023-08-19 DIAGNOSIS — Z85820 Personal history of malignant melanoma of skin: Secondary | ICD-10-CM | POA: Diagnosis not present

## 2023-08-19 DIAGNOSIS — D692 Other nonthrombocytopenic purpura: Secondary | ICD-10-CM | POA: Diagnosis not present

## 2023-08-19 DIAGNOSIS — Z85828 Personal history of other malignant neoplasm of skin: Secondary | ICD-10-CM | POA: Diagnosis not present

## 2023-08-19 DIAGNOSIS — B356 Tinea cruris: Secondary | ICD-10-CM | POA: Diagnosis not present

## 2023-08-20 ENCOUNTER — Other Ambulatory Visit: Payer: Self-pay

## 2023-08-20 DIAGNOSIS — I4821 Permanent atrial fibrillation: Secondary | ICD-10-CM

## 2023-08-20 MED ORDER — APIXABAN 5 MG PO TABS: 5.0000 mg | ORAL_TABLET | Freq: Two times a day (BID) | ORAL | 1 refills | Status: DC

## 2023-08-20 NOTE — Telephone Encounter (Signed)
 Prescription refill request for Eliquis  received. Indication: Afib  Last office visit: 12/15/22 Carolynne Citron)  Scr: 1.1 (07/03/23)  Age: 83 Weight: 94.3kg  Appropriate dose. Refill sent.

## 2023-08-21 ENCOUNTER — Ambulatory Visit: Admitting: Podiatry

## 2023-08-21 ENCOUNTER — Encounter: Payer: Self-pay | Admitting: Podiatry

## 2023-08-21 DIAGNOSIS — L97522 Non-pressure chronic ulcer of other part of left foot with fat layer exposed: Secondary | ICD-10-CM

## 2023-08-21 MED ORDER — SILVER SULFADIAZINE 1 % EX CREA
1.0000 | TOPICAL_CREAM | Freq: Every day | CUTANEOUS | 0 refills | Status: AC
Start: 2023-08-21 — End: ?

## 2023-08-21 NOTE — Progress Notes (Signed)
 Subjective:  Patient ID: Glenn [name], male    DOB: December 15, 1940,  MRN: [id number]  Chief Complaint  Patient presents with   Foot Ulcer    Follow up ulcer 2nd toe left   "Its not that great still, but its looking better"    83 y.o. male presents with the above complaint. Patient presents with left second digit ulceration.  He states that is doing okay denies any other acute complaints.  Not great but looking better denies any other acute issues   Review of Systems: Negative except as noted in the HPI. Denies N/V/F/Ch.  Past Medical History:  Diagnosis Date   Atrial fibrillation (HCC)    takes aspirin  for this   Brachial plexus injury 1995   Left arm   Complication of anesthesia    L arm brachial was stretched and Left arm was paralyzed for 4 months   Dysrhythmia 2009   A-fib   HTN (hypertension)    6 years   Injury of brachial plexus    Prostate cancer (HCC)    Prostate cancer (HCC)    Sleep apnea    Cpap   Torn rotator cuff    Torn rotator cuff 2000   Right arm    Current Outpatient Medications:    Acetaminophen  (TYLENOL ) 325 MG CAPS, Take 325 mg by mouth as needed (pain)., Disp: , Rfl:    amoxicillin (AMOXIL) 500 MG capsule, Take 2,000 mg by mouth See admin instructions. Take 4 capsules (2000 mg) by mouth 1 hour prior to dental procedures., Disp: , Rfl:    apixaban  (ELIQUIS ) 5 MG TABS tablet, Take 1 tablet (5 mg total) by mouth 2 (two) times daily., Disp: 180 tablet, Rfl: 1   atorvastatin  (LIPITOR) 10 MG tablet, TAKE 1 TABLET BY MOUTH DAILY, Disp: 90 tablet, Rfl: 3   hydrochlorothiazide  (HYDRODIURIL ) 25 MG tablet, TAKE 1 TABLET BY MOUTH DAILY, Disp: 90 tablet, Rfl: 3   lisinopril  (ZESTRIL ) 40 MG tablet, TAKE 1 TABLET BY MOUTH DAILY, Disp: 90 tablet, Rfl: 3   oxyCODONE  (ROXICODONE ) 5 MG immediate release tablet, Take 1 tablet (5 mg total) by mouth every 4 (four) hours as needed., Disp: 30 tablet, Rfl: 0   tetrahydrozoline 0.05 % ophthalmic solution, Place 1-2 drops into both  eyes daily as needed (dry/irritated eyes.)., Disp: , Rfl:    tiZANidine  (ZANAFLEX ) 4 MG tablet, Take 1 tablet (4 mg total) by mouth every 8 (eight) hours as needed for muscle spasms., Disp: 30 tablet, Rfl: 0   Vibegron (GEMTESA) 75 MG TABS, Take by mouth., Disp: , Rfl:   Social History   Tobacco Use  Smoking Status Former   Current packs/day: 0.00   Average packs/day: 0.5 packs/day for 15.0 years (7.5 ttl pk-yrs)   Types: Cigarettes   Start date: 04/21/1970   Quit date: 04/21/1985   Years since quitting: 38.3  Smokeless Tobacco Never    No Known Allergies Objective:  There were no vitals filed for this visit. There is no height or weight on file to calculate BMI. Constitutional Well developed. Well nourished.  Vascular Dorsalis pedis pulses palpable bilaterally. Posterior tibial pulses palpable bilaterally. Capillary refill normal to all digits.  No cyanosis or clubbing noted. Pedal hair growth normal.  Neurologic Normal speech. Oriented to person, place, and time. Epicritic sensation to light touch grossly present bilaterally.  Dermatologic Left second digit ulceration fat layer exposed.  Pain on palpation.  No purulent drainage noted.  No erythema noted.  Does not probe down to  bone.  Not purulent drainage noted  Orthopedic: Normal joint ROM without pain or crepitus bilaterally. No visible deformities. No bony tenderness.   Radiographs: None Assessment:   No diagnosis found.  Plan:  Patient was evaluated and treated and all questions answered.  Left second digit ulceration fat layer exposed - All questions and concerns were discussed with the patient in extensive detail given the amount of ulceration is present patient will benefit from Silvadene cream and a Band-Aid I discussed shoe gear modification with wider toebox shoes.  I encouraged him to take the pressure off of the toe he states understand will do so immediately  No follow-ups on file.

## 2023-08-24 DIAGNOSIS — M25612 Stiffness of left shoulder, not elsewhere classified: Secondary | ICD-10-CM | POA: Diagnosis not present

## 2023-08-24 DIAGNOSIS — Z96612 Presence of left artificial shoulder joint: Secondary | ICD-10-CM | POA: Diagnosis not present

## 2023-08-26 DIAGNOSIS — Z96612 Presence of left artificial shoulder joint: Secondary | ICD-10-CM | POA: Diagnosis not present

## 2023-08-26 DIAGNOSIS — M25612 Stiffness of left shoulder, not elsewhere classified: Secondary | ICD-10-CM | POA: Diagnosis not present

## 2023-08-31 DIAGNOSIS — Z96612 Presence of left artificial shoulder joint: Secondary | ICD-10-CM | POA: Diagnosis not present

## 2023-08-31 DIAGNOSIS — N3946 Mixed incontinence: Secondary | ICD-10-CM | POA: Diagnosis not present

## 2023-08-31 DIAGNOSIS — M25612 Stiffness of left shoulder, not elsewhere classified: Secondary | ICD-10-CM | POA: Diagnosis not present

## 2023-09-02 DIAGNOSIS — M25612 Stiffness of left shoulder, not elsewhere classified: Secondary | ICD-10-CM | POA: Diagnosis not present

## 2023-09-02 DIAGNOSIS — Z96612 Presence of left artificial shoulder joint: Secondary | ICD-10-CM | POA: Diagnosis not present

## 2023-09-09 DIAGNOSIS — Z96612 Presence of left artificial shoulder joint: Secondary | ICD-10-CM | POA: Diagnosis not present

## 2023-09-09 DIAGNOSIS — M25612 Stiffness of left shoulder, not elsewhere classified: Secondary | ICD-10-CM | POA: Diagnosis not present

## 2023-09-10 DIAGNOSIS — N3946 Mixed incontinence: Secondary | ICD-10-CM | POA: Diagnosis not present

## 2023-09-11 DIAGNOSIS — M25612 Stiffness of left shoulder, not elsewhere classified: Secondary | ICD-10-CM | POA: Diagnosis not present

## 2023-09-11 DIAGNOSIS — Z96612 Presence of left artificial shoulder joint: Secondary | ICD-10-CM | POA: Diagnosis not present

## 2023-09-15 ENCOUNTER — Telehealth: Payer: Self-pay | Admitting: Neurology

## 2023-09-15 DIAGNOSIS — M25612 Stiffness of left shoulder, not elsewhere classified: Secondary | ICD-10-CM | POA: Diagnosis not present

## 2023-09-15 DIAGNOSIS — Z96612 Presence of left artificial shoulder joint: Secondary | ICD-10-CM | POA: Diagnosis not present

## 2023-09-15 NOTE — Telephone Encounter (Signed)
 rs appointment

## 2023-09-16 DIAGNOSIS — M6289 Other specified disorders of muscle: Secondary | ICD-10-CM | POA: Diagnosis not present

## 2023-09-16 DIAGNOSIS — M25652 Stiffness of left hip, not elsewhere classified: Secondary | ICD-10-CM | POA: Diagnosis not present

## 2023-09-16 DIAGNOSIS — N3942 Incontinence without sensory awareness: Secondary | ICD-10-CM | POA: Diagnosis not present

## 2023-09-16 DIAGNOSIS — M25651 Stiffness of right hip, not elsewhere classified: Secondary | ICD-10-CM | POA: Diagnosis not present

## 2023-09-16 DIAGNOSIS — N3946 Mixed incontinence: Secondary | ICD-10-CM | POA: Diagnosis not present

## 2023-09-17 DIAGNOSIS — M25612 Stiffness of left shoulder, not elsewhere classified: Secondary | ICD-10-CM | POA: Diagnosis not present

## 2023-09-17 DIAGNOSIS — Z96612 Presence of left artificial shoulder joint: Secondary | ICD-10-CM | POA: Diagnosis not present

## 2023-09-22 ENCOUNTER — Ambulatory Visit: Admitting: Neurology

## 2023-09-23 DIAGNOSIS — M25612 Stiffness of left shoulder, not elsewhere classified: Secondary | ICD-10-CM | POA: Diagnosis not present

## 2023-09-23 DIAGNOSIS — Z96612 Presence of left artificial shoulder joint: Secondary | ICD-10-CM | POA: Diagnosis not present

## 2023-09-24 DIAGNOSIS — M25612 Stiffness of left shoulder, not elsewhere classified: Secondary | ICD-10-CM | POA: Diagnosis not present

## 2023-09-24 DIAGNOSIS — Z96612 Presence of left artificial shoulder joint: Secondary | ICD-10-CM | POA: Diagnosis not present

## 2023-09-28 DIAGNOSIS — M25652 Stiffness of left hip, not elsewhere classified: Secondary | ICD-10-CM | POA: Diagnosis not present

## 2023-09-28 DIAGNOSIS — N3942 Incontinence without sensory awareness: Secondary | ICD-10-CM | POA: Diagnosis not present

## 2023-09-28 DIAGNOSIS — M6289 Other specified disorders of muscle: Secondary | ICD-10-CM | POA: Diagnosis not present

## 2023-09-28 DIAGNOSIS — M25651 Stiffness of right hip, not elsewhere classified: Secondary | ICD-10-CM | POA: Diagnosis not present

## 2023-09-28 DIAGNOSIS — N3946 Mixed incontinence: Secondary | ICD-10-CM | POA: Diagnosis not present

## 2023-09-28 DIAGNOSIS — N39492 Postural (urinary) incontinence: Secondary | ICD-10-CM | POA: Diagnosis not present

## 2023-09-30 DIAGNOSIS — Z96612 Presence of left artificial shoulder joint: Secondary | ICD-10-CM | POA: Diagnosis not present

## 2023-09-30 DIAGNOSIS — M25612 Stiffness of left shoulder, not elsewhere classified: Secondary | ICD-10-CM | POA: Diagnosis not present

## 2023-10-01 ENCOUNTER — Ambulatory Visit (INDEPENDENT_AMBULATORY_CARE_PROVIDER_SITE_OTHER): Admitting: Neurology

## 2023-10-01 ENCOUNTER — Encounter: Payer: Self-pay | Admitting: Neurology

## 2023-10-01 VITALS — BP 128/81 | HR 59 | Ht 70.0 in | Wt 204.0 lb

## 2023-10-01 DIAGNOSIS — I4811 Longstanding persistent atrial fibrillation: Secondary | ICD-10-CM | POA: Diagnosis not present

## 2023-10-01 DIAGNOSIS — G8929 Other chronic pain: Secondary | ICD-10-CM | POA: Diagnosis not present

## 2023-10-01 DIAGNOSIS — G4739 Other sleep apnea: Secondary | ICD-10-CM

## 2023-10-01 DIAGNOSIS — G629 Polyneuropathy, unspecified: Secondary | ICD-10-CM | POA: Diagnosis not present

## 2023-10-01 DIAGNOSIS — Z7189 Other specified counseling: Secondary | ICD-10-CM

## 2023-10-01 MED ORDER — GABAPENTIN 100 MG PO CAPS: 100.0000 mg | ORAL_CAPSULE | Freq: Every day | ORAL | 5 refills | Status: AC

## 2023-10-01 NOTE — Progress Notes (Signed)
 Misa Fedorko D, CMA  Zott, Evette Hoes, Tammy; Pearson Bounds New orders have been placed for the above pt, DOB:2040-06-09 Thanks

## 2023-10-01 NOTE — Progress Notes (Signed)
 Provider:  Neomia Banner, MD  Primary Care Physician:  Jeannine Milroy., MD [address] [place] [address]     Referring Provider: Jeannine Milroy., Md [address] [place],  [place] [address]          Chief Complaint according to patient   Patient presents with:                HISTORY OF PRESENT ILLNESS:  Glenn [name] is a 83 y.o. male patient who is here for BiPAP  revisit 10/01/2023 for Compliance on BiPAP.   Mr Gandolfi  underwent a HST in 2024 that confirmed the presence of sleep apnea, and then returned for in laboratory titration- BiPAP , the home sleep test was dated on 4-4 2024 and the patient at the time had reported an Epworth sleepiness score of 7 points fatigue severity at 24 points BMI was 31.6 neck circumference was 18-1/4 inches.  He slept 6 hours 17 minutes with 23% REM's AHI was in the 30s so it would be considered severe sleep apnea 35.1/h.  What was interesting is that there was not a great difference between REM sleep and non-REM REM sleep apneas but that he had 3.2 minutes of desaturations only and that these were associated with supine REM sleep.  Pulse rate mean was slow 51 bpm on average and the lowest heart rate during the night was 30 bpm.  I suspected that the patient was in persistent atrial fibrillation throughout the night as his heart rate variability was so great.  He was then invited for an in lab sleep study with the goal of titrating him for a therapeutic level.  He had clearly central complex apnea and atrial fibrillation could be proven during the in-lab sleep study but this was a diastolic heart failure with atrial fibrillation and his BiPAP titration started at 10 over 5 cm water  and increased step-by-step to a final 17 over 13 cm water .  He did actually sleep the best and longest time on 15 over 11 cm of water  at so the second highest pressure tried that night but his AHI was the lowest at the final pressure of 17/13.  The AHI was  8.3 scored by CMS criteria.  He was [facility] the [facility]  When he came to pick it up he learned that the machine was not available and he waited another month about to receive it.  In the meantime he had 2 upper respiratory infections that kept him from using positive airway pressure and then had shoulder surgery which affected his dexterity, not being able to place headgear and mask.  His average use - time was 3 hours 55 minutes ( 5 minutes short of compliance standard)  and apparently this was enough to charge him for his BiPAP and to ask him to return it.   He had conversations with his health team advantage insurance carrier explaining the situation, they have been notes in his medical chart explaining why he had medical reasons that limited his compliance and yet he was penalized ? Aaron Aas  Today's Epworth sleepiness score is at 7 points and fatigue severity at 25 points geriatric depression score was endorsed at 0 points but the patient is currently not treated for his sleep apnea.   There is been no cardioversion or ablation, he is asymptomatic , he is in atrial fib- chronic.  Chief concern according to patient :   I would really like to get back on Bipap, it worked for me   Has had TKR bilaterally, shoulder surgery in March 2025,other shoulder surgery pending in August.  Affecting his sleep position.        Review of Systems: Out of a complete 14 system review, the patient complains of only the following symptoms, and all other reviewed systems are negative.:   How likely are you to doze in the following situations: 0 = not likely, 1 = slight chance, 2 = moderate chance, 3 = high chance  Sitting and Reading? Watching Television? Sitting inactive in a public place (theater or meeting)? Lying down in the afternoon when circumstances permit? Sitting and talking to someone? Sitting quietly after lunch without alcohol ? In a car,  while stopped for a few minutes in traffic? As a passenger in a car for an hour without a break?  Total = 7  Urinary incontinence and urgency.      Social History   Socioeconomic History   Marital status: Married    Spouse name: Not on file   Number of children: 3   Years of education: Not on file   Highest education level: Not on file  Occupational History   Occupation: Engineer, drilling: Retired  Tobacco Use   Smoking status: Former    Current packs/day: 0.00    Average packs/day: 0.5 packs/day for 15.0 years (7.5 ttl pk-yrs)    Types: Cigarettes    Start date: 04/21/1970    Quit date: 04/21/1985    Years since quitting: 38.4   Smokeless tobacco: Never  Vaping Use   Vaping status: Never Used  Substance and Sexual Activity   Alcohol  use: Yes    Comment: Social   Drug use: No   Sexual activity: Not Currently  Other Topics Concern   Not on file  Social History Narrative   works as Veterinary surgeon;    active Media planner played BB at [employer] graduated 1964 after 2 final fours   married to [place]; 2 male and 1 male children;    multiple grandkids;    non smoker - quit 25 years ago   social ETOH   Social Drivers of Corporate investment banker Strain: Not on file  Food Insecurity: Low Risk  (07/30/2023)   Received from [facility]   Hunger Vital Sign    Worried About Running Out of Food in the Last Year: Never true    Ran Out of Food in the Last Year: Never true  Transportation Needs: No Transportation Needs (07/30/2023)   Received from [facility]    In the past 12 months, has lack of reliable transportation kept you from medical appointments, meetings, work or from getting things needed for daily living? : No  Physical Activity: Not on file  Stress: Not on file  Social Connections: Not on file    Family History  Problem Relation Age of Onset   Heart attack Father        MI   Obesity Brother    Parkinsonism Brother    Prostate cancer Brother     Colon cancer Neg Hx    Esophageal cancer Neg Hx    Stomach cancer Neg Hx    Rectal cancer Neg Hx     Past Medical History:  Diagnosis Date   Atrial fibrillation (HCC)    takes aspirin  for this   Brachial plexus injury 1995  Left arm   Complication of anesthesia    L arm brachial was stretched and Left arm was paralyzed for 4 months   Dysrhythmia 2009   A-fib   HTN (hypertension)    6 years   Injury of brachial plexus    Prostate cancer (HCC)    Prostate cancer (HCC)    Sleep apnea    Cpap   Torn rotator cuff    Torn rotator cuff 2000   Right arm    Past Surgical History:  Procedure Laterality Date   ETT  2006   High fitness but bp of 255   Int fixation left forearm  01/19/1989   KNEE SURGERY     Arthroplasty Left   MOHS SURGERY     PROSTATECTOMY     1995   removal of basal cell     REVERSE SHOULDER ARTHROPLASTY Left 07/09/2023   Procedure: ARTHROPLASTY, SHOULDER, TOTAL, REVERSE;  Surgeon: Sammye Cristal, MD;  Location: [facility];  Service: Orthopedics;  Laterality: Left;   TONSILLECTOMY     TOTAL KNEE ARTHROPLASTY Right 08/09/2019   Procedure: TOTAL KNEE ARTHROPLASTY;  Surgeon: Claiborne Crew, MD;  Location: [facility];  Service: Orthopedics;  Laterality: Right;  70 mins     Current Outpatient Medications on File Prior to Visit  Medication Sig Dispense Refill   Acetaminophen  (TYLENOL ) 325 MG CAPS Take 325 mg by mouth as needed (pain).     amoxicillin (AMOXIL) 500 MG capsule Take 2,000 mg by mouth See admin instructions. Take 4 capsules (2000 mg) by mouth 1 hour prior to dental procedures.     apixaban  (ELIQUIS ) 5 MG TABS tablet Take 1 tablet (5 mg total) by mouth 2 (two) times daily. 180 tablet 1   atorvastatin  (LIPITOR) 10 MG tablet TAKE 1 TABLET BY MOUTH DAILY 90 tablet 3   hydrochlorothiazide  (HYDRODIURIL ) 25 MG tablet TAKE 1 TABLET BY MOUTH DAILY 90 tablet 3   lisinopril  (ZESTRIL ) 40 MG tablet TAKE 1 TABLET BY MOUTH DAILY 90 tablet 3   silver  sulfADIAZINE   (SILVADENE ) 1 % cream Apply 1 Application topically daily. 50 g 0   tetrahydrozoline 0.05 % ophthalmic solution Place 1-2 drops into both eyes daily as needed (dry/irritated eyes.).     oxyCODONE  (ROXICODONE ) 5 MG immediate release tablet Take 1 tablet (5 mg total) by mouth every 4 (four) hours as needed. (Patient not taking: Reported on 10/01/2023) 30 tablet 0   tiZANidine  (ZANAFLEX ) 4 MG tablet Take 1 tablet (4 mg total) by mouth every 8 (eight) hours as needed for muscle spasms. (Patient not taking: Reported on 10/01/2023) 30 tablet 0   Vibegron (GEMTESA) 75 MG TABS Take by mouth. (Patient not taking: Reported on 10/01/2023)     No current facility-administered medications on file prior to visit.    No Known Allergies   DIAGNOSTIC DATA (LABS, IMAGING, TESTING) - I reviewed patient records, labs, notes, testing and imaging myself where available.  Lab Results  Component Value Date   WBC 9.2 07/03/2023   HGB 14.7 07/03/2023   HCT 46.6 07/03/2023   MCV 96.3 07/03/2023   PLT 215 07/03/2023      Component Value Date/Time   NA 138 07/03/2023 1016   NA 141 08/04/2017 1047   K 4.0 07/03/2023 1016   CL 108 07/03/2023 1016   CO2 22 07/03/2023 1016   GLUCOSE 99 07/03/2023 1016   BUN 32 (H) 07/03/2023 1016   BUN 19 08/04/2017 1047   CREATININE 1.11 07/03/2023 1016   CALCIUM   8.8 (L) 07/03/2023 1016   CALCIUM  9.2 05/19/2023 0000   PROT 6.7 07/29/2019 1337   PROT 6.4 07/08/2016 0842   ALBUMIN 3.8 07/29/2019 1337   ALBUMIN 4.0 07/08/2016 0842   AST 24 07/29/2019 1337   ALT 23 07/29/2019 1337   ALKPHOS 66 07/29/2019 1337   BILITOT 1.0 07/29/2019 1337   BILITOT 0.7 07/08/2016 0842   GFRNONAA >60 07/03/2023 1016   GFRAA >60 08/10/2019 0323   Lab Results  Component Value Date   CHOL 170 07/08/2016   HDL 61 07/08/2016   LDLCALC 101 (H) 07/08/2016   TRIG 42 07/08/2016   CHOLHDL 2.8 07/08/2016   Lab Results  Component Value Date   HGBA1C 6.0 (H) 08/09/2019   No results found for:  VITAMINB12 No results found for: TSH  PHYSICAL EXAM:  Vitals:   10/01/23 0928  BP: 128/81  Pulse: (!) 59   No data found. Body mass index is 29.27 kg/m.   Wt Readings from Last 3 Encounters:  10/01/23 204 lb (92.5 kg)  07/09/23 208 lb (94.3 kg)  07/07/23 208 lb (94.3 kg)     Ht Readings from Last 3 Encounters:  10/01/23 5' 10 (1.778 m)  07/09/23 5' 11 (1.803 m)  07/07/23 5' 11 (1.803 m)      General: The patient is awake, alert and appears not in acute distress. The patient is groomed. Head: Normocephalic, atraumatic. Neck is supple.  Heard of hearing .  Mallampati 2,  neck circumference:18.25 inches . Nasal airflow  patent.  Retrognathia is not seen.  Dental status: partial  Cardiovascular:  Regular rate and cardiac rhythm by pulse,  without distended neck veins. Respiratory: Lungs are clear to auscultation.  Skin:  With evidence of ankle edema. Bilaterally puffy feet.  Trunk: The patient's posture is erect.   NEUROLOGIC EXAM: The patient is awake and alert, oriented to place and time.   Memory subjective described as intact.  Attention span & concentration ability appears normal.  Speech is fluent,  without dysarthria, dysphonia or aphasia.  Mood and affect are appropriate.   Cranial nerves: no loss of smell or taste reported  Pupils are equal and briskly reactive to light. Funduscopic exam deferred.  Extraocular movements in vertical and horizontal planes were intact and without nystagmus.  No Diplopia. Visual fields by finger perimetry are intact. Hearing was impaired- bilaterally     Facial sensation intact to fine touch.  Facial motor strength is symmetric and tongue and uvula move midline.  Neck ROM : rotation, tilt and flexion extension were normal for age and shoulder shrug was symmetrical.    Motor exam:  Symmetric bulk, tone and ROM.   Elevated base tone without cog -wheeling, protective ? There is left lesser  grip strength .   Sensory:   Fine touch,  and vibration were felt reduced at either ankle. He reports general numbness , after  tennis games and even at night- both feet.  Proprioception tested in the upper extremities was normal.   Coordination: Rapid alternating movements in the fingers/hands were of normal speed.  The Finger-to-nose maneuver was intact without evidence of ataxia, dysmetria or tremor. Gait and station: Patient could rise unassisted from a seated position, walked without assistive device.   Stance is of normal width/ base and the patient turned with 3 steps.  Toe and heel walk were deferred.  Deep tendon reflexes: in the  upper and lower extremities are symmetric and intact.  Patella reflex attenuated  TKR bilaterally, shoulder surgery in March  2025,other shoulder surgery pending in August.     ASSESSMENT AND PLAN :   83 y.o. year old male  here with:  central apnea - complex, and did well on BIPAP .  1) Need to return to Bipap-    2) Neuropathy - profound sensory - there is a new deformity of the  left foot's seconds and third toe, scissoring  and ankle edema and delayed refill capillary.  This is a new development over the  last 12 months .   He reports profound numbness of the fee and yet feels pain. I can offer gabapentin, 100 mg at night.   Wear diabetic socks and soft surface orthotics. NCV and EMG ordered.     I will send a new order for BIPP to [facility].         I would like to thank Jeannine Milroy., MD and Jeannine Milroy., Md [address] [place],  [place] [address] for allowing me to meet with and to take care of this pleasant patient.   The patient's condition requires frequent monitoring and adjustments in the treatment plan, reflecting the ongoing complexity of care.  This provider is the continuing focal point for all needed services for this condition.  After spending a total time of  35  minutes face to face and time for  history taking, physical and neurologic  examination, review of laboratory studies,  personal review of imaging studies, reports and results of other testing and review of referral information / records as far as provided in visit,   Electronically signed by: Neomia Banner, MD 10/01/2023 10:04 AM  [facility] Board certified by The [employer] of Sleep Medicine and Diplomate of the [employer] of [employer] Medicine. Board certified In Neurology through the ABPN, Fellow of the [employer] of Neu[employer].

## 2023-10-02 ENCOUNTER — Ambulatory Visit: Admitting: Podiatry

## 2023-10-02 DIAGNOSIS — M19071 Primary osteoarthritis, right ankle and foot: Secondary | ICD-10-CM | POA: Diagnosis not present

## 2023-10-02 DIAGNOSIS — L97522 Non-pressure chronic ulcer of other part of left foot with fat layer exposed: Secondary | ICD-10-CM

## 2023-10-02 DIAGNOSIS — Z96612 Presence of left artificial shoulder joint: Secondary | ICD-10-CM | POA: Diagnosis not present

## 2023-10-02 DIAGNOSIS — M25612 Stiffness of left shoulder, not elsewhere classified: Secondary | ICD-10-CM | POA: Diagnosis not present

## 2023-10-02 NOTE — Progress Notes (Unsigned)
 Subjective:  Patient ID: Glenn [name], male    DOB: 1940/06/18,  MRN: [id number]  Chief Complaint  Patient presents with   Foot Ulcer    83 y.o. male presents with the above complaint. Patient presents with left second digit ulceration.  He states that is doing okay denies any other acute complaints.  Is about the same.  Denies any other acute complaints.  He has secondary complaint of right dorsal midfoot pain.  He would like to do a steroid shot   Review of Systems: Negative except as noted in the HPI. Denies N/V/F/Ch.  Past Medical History:  Diagnosis Date   Atrial fibrillation (HCC)    takes aspirin  for this   Brachial plexus injury 1995   Left arm   Complication of anesthesia    L arm brachial was stretched and Left arm was paralyzed for 4 months   Dysrhythmia 2009   A-fib   HTN (hypertension)    6 years   Injury of brachial plexus    Prostate cancer (HCC)    Prostate cancer (HCC)    Sleep apnea    Cpap   Torn rotator cuff    Torn rotator cuff 2000   Right arm    Current Outpatient Medications:    Acetaminophen  (TYLENOL ) 325 MG CAPS, Take 325 mg by mouth as needed (pain)., Disp: , Rfl:    amoxicillin (AMOXIL) 500 MG capsule, Take 2,000 mg by mouth See admin instructions. Take 4 capsules (2000 mg) by mouth 1 hour prior to dental procedures., Disp: , Rfl:    apixaban  (ELIQUIS ) 5 MG TABS tablet, Take 1 tablet (5 mg total) by mouth 2 (two) times daily., Disp: 180 tablet, Rfl: 1   atorvastatin  (LIPITOR) 10 MG tablet, TAKE 1 TABLET BY MOUTH DAILY, Disp: 90 tablet, Rfl: 3   gabapentin  (NEURONTIN ) 100 MG capsule, Take 1 capsule (100 mg total) by mouth at bedtime., Disp: 30 capsule, Rfl: 5   hydrochlorothiazide  (HYDRODIURIL ) 25 MG tablet, TAKE 1 TABLET BY MOUTH DAILY, Disp: 90 tablet, Rfl: 3   lisinopril  (ZESTRIL ) 40 MG tablet, TAKE 1 TABLET BY MOUTH DAILY, Disp: 90 tablet, Rfl: 3   oxyCODONE  (ROXICODONE ) 5 MG immediate release tablet, Take 1 tablet (5 mg total) by mouth every 4  (four) hours as needed. (Patient not taking: Reported on 10/01/2023), Disp: 30 tablet, Rfl: 0   silver  sulfADIAZINE  (SILVADENE ) 1 % cream, Apply 1 Application topically daily., Disp: 50 g, Rfl: 0   tetrahydrozoline 0.05 % ophthalmic solution, Place 1-2 drops into both eyes daily as needed (dry/irritated eyes.)., Disp: , Rfl:    tiZANidine  (ZANAFLEX ) 4 MG tablet, Take 1 tablet (4 mg total) by mouth every 8 (eight) hours as needed for muscle spasms. (Patient not taking: Reported on 10/01/2023), Disp: 30 tablet, Rfl: 0   Vibegron (GEMTESA) 75 MG TABS, Take by mouth. (Patient not taking: Reported on 10/01/2023), Disp: , Rfl:   Social History   Tobacco Use  Smoking Status Former   Current packs/day: 0.00   Average packs/day: 0.5 packs/day for 15.0 years (7.5 ttl pk-yrs)   Types: Cigarettes   Start date: 04/21/1970   Quit date: 04/21/1985   Years since quitting: 38.4  Smokeless Tobacco Never    No Known Allergies Objective:  There were no vitals filed for this visit. There is no height or weight on file to calculate BMI. Constitutional Well developed. Well nourished.  Vascular Dorsalis pedis pulses palpable bilaterally. Posterior tibial pulses palpable bilaterally. Capillary refill normal to all  digits.  No cyanosis or clubbing noted. Pedal hair growth normal.  Neurologic Normal speech. Oriented to person, place, and time. Epicritic sensation to light touch grossly present bilaterally.  Dermatologic Left second digit ulceration fat layer exposed.  Pain on palpation.  No purulent drainage noted.  No erythema noted.  Does not probe down to bone.  Not purulent drainage noted  Pain palpation of right midfoot pain at the palpable arthritic changes.  Underlying capsulitis clinically appreciated.  No pain at the extensor tendon  Orthopedic: Normal joint ROM without pain or crepitus bilaterally. No visible deformities. No bony tenderness.   Radiographs: None Assessment:   1. Toe ulcer, left,  with fat layer exposed (HCC)   2. Arthritis of right midfoot     Plan:  Patient was evaluated and treated and all questions answered.  Left second digit ulceration fat layer exposed - All questions and concerns were discussed with the patient in extensive detail encouraged him to continue Silvadene  wet-to-dry dressing to cover the ulceration.  At this time he will continue to do the same. - Continue shoe gear modification  Right midfoot arthritis - Given the amount of pain that he is having he will benefit from steroid injection help decreasing inflammatory component surgical pain.  Patient agrees with plan to proceed with steroid injection -A steroid injection was performed at Right midfoot using 1% plain Lidocaine  and 10 mg of Kenalog . This was well tolerated.   No follow-ups on file.

## 2023-10-05 DIAGNOSIS — M25612 Stiffness of left shoulder, not elsewhere classified: Secondary | ICD-10-CM | POA: Diagnosis not present

## 2023-10-13 ENCOUNTER — Telehealth: Payer: Self-pay | Admitting: Neurology

## 2023-10-13 NOTE — Telephone Encounter (Signed)
 Appointment details confirmed For NCV/EMG

## 2023-10-14 ENCOUNTER — Ambulatory Visit: Admitting: Neurology

## 2023-10-20 DIAGNOSIS — M25651 Stiffness of right hip, not elsewhere classified: Secondary | ICD-10-CM | POA: Diagnosis not present

## 2023-10-20 DIAGNOSIS — M25652 Stiffness of left hip, not elsewhere classified: Secondary | ICD-10-CM | POA: Diagnosis not present

## 2023-10-20 DIAGNOSIS — N3946 Mixed incontinence: Secondary | ICD-10-CM | POA: Diagnosis not present

## 2023-10-20 DIAGNOSIS — M6289 Other specified disorders of muscle: Secondary | ICD-10-CM | POA: Diagnosis not present

## 2023-10-20 DIAGNOSIS — N39492 Postural (urinary) incontinence: Secondary | ICD-10-CM | POA: Diagnosis not present

## 2023-10-20 DIAGNOSIS — N3942 Incontinence without sensory awareness: Secondary | ICD-10-CM | POA: Diagnosis not present

## 2023-11-11 ENCOUNTER — Ambulatory Visit: Admitting: Podiatry

## 2023-11-11 DIAGNOSIS — L97522 Non-pressure chronic ulcer of other part of left foot with fat layer exposed: Secondary | ICD-10-CM

## 2023-11-11 NOTE — Progress Notes (Signed)
 Subjective:  Patient ID: Glenn [name], male    DOB: 1940-04-29,  MRN: [id number]  Chief Complaint  Patient presents with   Toe Pain    Left foot toe ulcer     83 y.o. male presents with the above complaint. Patient presents with left second digit ulceration.  He states that is doing okay denies any other acute complaints.  Is about the same.  Denies any other acute complaints.  He has secondary complaint of right dorsal midfoot pain.  He would like to do a steroid shot   Review of Systems: Negative except as noted in the HPI. Denies N/V/F/Ch.  Past Medical History:  Diagnosis Date   Atrial fibrillation (HCC)    takes aspirin  for this   Brachial plexus injury 1995   Left arm   Complication of anesthesia    L arm brachial was stretched and Left arm was paralyzed for 4 months   Dysrhythmia 2009   A-fib   HTN (hypertension)    6 years   Injury of brachial plexus    Prostate cancer (HCC)    Prostate cancer (HCC)    Sleep apnea    Cpap   Torn rotator cuff    Torn rotator cuff 2000   Right arm    Current Outpatient Medications:    Acetaminophen  (TYLENOL ) 325 MG CAPS, Take 325 mg by mouth as needed (pain)., Disp: , Rfl:    amoxicillin (AMOXIL) 500 MG capsule, Take 2,000 mg by mouth See admin instructions. Take 4 capsules (2000 mg) by mouth 1 hour prior to dental procedures., Disp: , Rfl:    apixaban  (ELIQUIS ) 5 MG TABS tablet, Take 1 tablet (5 mg total) by mouth 2 (two) times daily., Disp: 180 tablet, Rfl: 1   atorvastatin  (LIPITOR) 10 MG tablet, TAKE 1 TABLET BY MOUTH DAILY, Disp: 90 tablet, Rfl: 3   gabapentin  (NEURONTIN ) 100 MG capsule, Take 1 capsule (100 mg total) by mouth at bedtime., Disp: 30 capsule, Rfl: 5   hydrochlorothiazide  (HYDRODIURIL ) 25 MG tablet, TAKE 1 TABLET BY MOUTH DAILY, Disp: 90 tablet, Rfl: 3   lisinopril  (ZESTRIL ) 40 MG tablet, TAKE 1 TABLET BY MOUTH DAILY, Disp: 90 tablet, Rfl: 3   oxyCODONE  (ROXICODONE ) 5 MG immediate release tablet, Take 1 tablet (5 mg  total) by mouth every 4 (four) hours as needed. (Patient not taking: Reported on 10/01/2023), Disp: 30 tablet, Rfl: 0   silver  sulfADIAZINE  (SILVADENE ) 1 % cream, Apply 1 Application topically daily., Disp: 50 g, Rfl: 0   tetrahydrozoline 0.05 % ophthalmic solution, Place 1-2 drops into both eyes daily as needed (dry/irritated eyes.)., Disp: , Rfl:    tiZANidine  (ZANAFLEX ) 4 MG tablet, Take 1 tablet (4 mg total) by mouth every 8 (eight) hours as needed for muscle spasms. (Patient not taking: Reported on 10/01/2023), Disp: 30 tablet, Rfl: 0   Vibegron (GEMTESA) 75 MG TABS, Take by mouth. (Patient not taking: Reported on 10/01/2023), Disp: , Rfl:   Social History   Tobacco Use  Smoking Status Former   Current packs/day: 0.00   Average packs/day: 0.5 packs/day for 15.0 years (7.5 ttl pk-yrs)   Types: Cigarettes   Start date: 04/21/1970   Quit date: 04/21/1985   Years since quitting: 38.5  Smokeless Tobacco Never    No Known Allergies Objective:  There were no vitals filed for this visit. There is no height or weight on file to calculate BMI. Constitutional Well developed. Well nourished.  Vascular Dorsalis pedis pulses palpable bilaterally. Posterior tibial  pulses palpable bilaterally. Capillary refill normal to all digits.  No cyanosis or clubbing noted. Pedal hair growth normal.  Neurologic Normal speech. Oriented to person, place, and time. Epicritic sensation to light touch grossly present bilaterally.  Dermatologic Left second digit ulceration fat layer exposed.  Pain on palpation.  No purulent drainage noted.  No erythema noted.  Does not probe down to bone.  Not purulent drainage noted  No further pain of the midfoot  Orthopedic: Normal joint ROM without pain or crepitus bilaterally. No visible deformities. No bony tenderness.   Radiographs: None Assessment:   1. Toe ulcer, left, with fat layer exposed (HCC)      Plan:  Patient was evaluated and treated and all questions  answered.  Left second digit ulceration fat layer exposed - All questions and concerns were discussed with the patient in extensive detail encouraged him to continue Silvadene  wet-to-dry dressing to cover the ulceration.  At this time he will continue to do the same. - Continue shoe gear modification  Right midfoot arthritis - Clinically doing better.  No follow-ups on file.

## 2023-11-13 ENCOUNTER — Encounter: Payer: Self-pay | Admitting: Neurology

## 2023-11-13 ENCOUNTER — Encounter: Admitting: Neurology

## 2023-11-17 ENCOUNTER — Telehealth: Payer: Self-pay | Admitting: Neurology

## 2023-11-17 NOTE — Telephone Encounter (Signed)
 Pt has called to r/s his NCS

## 2023-11-30 ENCOUNTER — Other Ambulatory Visit: Payer: Self-pay | Admitting: Physician Assistant

## 2023-12-08 DIAGNOSIS — K4090 Unilateral inguinal hernia, without obstruction or gangrene, not specified as recurrent: Secondary | ICD-10-CM | POA: Diagnosis not present

## 2023-12-08 DIAGNOSIS — R1032 Left lower quadrant pain: Secondary | ICD-10-CM | POA: Diagnosis not present

## 2023-12-14 ENCOUNTER — Telehealth: Payer: Self-pay | Admitting: Neurology

## 2023-12-14 NOTE — Telephone Encounter (Signed)
 Received a notification that [facility] has attempted to reach out several times to get him set up for the new machine that was ordered in June. Pt will need to contact [facility]. A letter was also sent in the mail to the patient advising him to reach out to them as well.

## 2023-12-16 ENCOUNTER — Encounter: Payer: Self-pay | Admitting: Internal Medicine

## 2023-12-16 ENCOUNTER — Ambulatory Visit: Attending: Cardiovascular Disease | Admitting: Internal Medicine

## 2023-12-16 VITALS — BP 132/80 | HR 48 | Ht 70.0 in | Wt 201.3 lb

## 2023-12-16 DIAGNOSIS — I4821 Permanent atrial fibrillation: Secondary | ICD-10-CM | POA: Diagnosis not present

## 2023-12-16 NOTE — Progress Notes (Addendum)
 HPI Glenn [name] returns today for followup of his atrial fib and HTN. He is a pleasant 83 yo man with longstanding atrial fib, rate controlled and chronic anti-coagulation. He remains active exercising. He has dyslipidemia and is on lipitor. Since his last visit, he has undergone shoulder replacement. He notes some slowing with age but denies other symptoms.  No Known Allergies   Current Outpatient Medications  Medication Sig Dispense Refill   Acetaminophen  (TYLENOL ) 325 MG CAPS Take 325 mg by mouth as needed (pain).     amoxicillin (AMOXIL) 500 MG capsule Take 2,000 mg by mouth See admin instructions. Take 4 capsules (2000 mg) by mouth 1 hour prior to dental procedures.     apixaban  (ELIQUIS ) 5 MG TABS tablet Take 1 tablet (5 mg total) by mouth 2 (two) times daily. 180 tablet 1   atorvastatin  (LIPITOR) 10 MG tablet TAKE 1 TABLET BY MOUTH DAILY 90 tablet 3   gabapentin  (NEURONTIN ) 100 MG capsule Take 1 capsule (100 mg total) by mouth at bedtime. 30 capsule 5   hydrochlorothiazide  (HYDRODIURIL ) 25 MG tablet TAKE 1 TABLET BY MOUTH DAILY 90 tablet 0   lisinopril  (ZESTRIL ) 40 MG tablet TAKE 1 TABLET BY MOUTH DAILY 90 tablet 3   oxyCODONE  (ROXICODONE ) 5 MG immediate release tablet Take 1 tablet (5 mg total) by mouth every 4 (four) hours as needed. 30 tablet 0   silver  sulfADIAZINE  (SILVADENE ) 1 % cream Apply 1 Application topically daily. 50 g 0   tetrahydrozoline 0.05 % ophthalmic solution Place 1-2 drops into both eyes daily as needed (dry/irritated eyes.).     tiZANidine  (ZANAFLEX ) 4 MG tablet Take 1 tablet (4 mg total) by mouth every 8 (eight) hours as needed for muscle spasms. 30 tablet 0   Vibegron (GEMTESA) 75 MG TABS Take by mouth.     No current facility-administered medications for this visit.     Past Medical History:  Diagnosis Date   Atrial fibrillation [facility])    takes aspirin  for this   Brachial plexus injury 1995   Left arm   Complication of anesthesia    L arm brachial  was stretched and Left arm was paralyzed for 4 months   Dysrhythmia 2009   A-fib   HTN (hypertension)    6 years   Injury of brachial plexus    Prostate cancer (HCC)    Prostate cancer (HCC)    Sleep apnea    Cpap   Torn rotator cuff    Torn rotator cuff 2000   Right arm    ROS:   All systems reviewed and negative except as noted in the HPI.   Past Surgical History:  Procedure Laterality Date   ETT  2006   High fitness but bp of 255   Int fixation left forearm  01/19/1989   KNEE SURGERY     Arthroplasty Left   MOHS SURGERY     PROSTATECTOMY     1995   removal of basal cell     REVERSE SHOULDER ARTHROPLASTY Left 07/09/2023   Procedure: ARTHROPLASTY, SHOULDER, TOTAL, REVERSE;  Surgeon: Dozier Soulier, MD;  Location: [facility];  Service: Orthopedics;  Laterality: Left;   TONSILLECTOMY     TOTAL KNEE ARTHROPLASTY Right 08/09/2019   Procedure: TOTAL KNEE ARTHROPLASTY;  Surgeon: Ernie Cough, MD;  Location: [facility];  Service: Orthopedics;  Laterality: Right;  70 mins     Family History  Problem Relation Age of Onset   Heart attack Father  MI   Obesity Brother    Parkinsonism Brother    Prostate cancer Brother    Colon cancer Neg Hx    Esophageal cancer Neg Hx    Stomach cancer Neg Hx    Rectal cancer Neg Hx      Social History   Socioeconomic History   Marital status: Married    Spouse name: Not on file   Number of children: 3   Years of education: Not on file   Highest education level: Not on file  Occupational History   Occupation: Engineer, drilling: Retired  Tobacco Use   Smoking status: Former    Current packs/day: 0.00    Average packs/day: 0.5 packs/day for 15.0 years (7.5 ttl pk-yrs)    Types: Cigarettes    Start date: 04/21/1970    Quit date: 04/21/1985    Years since quitting: 38.6   Smokeless tobacco: Never  Vaping Use   Vaping status: Never Used  Substance and Sexual Activity   Alcohol  use: Yes    Comment: Social   Drug use: No    Sexual activity: Not Currently  Other Topics Concern   Not on file  Social History Narrative   works as Veterinary surgeon;    active Media planner played BB at [employer] graduated 1964 after 2 final fours   married to [place]; 2 male and 1 male children;    multiple grandkids;    non smoker - quit 25 years ago   social ETOH   Social Drivers of Corporate investment banker Strain: Not on file  Food Insecurity: Low Risk  (07/30/2023)   Received from [facility]   Hunger Vital Sign    Within the past 12 months, you worried that your food would run out before you got money to buy more: Never true    Within the past 12 months, the food you bought just didn't last and you didn't have money to get more. : Never true  Transportation Needs: No Transportation Needs (07/30/2023)   Received from [facility]    In the past 12 months, has lack of reliable transportation kept you from medical appointments, meetings, work or from getting things needed for daily living? : No  Physical Activity: Not on file  Stress: Not on file  Social Connections: Not on file  Intimate Partner Violence: Not on file     BP 132/80   Pulse (!) 48   Ht 5' 10 (1.778 m)   Wt 201 lb 4.8 oz (91.3 kg)   SpO2 97%   BMI 28.88 kg/m   Physical Exam:  Well appearing NAD HEENT: Unremarkable Neck:  No JVD, no thyromegally Lymphatics:  No adenopathy Back:  No CVA tenderness Lungs:  Clear with no wheezes HEART:  Regular rate rhythm, no murmurs, no rubs, no clicks Abd:  soft, positive bowel sounds, no organomegally, no rebound, no guarding Ext:  2 plus pulses, no edema, no cyanosis, no clubbing Skin:  No rashes no nodules Neuro:  CN II through XII intact, motor grossly intact  EKG - afib with a slow VR  Assess/Plan:  Atrial fib - his VR is well controlled and actually a little slow. He may eventually need a PPM. No change in his meds. Coags - he will continue eliquis . He has not had any bleeding. HTN - his bp  is fairly well controlled at home though a little high in the office today. Preop - he is pending hernia surgery.  He is an acceptable surgical risk. He may stop his eliquis  3 days prior to surgery. Restart eliquis  when the post op bleeding risk is acceptable.     Danelle Gracelee Stemmler,MD

## 2023-12-16 NOTE — Patient Instructions (Addendum)

## 2023-12-17 DIAGNOSIS — M6289 Other specified disorders of muscle: Secondary | ICD-10-CM | POA: Diagnosis not present

## 2023-12-17 DIAGNOSIS — N3942 Incontinence without sensory awareness: Secondary | ICD-10-CM | POA: Diagnosis not present

## 2023-12-17 DIAGNOSIS — M25651 Stiffness of right hip, not elsewhere classified: Secondary | ICD-10-CM | POA: Diagnosis not present

## 2023-12-17 DIAGNOSIS — M25652 Stiffness of left hip, not elsewhere classified: Secondary | ICD-10-CM | POA: Diagnosis not present

## 2023-12-17 DIAGNOSIS — N39492 Postural (urinary) incontinence: Secondary | ICD-10-CM | POA: Diagnosis not present

## 2023-12-17 DIAGNOSIS — N3946 Mixed incontinence: Secondary | ICD-10-CM | POA: Diagnosis not present

## 2023-12-18 ENCOUNTER — Ambulatory Visit: Payer: Self-pay | Admitting: General Surgery

## 2023-12-18 ENCOUNTER — Telehealth: Payer: Self-pay

## 2023-12-18 DIAGNOSIS — K4090 Unilateral inguinal hernia, without obstruction or gangrene, not specified as recurrent: Secondary | ICD-10-CM | POA: Diagnosis not present

## 2023-12-18 NOTE — H&P (Signed)
 Chief Complaint: New Consultation       History of Present Illness: Glenn [name] is a 83 y.o. male who is seen today as an office consultation at the request of Dr. Sammi for evaluation of New Consultation .   History of Present Illness Glenn [name] is an 83 year old male who presents for evaluation of surgical repair of a hernia. He was referred by Dr. Loreli for evaluation of his hernia.   Six months ago, he experienced burning groin pain while playing tennis, which recurred and led to a hernia diagnosis via ultrasound. He notices bilateral bulges, attributing them to weight gain after shoulder replacement surgery two months ago. Recent increased activity during a vacation, such as carrying luggage, exacerbated the pain.   He underwent retropubic prostate surgery in 1995 with a significant abdominal incision. He is on Eliquis  for atrial fibrillation.   The hernia pain worsens with bowel fullness and improves post-bowel movement. It sometimes impedes walking and is more pronounced when sitting than during physical activities like tennis or golf.       Review of Systems: A complete review of systems was obtained from the patient.  I have reviewed this information and discussed as appropriate with the patient.  See HPI as well for other ROS.   Review of Systems  Constitutional:  Negative for fever.  HENT:  Negative for congestion.   Eyes:  Negative for blurred vision.  Respiratory:  Negative for cough, shortness of breath and wheezing.   Cardiovascular:  Negative for chest pain and palpitations.  Gastrointestinal:  Negative for heartburn.  Genitourinary:  Negative for dysuria.  Musculoskeletal:  Negative for myalgias.  Skin:  Negative for rash.  Neurological:  Negative for dizziness and headaches.  Psychiatric/Behavioral:  Negative for depression and suicidal ideas.   All other systems reviewed and are negative.       Medical History: Past Medical History Past Medical  History: Diagnosis Date  History of cancer    Hypertension    Sleep apnea         Problem List There is no problem list on file for this patient.     Past Surgical History Past Surgical History: Procedure Laterality Date  JOINT REPLACEMENT          Allergies No Known Allergies    Medications Ordered Prior to Encounter Current Outpatient Medications on File Prior to Visit Medication Sig Dispense Refill  acetaminophen  (TYLENOL ) 325 mg Cap Take 325 mg by mouth      amLODIPine  (NORVASC ) 5 MG tablet Take 5 mg by mouth once daily      apixaban  (ELIQUIS ) 5 mg tablet Take 5 mg by mouth 2 (two) times daily      atorvastatin  (LIPITOR) 10 MG tablet Take 10 mg by mouth once daily      gabapentin  (NEURONTIN ) 100 MG capsule Take 100 mg by mouth at bedtime      hydroCHLOROthiazide  (HYDRODIURIL ) 25 MG tablet Take 25 mg by mouth once daily      lisinopriL  (ZESTRIL ) 40 MG tablet Take 40 mg by mouth once daily      oxyCODONE  (ROXICODONE ) 5 MG immediate release tablet Take 5 mg by mouth every 4 (four) hours as needed      silver  sulfADIAZINE  (SSD) 1 % cream Apply 1 Application topically      tetrahydrozoline (VISINE) 0.05 % ophthalmic solution Apply 1-2 drops to eye      tiZANidine  (ZANAFLEX ) 4 MG tablet Take 4,000 mg by  mouth every 8 (eight) hours as needed for Muscle spasms      vibegron (GEMTESA) 75 mg Tab Take by mouth        No current facility-administered medications on file prior to visit.      Family History Family History Problem Relation Age of Onset  High blood pressure (Hypertension) Mother    Breast cancer Mother    Myocardial Infarction (Heart attack) Father        Tobacco Use History Social History    Tobacco Use Smoking Status Former  Current packs/day: 0.00  Types: Cigarettes  Quit date: 1985  Years since quitting: 40.6 Smokeless Tobacco Never      Social History Social History    Socioeconomic History  Marital status: Married Tobacco  Use  Smoking status: Former     Current packs/day: 0.00     Types: Cigarettes     Quit date: 1985     Years since quitting: 40.6  Smokeless tobacco: Never Vaping Use  Vaping status: Never Used Substance and Sexual Activity  Alcohol  use: Yes     Alcohol /week: 2.0 - 6.0 standard drinks of alcohol      Types: 2 - 6 Standard drinks or equivalent per week  Drug use: Never    Social Drivers of Health    Food Insecurity: Low Risk  (07/30/2023)   Received from [facility]   Hunger Vital Sign    Within the past 12 months, you worried that your food would run out before you got money to buy more: Never true    Within the past 12 months, the food you bought just didn't last and you didn't have money to get more. : Never true Transportation Needs: No Transportation Needs (07/30/2023)   Received from [facility]    In the past 12 months, has lack of reliable transportation kept you from medical appointments, meetings, work or from getting things needed for daily living? : No Housing Stability: Unknown (12/18/2023)   Housing Stability Vital Sign    Homeless in the Last Year: No      Objective:     Vitals:   12/18/23 1031 BP: (!) 160/91 Pulse: 50 Temp: 36.7 C (98 F) SpO2: 97% Weight: 90.5 kg (199 lb 9.6 oz) Height: 180.3 cm (5' 11) PainSc: 1    Body mass index is 27.84 kg/m. Physical Exam Constitutional:      Appearance: Normal appearance.  HENT:     Head: Normocephalic and atraumatic.     Nose: Nose normal. No congestion.     Mouth/Throat:     Mouth: Mucous membranes are moist.     Pharynx: Oropharynx is clear.  Eyes:     Pupils: Pupils are equal, round, and reactive to light.  Cardiovascular:     Rate and Rhythm: Normal rate and regular rhythm.     Pulses: Normal pulses.     Heart sounds: Normal heart sounds. No murmur heard.    No friction rub. No gallop.  Pulmonary:     Effort: Pulmonary effort is normal. No respiratory distress.     Breath  sounds: Normal breath sounds. No stridor. No wheezing, rhonchi or rales.  Abdominal:     General: Abdomen is flat.     Hernia: A hernia is present. Hernia is present in the left inguinal area. There is no hernia in the right inguinal area.  Musculoskeletal:        General: Normal range of motion.     Cervical  back: Normal range of motion.  Skin:    General: Skin is warm and dry.  Neurological:     General: No focal deficit present.     Mental Status: He is alert and oriented to person, place, and time.  Psychiatric:        Mood and Affect: Mood normal.        Thought Content: Thought content normal.         Assessment and Plan: Diagnoses and all orders for this visit:   Unilateral inguinal hernia without obstruction or gangrene, recurrence not specified     Glenn [name] is a 83 y.o. male  Will need cleareance to be off eliquis  from Dr. Waddell   1.  We will proceed to the OR for a OPEN LEFT inguinal hernia repair with mesh. 2. All risks and benefits were discussed with the patient, to generally include infection, bleeding, damage to surrounding structures, acute and chronic nerve pain, and recurrence. Alternatives were offered and described.  All questions were answered and the patient voiced understanding of the procedure and wishes to proceed at this point.             No follow-ups on file.   Lynda Leos, MD, [facility], [place] [facility]

## 2023-12-18 NOTE — Telephone Encounter (Signed)
 Patient with diagnosis of Afib on Eliquis  for anticoagulation.    Procedure: HERNIA SURGERY  Date of procedure: TBD   CHA2DS2-VASc Score = 3   This indicates a 3.2% annual risk of stroke. The patient's score is based upon: CHF History: 0 HTN History: 1 Diabetes History: 0 Stroke History: 0 Vascular Disease History: 0 Age Score: 2 Gender Score: 0    CrCl 58 mL/min  Platelet count 215 K  Patient has not had an Afib/aflutter ablation within the last 3 months or DCCV within the last 30 days   Per office protocol, patient can hold Eliquis  for 2 days prior to procedure.     **This guidance is not considered finalized until pre-operative APP has relayed final recommendations.**

## 2023-12-18 NOTE — Telephone Encounter (Signed)
 Dr. Waddell,  You saw this patient on 12/16/2023. Per protocol we request that you comment on his cardiac risk to proceed with Hernia surgery, since it has been less than 2 months since evaluated in the office. Pharmacy has weighed in on holding Eliquis . Please send your comment to P CV Pre-Op Pool.  Thank you, Lamarr Satterfield DNP, ANP, AACC.

## 2023-12-18 NOTE — Telephone Encounter (Signed)
   Pre-operative Risk Assessment    Patient Name: Glenn [name]  DOB: 12/29/1940 MRN: [id number]   Date of last office visit: 12/16/23 DANELLE BIRMINGHAM, MD Date of next office visit: NONE   Request for Surgical Clearance    Procedure:  HERNIA SURGERY  Date of Surgery:  Clearance TBD                                Surgeon:  LYNDA LEOS, MD Surgeon's Group or Practice Name:  [facility] Phone number:  [phone] Fax number:  [phone]  ATTN: ROSALINE SPRANG, CMA   Type of Clearance Requested:   - Medical  - Pharmacy:  Hold Apixaban  (Eliquis )     Type of Anesthesia:  General    Additional requests/questions:    Signed, Lucie DELENA Ku   12/18/2023, 11:15 AM

## 2023-12-18 NOTE — Telephone Encounter (Signed)
 Pharmacy please advise on holding Eliquis  prior to hernia surgery scheduled for TBD, last labs (BMET and CBC) were completed on 07/03/2023 . Thank you.

## 2023-12-21 DIAGNOSIS — R32 Unspecified urinary incontinence: Secondary | ICD-10-CM

## 2023-12-21 HISTORY — DX: Unspecified urinary incontinence: R32

## 2023-12-23 NOTE — Telephone Encounter (Signed)
 Routed Dr. Adrian updated office note that included preop clearance to requesting practice.   Rollo FABIENE Louder, PA-C 12/23/2023 11:20 AM

## 2023-12-25 ENCOUNTER — Ambulatory Visit: Admitting: Podiatry

## 2023-12-25 DIAGNOSIS — M19071 Primary osteoarthritis, right ankle and foot: Secondary | ICD-10-CM | POA: Diagnosis not present

## 2023-12-25 DIAGNOSIS — L97522 Non-pressure chronic ulcer of other part of left foot with fat layer exposed: Secondary | ICD-10-CM | POA: Diagnosis not present

## 2023-12-25 NOTE — Progress Notes (Signed)
 Subjective:  Patient ID: Glenn [name], male    DOB: 1940-09-01,  MRN: [id number]  Chief Complaint  Patient presents with   Foot Ulcer    Left foot toe ulcer follow up  Pt stated that he has a slight discomfort in his right foot     83 y.o. male presents with the above complaint. Patient presents with left second digit ulceration.  He states that is doing okay denies any other acute complaints.  Is about the same.  Denies any other acute complaints.  He has secondary complaint of right dorsal midfoot pain.  He would like to do a steroid shot   Review of Systems: Negative except as noted in the HPI. Denies N/V/F/Ch.  Past Medical History:  Diagnosis Date   Atrial fibrillation (HCC)    takes aspirin  for this   Brachial plexus injury 1995   Left arm   Complication of anesthesia    L arm brachial was stretched and Left arm was paralyzed for 4 months   Dysrhythmia 2009   A-fib   HTN (hypertension)    6 years   Injury of brachial plexus    Prostate cancer (HCC)    Prostate cancer (HCC)    Sleep apnea    Cpap   Torn rotator cuff    Torn rotator cuff 2000   Right arm    Current Outpatient Medications:    Acetaminophen  (TYLENOL ) 325 MG CAPS, Take 325 mg by mouth as needed (pain)., Disp: , Rfl:    amoxicillin (AMOXIL) 500 MG capsule, Take 2,000 mg by mouth See admin instructions. Take 4 capsules (2000 mg) by mouth 1 hour prior to dental procedures., Disp: , Rfl:    apixaban  (ELIQUIS ) 5 MG TABS tablet, Take 1 tablet (5 mg total) by mouth 2 (two) times daily., Disp: 180 tablet, Rfl: 1   atorvastatin  (LIPITOR) 10 MG tablet, TAKE 1 TABLET BY MOUTH DAILY, Disp: 90 tablet, Rfl: 3   gabapentin  (NEURONTIN ) 100 MG capsule, Take 1 capsule (100 mg total) by mouth at bedtime., Disp: 30 capsule, Rfl: 5   hydrochlorothiazide  (HYDRODIURIL ) 25 MG tablet, TAKE 1 TABLET BY MOUTH DAILY, Disp: 90 tablet, Rfl: 0   lisinopril  (ZESTRIL ) 40 MG tablet, TAKE 1 TABLET BY MOUTH DAILY, Disp: 90 tablet, Rfl: 3    oxyCODONE  (ROXICODONE ) 5 MG immediate release tablet, Take 1 tablet (5 mg total) by mouth every 4 (four) hours as needed., Disp: 30 tablet, Rfl: 0   silver  sulfADIAZINE  (SILVADENE ) 1 % cream, Apply 1 Application topically daily., Disp: 50 g, Rfl: 0   tetrahydrozoline 0.05 % ophthalmic solution, Place 1-2 drops into both eyes daily as needed (dry/irritated eyes.)., Disp: , Rfl:    tiZANidine  (ZANAFLEX ) 4 MG tablet, Take 1 tablet (4 mg total) by mouth every 8 (eight) hours as needed for muscle spasms., Disp: 30 tablet, Rfl: 0   Vibegron (GEMTESA) 75 MG TABS, Take by mouth., Disp: , Rfl:   Social History   Tobacco Use  Smoking Status Former   Current packs/day: 0.00   Average packs/day: 0.5 packs/day for 15.0 years (7.5 ttl pk-yrs)   Types: Cigarettes   Start date: 04/21/1970   Quit date: 04/21/1985   Years since quitting: 38.7  Smokeless Tobacco Never    No Known Allergies Objective:  There were no vitals filed for this visit. There is no height or weight on file to calculate BMI. Constitutional Well developed. Well nourished.  Vascular Dorsalis pedis pulses palpable bilaterally. Posterior tibial pulses palpable bilaterally.  Capillary refill normal to all digits.  No cyanosis or clubbing noted. Pedal hair growth normal.  Neurologic Normal speech. Oriented to person, place, and time. Epicritic sensation to light touch grossly present bilaterally.  Dermatologic Left second digit ulceration fat layer exposed.  Pain on palpation.  No purulent drainage noted.  No erythema noted.  Does not probe down to bone.  Not purulent drainage noted  No further pain of the midfoot  Orthopedic: Normal joint ROM without pain or crepitus bilaterally. No visible deformities. No bony tenderness.   Radiographs: None Assessment:   1. Toe ulcer, left, with fat layer exposed (HCC)   2. Arthritis of right midfoot       Plan:  Patient was evaluated and treated and all questions answered.  Left  second digit ulceration fat layer exposed - All questions and con  Subjective:  Patient ID: Glenn [name], male    DOB: [birth year],  MRN: [id number]  Chief Complaint  Patient presents with   Foot Ulcer    Left foot toe ulcer follow up  Pt stated that he has a slight discomfort in his right foot     83 y.o. male presents with the above complaint. Patient presents with left second digit ulceration.  He states that is doing okay denies any other acute complaints.  Is about the same.  Denies any other acute complaints.  He has secondary complaint of right dorsal midfoot pain.  He would like to do a steroid shot   Review of Systems: Negative except as noted in the HPI. Denies N/V/F/Ch.  Past Medical History:  Diagnosis Date   Atrial fibrillation [facility])    takes aspirin  for this   Brachial plexus injury 1995   Left arm   Complication of anesthesia    L arm brachial was stretched and Left arm was paralyzed for 4 months   Dysrhythmia 2009   A-fib   HTN (hypertension)    6 years   Injury of brachial plexus    Prostate cancer (HCC)    Prostate cancer (HCC)    Sleep apnea    Cpap   Torn rotator cuff    Torn rotator cuff 2000   Right arm    Current Outpatient Medications:    Acetaminophen  (TYLENOL ) 325 MG CAPS, Take 325 mg by mouth as needed (pain)., Disp: , Rfl:    amoxicillin (AMOXIL) 500 MG capsule, Take 2,000 mg by mouth See admin instructions. Take 4 capsules (2000 mg) by mouth 1 hour prior to dental procedures., Disp: , Rfl:    apixaban  (ELIQUIS ) 5 MG TABS tablet, Take 1 tablet (5 mg total) by mouth 2 (two) times daily., Disp: 180 tablet, Rfl: 1   atorvastatin  (LIPITOR) 10 MG tablet, TAKE 1 TABLET BY MOUTH DAILY, Disp: 90 tablet, Rfl: 3   gabapentin  (NEURONTIN ) 100 MG capsule, Take 1 capsule (100 mg total) by mouth at bedtime., Disp: 30 capsule, Rfl: 5   hydrochlorothiazide  (HYDRODIURIL ) 25 MG tablet, TAKE 1 TABLET BY MOUTH DAILY, Disp: 90 tablet, Rfl: 0   lisinopril  (ZESTRIL ) 40 MG  tablet, TAKE 1 TABLET BY MOUTH DAILY, Disp: 90 tablet, Rfl: 3   oxyCODONE  (ROXICODONE ) 5 MG immediate release tablet, Take 1 tablet (5 mg total) by mouth every 4 (four) hours as needed., Disp: 30 tablet, Rfl: 0   silver  sulfADIAZINE  (SILVADENE ) 1 % cream, Apply 1 Application topically daily., Disp: 50 g, Rfl: 0   tetrahydrozoline 0.05 % ophthalmic solution, Place 1-2 drops into both eyes daily as  needed (dry/irritated eyes.)., Disp: , Rfl:    tiZANidine  (ZANAFLEX ) 4 MG tablet, Take 1 tablet (4 mg total) by mouth every 8 (eight) hours as needed for muscle spasms., Disp: 30 tablet, Rfl: 0   Vibegron (GEMTESA) 75 MG TABS, Take by mouth., Disp: , Rfl:   Social History   Tobacco Use  Smoking Status Former   Current packs/day: 0.00   Average packs/day: 0.5 packs/day for 15.0 years (7.5 ttl pk-yrs)   Types: Cigarettes   Start date: 04/21/1970   Quit date: 04/21/1985   Years since quitting: 38.7  Smokeless Tobacco Never    No Known Allergies Objective:  There were no vitals filed for this visit. There is no height or weight on file to calculate BMI. Constitutional Well developed. Well nourished.  Vascular Dorsalis pedis pulses palpable bilaterally. Posterior tibial pulses palpable bilaterally. Capillary refill normal to all digits.  No cyanosis or clubbing noted. Pedal hair growth normal.  Neurologic Normal speech. Oriented to person, place, and time. Epicritic sensation to light touch grossly present bilaterally.  Dermatologic Left second digit ulceration completely reepithelialized no further signs of ulcer noted.  Pain palpation of right midfoot pain at the palpable arthritic changes.  Underlying capsulitis clinically appreciated.  No pain at the extensor tendon  Orthopedic: Normal joint ROM without pain or crepitus bilaterally. No visible deformities. No bony tenderness.   Radiographs: None Assessment:   1. Toe ulcer, left, with fat layer exposed (HCC)   2. Arthritis of right  midfoot      Plan:  Patient was evaluated and treated and all questions answered.  Left second digit ulceration fat layer exposed - Clinically healed and officially discharged from my care I discussed prevention technique he states understanding.  Denies any other acute complaints Sugar modification discussed  Right midfoot arthritis - Given the amount of pain that he is having he will benefit from steroid injection help decreasing inflammatory component surgical pain.  Patient agrees with plan to proceed with steroid injection -A steroid injection was performed at Right midfoot using 1% plain Lidocaine  and 10 mg of Kenalog . This was well tolerated.     Right midfoot arthritis - Clinically doing better.  No follow-ups on file.

## 2024-01-06 DIAGNOSIS — M25612 Stiffness of left shoulder, not elsewhere classified: Secondary | ICD-10-CM | POA: Diagnosis not present

## 2024-01-12 NOTE — Progress Notes (Signed)
 No chief complaint on file.   HPI:   Glenn [name] is a very nice 83 year old gentleman who presents with urinary incontinence. He underwent a radical retropubic prostatectomy for prostate cancer in 1999 by Dr. Arvis at [facility]. His PSAs have been 0 since then and he has done remarkably well. He does not have problems with erectile dysfunction but the urinary incontinence has worsened. He notes that it was initially just mild stress incontinence but is now worsened to the fact that he is having to wear pads. He is not sure if it is stress incontinence or mixed incontinence.  He underwent the urodynamics noted below.  He was found to have a hypotonic detrusor and felt to have voiding dysfunction.  At last visit we recommended timed-voiding and pelvic floor physical therapy.  By record he is on BiPAP for obstructive sleep apnea.  He has an inguinal hernia and is going to have that repaired next week.  He comes in for follow-up.  PVR: 7 cc   UDS 08/31/23:   Pre-Study Uroflow Summary   Patient voided 34cc.   Peak urinary flow rate was: 5.5cc/sec  Voiding time was 16.0 sec Residual urine volume: 10cc   Storage Study The first urge to void occurred at 158cc, the normal urge to void occurred at 181cc, a strong urge to void occurred at 209cc, and the cystometric capacity was 320cc.  There were no uninhibited detrusor contractions. Pt. expressed a strong urge to void at 245cc capacity and was unable to produce voided volume.  Filling was resumed.  Stress test done at 150cc, and 245 cc. There was no leak during valsalva or cough.    Pressure Flow Study Pressure flow study was attempted.   Pt voided 62.0cc with procedural catheters in place.   Maximum detrusor pressure was 44.7cm.  Detrusor pressure at maximum flow was 24.4cm.  The maximum flow rate was 1.4cc/sec.       EMG Activity EMG Activity was captured.   Post Procedure Uroflow  Patient voided 256cc.   Peak urinary flow rate was: 10.6cc/sec.   PVR= 0cc   Physician Interpretation: Capacity: Normal Compliance: Normal Bladder sensation: Normal SUI present?: No Detrusor activity during storage: Normal (stable) Detrusor activity during voiding: Underactive Flow pattern was : Markedly decreased EMG activity was Normal   Impression and plan:  Diagnosis: Stable hypotonic bladder without stress incontinence.  No instability is noted.  He had a markedly decreased flow and was likely Valsalva assisted.  Tumor marker pattern over time has been the following: No results found for: CA199, CEA, CHROMGRNA, AFPTM, CA125, CA2729, CA153, HCG, PSA   Medical History[1]  Allergies[2]  Current Medications[3]  Family History[4]  Social History[5]  Patient Active Problem List   Diagnosis Date Noted   . Postural urinary incontinence 09/16/2023  . Pelvic floor dysfunction 09/16/2023  . Stiffness of both hip joints 09/16/2023  . Urinary incontinence without sensory awareness 09/16/2023  . Mixed incontinence urge and stress (male)(male) 07/28/2023    Resolved Problems  No resolved problems to display.    ROS:A comprehensive review of systems was negative.  EXAM:  Vitals:   01/14/24 0903  BP: (!) 174/62  Pulse:   SpO2:    GENERAL: Vitals reviewed and listed below, alert, oriented, appears well hydrated and in no acute distress HEENT: WNL Neck: Norm ROM LUNGS: Clear to auscultation bilaterally, no wheezes, rales or rhonchi, good air movement CV: HRRR, no peripheral edema ABDOMEN: Soft, non-tender without masses or organomegaly FLANK: GEN RIGHT/LEFT/BIL:  does not CVA tenderness noted to palpation EXTREMITIES: Warm and well perfused Neuro:  Intact Skin:  No significant lesions noted PSYCH: Pleasant and cooperative, no obvious depression or anxiety RECTAL: Rectal vault no lesions GU: Prostate non-palpable.  Penis and testicles normal with a reducible left inguinal hernia.  Results for orders placed or  performed in visit on 01/14/24  POC Urinalysis Auto without Microscopic   Collection Time: 01/14/24  9:18 AM  Result Value Ref Range   Color, Urine Yellow Yellow   Clarity, Urine Clear Clear   Glucose, Urine Negative Negative mg/dL   Bilirubin, Urine Negative Negative   Ketones, Urine Negative Negative mg/dL   Specific Gravity, Urine 1.020 1.010, 1.015, 1.020, 1.025   Blood, Urine Negative Negative   pH, Urine 5.5 5.0, 5.5, 6.0, 6.5, 7.0, 7.5, 8.0   Protein, Urine Trace (A) Negative mg/dL   Urobilinogen, Urine 0.2 <2.0 mg/dL   Nitrite, Urine Negative Negative   Leukocyte Esterase, Urine Negative Negative   Kit/Device Lot # [id number]    Kit/Device Expiration Date 10/2024     Assessment:  Problem List[6]  Plan:  Diagnoses and all orders for this visit:  Pelvic floor dysfunction -     Urodynamic Testing - PVR -     POC Urinalysis Auto without Microscopic  Mixed incontinence urge and stress (male)(male) -     Urodynamic Testing - PVR -     POC Urinalysis Auto without Microscopic  Prostate cancer    (CMD) -     PSA, Total    Risk Assessment:  Overall Glenn [name] is doing well.  Will obtain a PSA and get the results to him.  If this is okay he will see us  on a yearly basis as needed.  He is doing pelvic floor physical therapy and is helped a little bit.  He does still have some urinary incontinence but is not terribly severe and I think a sphincter or sling could potentially throw him in urinary retention with a hypotonic bladder.  He is well-informed.             Return in about 1 year (around 01/13/2025).    Electronically signed by: Tanda LITTIE Nicholaus DOUGLAS, MD 01/14/2024 9:36 AM        [1] Past Medical History: Diagnosis Date  . High cholesterol   . Hypertension   . Prostate cancer    (CMD)   [2] No Known Allergies [3]  Current Outpatient Medications:  .  amLODIPine  (NORVASC ) 5 mg tablet, Take 5 mg by mouth daily. (Patient not taking: Reported on 01/14/2024), Disp: , Rfl:  .   apixaban  (Eliquis ) 5 mg tab, Eliquis  5 mg tablet, Disp: , Rfl:  .  atorvastatin  (LIPITOR) 10 mg tablet, atorvastatin  10 mg tablet, Disp: , Rfl:  .  hydroCHLOROthiazide  (HYDRODIURIL ) 25 mg tablet, , Disp: , Rfl:  .  lisinopriL  (PRINIVIL ) 20 mg tablet, lisinopril  20 mg tablet, Disp: , Rfl:  [4] Family History Problem Relation Name Age of Onset  . Glaucoma Neg Hx    . Macular degeneration Neg Hx    . Retinal detachment Neg Hx    [5] Social History Socioeconomic History  . Marital status: Married  Tobacco Use  . Smoking status: Never  . Smokeless tobacco: Never  Substance and Sexual Activity  . Alcohol  use: Yes   Social Drivers of Health   Food Insecurity: Low Risk  (01/14/2024)   Food vital sign   . Within the past 12 months, you worried that  your food would run out before you got money to buy more: Never true   . Within the past 12 months, the food you bought just didn't last and you didn't have money to get more: Never true  Transportation Needs: No Transportation Needs (01/14/2024)   Transportation   . In the past 12 months, has lack of reliable transportation kept you from medical appointments, meetings, work or from getting things needed for daily living? : No  Safety: Low Risk  (01/14/2024)   Safety   . How often does anyone, including family and friends, physically hurt you?: Never   . How often does anyone, including family and friends, insult or talk down to you?: Never   . How often does anyone, including family and friends, threaten you with harm?: Never   . How often does anyone, including family and friends, scream or curse at you?: Never  Living Situation: Low Risk  (01/14/2024)   Living Situation   . What is your living situation today?: I have a steady place to live   . Think about the place you live. Do you have problems with any of the following? Choose all that apply:: None/None on this list  [6] Patient Active Problem List Diagnosis  . Mixed incontinence urge  and stress (male)(male)  . Postural urinary incontinence  . Pelvic floor dysfunction  . Stiffness of both hip joints  . Urinary incontinence without sensory awareness

## 2024-01-13 ENCOUNTER — Telehealth: Payer: Self-pay | Admitting: Neurology

## 2024-01-13 ENCOUNTER — Encounter: Payer: Self-pay | Admitting: Neurology

## 2024-01-13 ENCOUNTER — Ambulatory Visit: Admitting: Neurology

## 2024-01-13 VITALS — BP 118/76 | Ht 71.0 in | Wt 195.0 lb

## 2024-01-13 DIAGNOSIS — G629 Polyneuropathy, unspecified: Secondary | ICD-10-CM | POA: Diagnosis not present

## 2024-01-13 DIAGNOSIS — I4811 Longstanding persistent atrial fibrillation: Secondary | ICD-10-CM | POA: Diagnosis not present

## 2024-01-13 DIAGNOSIS — R269 Unspecified abnormalities of gait and mobility: Secondary | ICD-10-CM | POA: Diagnosis not present

## 2024-01-13 DIAGNOSIS — Z7189 Other specified counseling: Secondary | ICD-10-CM | POA: Diagnosis not present

## 2024-01-13 DIAGNOSIS — R2689 Other abnormalities of gait and mobility: Secondary | ICD-10-CM | POA: Diagnosis not present

## 2024-01-13 DIAGNOSIS — G4730 Sleep apnea, unspecified: Secondary | ICD-10-CM

## 2024-01-13 DIAGNOSIS — G4739 Other sleep apnea: Secondary | ICD-10-CM

## 2024-01-13 DIAGNOSIS — G8929 Other chronic pain: Secondary | ICD-10-CM | POA: Diagnosis not present

## 2024-01-13 HISTORY — DX: Sleep apnea, unspecified: G47.30
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
## 2024-01-13 NOTE — Telephone Encounter (Signed)
 He still does not have his CPAP machine yet, need help on that

## 2024-01-13 NOTE — Procedures (Signed)
 Full Name: Glenn [name] Gender: Male MRN #: [id number] Date of Birth: April 25, 1940    Visit Date: 01/13/2024 10:08 Age: 83 Years Examining Physician: Onita Duos Referring Physician: Dohmeier Height: 5 feet 11 inch History: 83 year old male with slow Worsening lower extremity paresthesia, unsteady gait  Summary of the test: Nerve conduction study: Bilateral sural, superficial peroneal sensory responses were absent.  Bilateral peroneal to EDB, left tibial motor responses were within normal limit.  Right tibial motor response showed moderate to severe decreased CMAP amplitude  Right median, ulnar sensory responses were within normal limits  Bilateral ulnar motor responses were normal. Left median motor response showed mildly prolonged distal latency with well-preserved CMAP amplitude.  Right median motor response showed upper limit of distal latency, with mildly decreased CMAP amplitude  Electromyography: Selected needle examinations were performed at bilateral lower extremity muscles, lumbosacral paraspinal muscles; right upper extremity muscles and right cervical paraspinal muscles.  There was evidence of mild chronic neuropathic changes involving bilateral L4-5 myotomes.  There was no evidence of active process.  There was no evidence of active denervation at bilateral lumbosacral paraspinal muscles.  Needle examination of bilateral abductor pollicis brevis showed increased insertional activity with mild decreased recruitment patterns   Conclusion: This is an abnormal study.  There is electrodiagnostic evidence of length-dependent mild to moderate axonal sensorimotor polyneuropathy.  In addition, there is evidence of mild bilateral L4-5 chronic lumbosacral radiculopathy, there is no evidence of active process.  There is also evidence of bilateral distal median neuropathy across the wrist, consistent with mild to moderate bilateral carpal tunnel  syndromes.    ------------------------------- Duos Onita. M.D. Ph.D.   [facility] [address] [place], [place] [address] Tel: [phone] Fax: [phone]  Verbal informed consent was obtained from the patient, patient was informed of potential risk of procedure, including bruising, bleeding, hematoma formation, infection, muscle weakness, muscle pain, numbness, among others.        MNC    Nerve / Sites Muscle Latency Ref. Amplitude Ref. Rel Amp Segments Distance Velocity Ref. Area    ms ms mV mV %  cm m/s m/s mVms  R Median - APB     Wrist APB 4.4 <=4.4 3.5 >=4.0 100 Wrist - APB 7   11.7     Upper arm APB 9.2  2.7  75 Upper arm - Wrist 24 50 >=49 11.6  L Median - APB     Wrist APB 4.6 <=4.4 4.9 >=4.0 100 Wrist - APB 7   18.1     Upper arm APB 9.0  4.6  93.3 Upper arm - Wrist 23 52 >=49 18.5  R Ulnar - ADM     Wrist ADM 3.3 <=3.3 9.4 >=6.0 100 Wrist - ADM 7   29.5     B.Elbow ADM 6.5  9.6  103 B.Elbow - Wrist 16 51 >=49 32.9     A.Elbow ADM 9.0  8.4  87.7 A.Elbow - B.Elbow 14 56 >=49 30.7  L Ulnar - ADM     Wrist ADM 3.2 <=3.3 9.0 >=6.0 100 Wrist - ADM 7   34.3     B.Elbow ADM 5.8  8.8  97.8 B.Elbow - Wrist 13 51 >=49 34.1     A.Elbow ADM 8.1  8.5  96.2 A.Elbow - B.Elbow 12 53 >=49 33.1  L Peroneal - EDB     Ankle EDB 5.9 <=6.5 4.2 >=2.0 100 Ankle - EDB 9   18.5  Fib head EDB 13.9  4.0  94.4 Fib head - Ankle 33 41 >=44 17.7     Pop fossa EDB 16.9  3.7  92.6 Pop fossa - Fib head 13 43 >=44 16.9         Pop fossa - Ankle      R Peroneal - EDB     Ankle EDB 5.6 <=6.5 3.1 >=2.0 100 Ankle - EDB 9   11.7     Fib head EDB 13.5  2.6  84.2 Fib head - Ankle 33 42 >=44 10.5     Pop fossa EDB 15.5  2.8  107 Pop fossa - Fib head 9 44 >=44 13.7         Pop fossa - Ankle      L Tibial - AH     Ankle AH 5.7 <=5.8 5.6 >=4.0 100 Ankle - AH 9   17.7     Pop fossa AH 17.7  4.0  70.3 Pop fossa - Ankle 51 43 >=41 16.3  R Tibial - AH     Ankle AH 5.2 <=5.8  1.8 >=4.0 100 Ankle - AH 9   5.5     Pop fossa AH 16.3  1.2  67.2 Pop fossa - Ankle 42 38 >=41 5.6                     SNC    Nerve / Sites Rec. Site Peak Lat Ref.  Amp Ref. Segments Distance    ms ms V V  cm  R Radial - Anatomical snuff box (Forearm)     Forearm Wrist 2.5 <=2.9 15 >=15 Forearm - Wrist 10  L Sural - Ankle (Calf)     Calf Ankle NR <=4.4 NR >=6 Calf - Ankle 14  R Sural - Ankle (Calf)     Calf Ankle NR <=4.4 NR >=6 Calf - Ankle 14  L Superficial peroneal - Ankle     Lat leg Ankle NR <=4.4 NR >=6 Lat leg - Ankle 14  R Superficial peroneal - Ankle     Lat leg Ankle NR <=4.4 NR >=6 Lat leg - Ankle 14  R Median - Orthodromic (Dig II, Mid palm)     Dig II Wrist 3.3 <=3.4 10 >=10 Dig II - Wrist 13  R Ulnar - Orthodromic, (Dig V, Mid palm)     Dig V Wrist 3.4 <=3.1 5 >=5 Dig V - Wrist 65                   F  Wave    Nerve F Lat Ref.   ms ms  L Tibial - AH 57.0 <=56.0  R Tibial - AH 62.1 <=56.0  R Ulnar - ADM 31.8 <=32.0  L Ulnar - ADM 31.5 <=32.0             EMG Summary Table    Spontaneous MUAP Recruitment  Muscle IA Fib PSW Fasc Other Amp Dur. Poly Pattern  R. Tibialis anterior Normal None None None _______ Normal Normal Normal Reduced  R. Tibialis posterior Normal None None None _______ Normal Normal Normal Reduced  R. Peroneus longus Normal None None None _______ Normal Normal Normal Reduced  R. Gastrocnemius (Medial head) Normal None None None _______ Normal Normal Normal Reduced  R. Vastus lateralis Normal None None None _______ Normal Normal Normal Reduced  L. Vastus lateralis Normal None None None _______ Normal Normal Normal Reduced  L. Tibialis anterior Normal None None  None _______ Normal Normal Normal Reduced  L. Tibialis posterior Normal None None None _______ Normal Normal Normal Reduced  L. Gastrocnemius (Medial head) Normal None None None _______ Normal Normal Normal Reduced  L. Peroneus longus Normal None None None _______ Normal Normal Normal  Reduced  L. Lumbar paraspinals (low) Normal None None None _______ Normal Normal Normal Normal  L. Lumbar paraspinals (mid) Normal None None None _______ Normal Normal Normal Normal  R. Lumbar paraspinals (low) Normal None None None _______ Normal Normal Normal Normal  R. Lumbar paraspinals (mid) Normal None None None _______ Normal Normal Normal Normal  R. First dorsal interosseous Normal None None None _______ Normal Normal Normal Normal  R. Extensor digitorum communis Normal None None None _______ Normal Normal Normal Normal  R. Deltoid Normal None None None _______ Normal Normal Normal Normal  R. Biceps brachii Normal None None None _______ Normal Normal Normal Normal  R. Triceps brachii Normal None None None _______ Normal Normal Normal Normal  R. Cervical paraspinals Normal None None None _______ Normal Normal Normal Normal  R. Abductor pollicis brevis Normal None None None _______ Normal Normal Normal Normal  R. Abductor hallucis Increased 1+ None None _______ Normal Normal Normal Reduced  L. Abductor hallucis Increased 1+ None None _______ Normal Normal Normal Reduced

## 2024-01-13 NOTE — Telephone Encounter (Signed)
 no auth required sent to [facility] [phone]

## 2024-01-13 NOTE — Telephone Encounter (Signed)
 Spoke w/Pt regarding DME company trying to reach him to schedule delivery for his BiPAP. Pt stated he has not received any messages. Gave Pt ph # for DME provider and asked him to call. Pt voiced understanding and thanks for the call.  Spoke w/Danielle at [employer] regarding Pt orders. Glenn [name] acknowledged having the order for the BiPAP and that they had been unsuccessful scheduling w/Pt. Verified number they have, they gave home #first. Informed that mobile # they have on file is best to reach Pt but had also asked Pt to reach out to them. Glenn [name] acknowledged and voiced thanks for the call.

## 2024-01-13 NOTE — Telephone Encounter (Signed)
 Received a notification that [facility] has attempted to reach out several times to get him set up for the new PAP machine that was ordered in June. Pt will need to contact [facility]. A letter was also sent in the mail to the patient advising him to reach out to them as well.    Yijun, please have him call [facility] - they seem to have had no luck.   CD

## 2024-01-14 ENCOUNTER — Encounter (HOSPITAL_COMMUNITY): Payer: Self-pay

## 2024-01-14 DIAGNOSIS — N3946 Mixed incontinence: Secondary | ICD-10-CM | POA: Diagnosis not present

## 2024-01-14 DIAGNOSIS — C61 Malignant neoplasm of prostate: Secondary | ICD-10-CM | POA: Diagnosis not present

## 2024-01-14 DIAGNOSIS — M6289 Other specified disorders of muscle: Secondary | ICD-10-CM | POA: Diagnosis not present

## 2024-01-14 NOTE — Progress Notes (Signed)
  Urodynamic Testing - PVR  Date/Time: 01/14/2024 9:00 AM  Performed by: Wilbert CHRISTELLA Pride, LPN Authorized by: Tanda Jama Nicholaus DOUGLAS, MD   Procedure Details    Procedure: bladder scan     PVR Details:     Residual urine (mL): 0     Post-Procedure Details    Outcome: patient tolerated procedure well with no complications

## 2024-01-14 NOTE — Progress Notes (Signed)
 Surgical Instructions    Your procedure is scheduled on Tuesday, 01/19/24.  Report to [facility] Main Entrance A at 5:30 A.M., then check in with the Admitting office.  Call this number if you have problems the morning of surgery:  [phone]   If you have any questions prior to your surgery date call [phone]: Open Monday-Friday 8am-4pm If you experience any cold or flu symptoms such as cough, fever, chills, shortness of breath, etc. between now and your scheduled surgery, please notify us  at the above number     Remember:  Do not eat after midnight the night before your surgery-Monday  You may drink clear liquids until 4:30 AM, the morning of your surgery.   Clear liquids allowed are: Water , Non-Citrus Juices (without pulp), Carbonated Beverages, Clear Tea, Black Coffee ONLY (NO MILK, CREAM OR POWDERED CREAMER of any kind), and Gatorade   Please complete your PRE-SURGERY ENSURE that was provided to you by 4:30 AM, the morning of surgery.  Please, if able, drink it in one setting. DO NOT SIP.    Take these medicines the morning of surgery with A SIP OF WATER :  atorvastatin  (LIPITOR)  Tetrahydrozoline eye drops  IF Needed: acetaminophen  (TYLENOL )   Eliquis  - Stop 3 days prior to procedure per MD.  Last dose will be on 12/15/23     As of today, STOP taking any Aspirin  (unless otherwise instructed by your surgeon) Aleve, Naproxen, Ibuprofen, Motrin, Advil, Goody's, BC's, all herbal medications, fish oil, and all vitamins.           Do not wear jewelry. Do not wear lotions, powders, cologne or deodorant. Men may shave face and neck. Do not bring valuables to the hospital.  [facility] is not responsible for any belongings or valuables.     Contacts, glasses, hearing aids, dentures or partials may not be worn into surgery, please bring cases for these belongings    Patients discharged the day of surgery will not be allowed to drive home, and someone needs to stay with  them for 24 hours.   SURGICAL WAITING ROOM VISITATION Patients having surgery or a procedure may have no more than 2 support people in the waiting area - these visitors may rotate.   Children under the age of 13 must have an adult with them who is not the patient. If the patient needs to stay at the hospital during part of their recovery, the visitor guidelines for inpatient rooms apply. Pre-op nurse will coordinate an appropriate time for 1 support person to accompany patient in pre-op.  This support person may not rotate.   Please refer to [email or web address] for the visitor guidelines for Inpatients (after your surgery is over and you are in a regular room).    Special instructions:    Oral Hygiene is also important to reduce your risk of infection.  Remember - BRUSH YOUR TEETH THE MORNING OF SURGERY WITH YOUR REGULAR TOOTHPASTE   [facility]- Preparing For Surgery  Before surgery, you can play an important role. Because skin is not sterile, your skin needs to be as free of germs as possible. You can reduce the number of germs on your skin by washing with CHG (chlorahexidine gluconate) Soap before surgery.  CHG is an antiseptic cleaner which kills germs and bonds with the skin to continue killing germs even after washing.     Please do not use if you have an allergy to CHG or antibacterial soaps. If your skin becomes reddened/irritated stop  using the CHG.  Do not shave (including legs and underarms) for at least 48 hours prior to first CHG shower. It is OK to shave your face.  Please follow these instructions carefully.     Shower the NIGHT BEFORE SURGERY and the MORNING OF SURGERY with CHG Soap.   If you chose to wash your hair, wash your hair first as usual with your normal shampoo. After you shampoo, rinse your hair and body thoroughly to remove the shampoo.  Then [employer] and genitals (private parts) with your normal soap and  rinse thoroughly to remove soap.  After that Use CHG Soap as you would any other liquid soap. You can apply CHG directly to the skin and wash gently with a scrungie or a clean washcloth.   Apply the CHG Soap to your body ONLY FROM THE NECK DOWN.  Do not use on open wounds or open sores. Avoid contact with your eyes, ears, mouth and genitals (private parts). Wash Face and genitals (private parts)  with your normal soap.   Wash thoroughly, paying special attention to the area where your surgery will be performed.  Thoroughly rinse your body with warm water  from the neck down.  DO NOT shower/wash with your normal soap after using and rinsing off the CHG Soap.  Pat yourself dry with a CLEAN TOWEL.  Wear CLEAN PAJAMAS to bed the night before surgery  Place CLEAN SHEETS on your bed the night before your surgery  DO NOT SLEEP WITH PETS.   Day of Surgery:  Take a shower with CHG soap. Wear Clean/Comfortable clothing the morning of surgery Do not apply any deodorants/lotions.   Remember to brush your teeth WITH YOUR REGULAR TOOTHPASTE.    If you received a COVID test during your pre-op visit, it is requested that you wear a mask when out in public, stay away from anyone that may not be feeling well, and notify your surgeon if you develop symptoms. If you have been in contact with anyone that has tested positive in the last 10 days, please notify your surgeon.    Please read over the following fact sheets that you were given.

## 2024-01-14 NOTE — Progress Notes (Signed)
 PCP - Dr Elsie Gentry Cardiologist - Dr Danelle Birmingham (clearance on 12/18/23 & updated on 12/23/23)  Chest x-Brek - 05/19/23 EKG - 12/16/23 Stress Test - 04/02/16 ECHO - 05/20/22 Cardiac Cath - n/a  ICD Pacemaker/Loop - n/a  Sleep Study -  Yes BiPaP - has been ordered but not delivered to the patient as of 01/13/24.  See TE dated 01/13/24 in EPIC.  Diabetes - n/a  Blood Thinner Instructions:  Hold Eliquis  3 days prior to procedure.  Last dose was on Friday, 12/15/23.  Aspirin  Instructions: n/a  ERAS - clear liquids til 0430 DOS.  PRE-SURGERY Ensure given at PAT with instructions for DOS.     Anesthesia review: Yes, A-Fib and HTN  STOP now taking any Aspirin  (unless otherwise instructed by your surgeon), Aleve, Naproxen, Ibuprofen, Motrin, Advil, Goody's, BC's, all herbal medications, fish oil, and all vitamins.   Coronavirus Screening Do you have any of the following symptoms:  Cough yes/no: No Fever (>100.31F)  yes/no: No Runny nose yes/no: No Sore throat yes/no: No Difficulty breathing/shortness of breath  yes/no: No  Have you traveled in the last 14 days and where? yes/no: No  Patient verbalized understanding of instructions that were given to them at the PAT appointment. Patient was also instructed that they will need to review over the PAT instructions again at home before surgery.

## 2024-01-15 ENCOUNTER — Encounter (HOSPITAL_COMMUNITY): Payer: Self-pay

## 2024-01-15 ENCOUNTER — Encounter (HOSPITAL_COMMUNITY)
Admission: RE | Admit: 2024-01-15 | Discharge: 2024-01-15 | Disposition: A | Discharge status: Home / Self Care | Source: Ambulatory Visit | Attending: General Surgery | Admitting: General Surgery

## 2024-01-15 ENCOUNTER — Other Ambulatory Visit: Payer: Self-pay

## 2024-01-15 VITALS — BP 123/60 | HR 63 | Temp 97.9°F | Resp 17 | Ht 71.0 in | Wt 194.2 lb

## 2024-01-15 DIAGNOSIS — Z7901 Long term (current) use of anticoagulants: Secondary | ICD-10-CM | POA: Insufficient documentation

## 2024-01-15 DIAGNOSIS — Z01812 Encounter for preprocedural laboratory examination: Secondary | ICD-10-CM | POA: Insufficient documentation

## 2024-01-15 DIAGNOSIS — G4733 Obstructive sleep apnea (adult) (pediatric): Secondary | ICD-10-CM | POA: Insufficient documentation

## 2024-01-15 DIAGNOSIS — I4891 Unspecified atrial fibrillation: Secondary | ICD-10-CM | POA: Diagnosis not present

## 2024-01-15 DIAGNOSIS — I10 Essential (primary) hypertension: Secondary | ICD-10-CM | POA: Insufficient documentation

## 2024-01-15 DIAGNOSIS — Z87891 Personal history of nicotine dependence: Secondary | ICD-10-CM | POA: Insufficient documentation

## 2024-01-15 DIAGNOSIS — Z01818 Encounter for other preprocedural examination: Secondary | ICD-10-CM

## 2024-01-15 HISTORY — DX: Unspecified hearing loss, unspecified ear: H91.90

## 2024-01-15 LAB — BASIC METABOLIC PANEL WITH GFR
Anion gap: 11 (ref 5–15)
BUN: 26 mg/dL — ABNORMAL HIGH (ref 8–23)
CO2: 26 mmol/L (ref 22–32)
Calcium: 8.9 mg/dL (ref 8.9–10.3)
Chloride: 101 mmol/L (ref 98–111)
Creatinine, Ser: 1.15 mg/dL (ref 0.61–1.24)
GFR, Estimated: >60 mL/min (ref >60)
Glucose, Bld: 94 mg/dL (ref 70–99)
Potassium: 4.1 mmol/L (ref 3.5–5.1)
Sodium: 138 mmol/L (ref 135–145)

## 2024-01-15 LAB — CBC
HCT: 46.1 % (ref 39.0–52.0)
Hemoglobin: 15.2 g/dL (ref 13.0–17.0)
MCH: 31.7 pg (ref 26.0–34.0)
MCHC: 33 g/dL (ref 30.0–36.0)
MCV: 96 fL (ref 80.0–100.0)
Platelets: 223 K/uL (ref 150–400)
RBC: 4.8 MIL/uL (ref 4.22–5.81)
RDW: 14.3 % (ref 11.5–15.5)
WBC: 8 K/uL (ref 4.0–10.5)
nRBC: 0 % (ref 0.0–0.2)

## 2024-01-16 ENCOUNTER — Ambulatory Visit: Payer: Self-pay | Admitting: Neurology

## 2024-01-16 LAB — SYPHILIS: RPR W/REFLEX TO RPR TITER AND TREPONEMAL ANTIBODIES, TRADITIONAL SCREENING AND DIAGNOSIS ALGORITHM: RPR Ser Ql: NONREACTIVE

## 2024-01-16 LAB — CK: Total CK: 124 U/L (ref 30–208)

## 2024-01-16 LAB — MULTIPLE MYELOMA PANEL, SERUM
Albumin SerPl Elph-Mcnc: 3.9 g/dL (ref 2.9–4.4)
Albumin/Glob SerPl: 1.4 (ref 0.7–1.7)
Alpha 1: 0.2 g/dL (ref 0.0–0.4)
Alpha2 Glob SerPl Elph-Mcnc: 0.6 g/dL (ref 0.4–1.0)
B-Globulin SerPl Elph-Mcnc: 0.8 g/dL (ref 0.7–1.3)
Gamma Glob SerPl Elph-Mcnc: 1.3 g/dL (ref 0.4–1.8)
Globulin, Total: 2.9 g/dL (ref 2.2–3.9)
IgG (Immunoglobin G), Serum: 1337 mg/dL (ref 603–1613)
IgM (Immunoglobulin M), Srm: 76 mg/dL (ref 15–143)
Immunoglobulin A, (IgA) QN, Serum: 85 mg/dL (ref 61–437)
Total Protein: 6.8 g/dL (ref 6.0–8.5)

## 2024-01-16 LAB — ANA W/REFLEX IF POSITIVE: Anti Nuclear Antibody (ANA): NEGATIVE

## 2024-01-16 LAB — C-REACTIVE PROTEIN: CRP: 6 mg/L (ref 0–10)

## 2024-01-16 LAB — TSH: TSH: 0.823 u[IU]/mL (ref 0.450–4.500)

## 2024-01-16 LAB — VITAMIN B12: Vitamin B-12: 588 pg/mL (ref 232–1245)

## 2024-01-16 LAB — SEDIMENTATION RATE: Sed Rate: 8 mm/h (ref 0–30)

## 2024-01-16 LAB — HGB A1C W/O EAG: Hgb A1c MFr Bld: 6.2 % — ABNORMAL HIGH (ref 4.8–5.6)

## 2024-01-18 NOTE — Progress Notes (Signed)
 Anesthesia Chart Review:  83 yo male follows with cardiology for hx of HTN, afib on eliquis . Seen by Dr. Waddell 12/16/23 for routine followup and preop eval. Per note, 1. Atrial fib - his VR is well controlled and actually a little slow. He may eventually need a PPM. No change in his meds. 2. Coags - he will continue eliquis . He has not had any bleeding. 3. HTN - his bp is fairly well controlled at home though a little high in the office today. 4. Preop - he is pending hernia surgery. He is an acceptable surgical risk. He may stop his eliquis  3 days prior to surgery. Restart eliquis  when the post op bleeding risk is acceptable.  Recent diagnosis of OSA, has not yet received machine.  Other pertinent history includes former smoker (quit 1987), left brachial plexopathy due to positioning during prostate surgery 1995  Patient reports last dose of Eliquis  12/15/2023.  EKG 12/16/2023: Atrial fibrillation with slow ventricular response.  Rate 48. Incomplete right bundle branch block  TTE 05/20/22: 1. Left ventricular ejection fraction, by estimation, is 60 to 65%. The  left ventricle has normal function. The left ventricle has no regional  wall motion abnormalities. There is mild left ventricular hypertrophy.  Left ventricular diastolic parameters  are indeterminate.   2. Right ventricular systolic function is normal. The right ventricular  size is mildly enlarged. There is moderately elevated pulmonary artery  systolic pressure. The estimated right ventricular systolic pressure is  50.5 mmHg.   3. Left atrial size was severely dilated.   4. Right atrial size was severely dilated.   5. The mitral valve is grossly normal. Trivial mitral valve  regurgitation. No evidence of mitral stenosis.   6. The aortic valve is abnormal. There is moderate calcification of the  aortic valve. Aortic valve regurgitation is trivial. Aortic valve  sclerosis/calcification is present, without any evidence of aortic   stenosis.   7. Aortic dilatation noted. There is mild dilatation of the ascending  aorta, measuring 42 mm.   8. The inferior vena cava is dilated in size with >50% respiratory  variability, suggesting right atrial pressure of 8 mmHg.   Event monitor 05/14/22: Atrial fib with a controlled VR, slow VR and RVR. Nocturnal pauses up to 4.3 seconds are of no clinical consequence as all after midnight and before 6 a.m. Occaisional PVc's.  No VT Noise artifact is present.    Danelle Taylor,MD Patch Wear Time:  3 days and 5 hours (2024-01-12T15:55:05-498 to 2024-01-15T21:24:48-0500)   Atrial Fibrillation occurred continuously (100% burden), ranging from 27-111 bpm (avg of 59 bpm). 161 Pauses occurred, the longest lasting 4.3 secs (14 bpm). Isolated VEs were frequent (5.3%, 13657), VE Couplets were rare (<1.0%, 463), and VE Triplets  were rare (<1.0%, 3). Ventricular Bigeminy was present. MD notification criteria for Slow Atrial Fibrillation and pauses met - report posted prior to notification per account request (BN).  Nuclear stress 04/02/2016: Nuclear stress EF: 62%. Blood pressure demonstrated a normal response to exercise. There was no ST segment deviation noted during stress. Findings consistent with ischemia. This is a low risk study.   Small area of mild apical ischemia EF 62%       Lynwood Geofm RIGGERS [facility] Short Stay [facility]/Anesthesiology Phone [phone] 01/18/2024 9:38 AM

## 2024-01-18 NOTE — Anesthesia Preprocedure Evaluation (Signed)
 Anesthesia Evaluation  Patient identified by MRN, date of birth, ID band Patient awake    Reviewed: Allergy & Precautions, NPO status , Patient's Chart, lab work & pertinent test results, reviewed documented beta blocker date and time   History of Anesthesia Complications Negative for: history of anesthetic complications  Airway Mallampati: I       Dental  (+) Dental Advisory Given, Partial Upper   Pulmonary sleep apnea , neg COPD, neg recent URI, former smoker   breath sounds clear to auscultation       Cardiovascular hypertension, (-) angina (-) Past MI + dysrhythmias Atrial Fibrillation  Rhythm:Irregular Rate:Normal     Neuro/Psych Bilateral hearing loss requiring hearing aids     GI/Hepatic ,neg GERD  ,,  Endo/Other    Renal/GU Renal InsufficiencyRenal disease     Musculoskeletal  (+) Arthritis , Osteoarthritis,    Abdominal   Peds  Hematology   Anesthesia Other Findings   Reproductive/Obstetrics                              Anesthesia Physical Anesthesia Plan  ASA: 2  Anesthesia Plan: General   Post-op Pain Management:    Induction: Intravenous  PONV Risk Score and Plan: 1  Airway Management Planned: LMA and Oral ETT  Additional Equipment:   Intra-op Plan:   Post-operative Plan: Extubation in OR  Informed Consent:      Dental advisory given  Plan Discussed with:   Anesthesia Plan Comments: (PAT note by Lynwood Hope, PA-C:  83 yo male follows with cardiology for hx of HTN, afib on eliquis . Seen by Dr. Waddell 12/16/23 for routine followup and preop eval. Per note, 1. Atrial fib - his VR is well controlled and actually a little slow. He may eventually need a PPM. No change in his meds. 2. Coags - he will continue eliquis . He has not had any bleeding. 3. HTN - his bp is fairly well controlled at home though a little high in the office today. 4. Preop - he is pending  hernia surgery. He is an acceptable surgical risk. He may stop his eliquis  3 days prior to surgery. Restart eliquis  when the post op bleeding risk is acceptable.  Recent diagnosis of OSA, has not yet received machine.  Other pertinent history includes former smoker (quit 1987), left brachial plexopathy due to positioning during prostate surgery 1995  Patient reports last dose of Eliquis  12/15/2023.  EKG 12/16/2023: Atrial fibrillation with slow ventricular response.  Rate 48. Incomplete right bundle branch block  TTE 05/20/22: 1. Left ventricular ejection fraction, by estimation, is 60 to 65%. The  left ventricle has normal function. The left ventricle has no regional  wall motion abnormalities. There is mild left ventricular hypertrophy.  Left ventricular diastolic parameters  are indeterminate.  2. Right ventricular systolic function is normal. The right ventricular  size is mildly enlarged. There is moderately elevated pulmonary artery  systolic pressure. The estimated right ventricular systolic pressure is  50.5 mmHg.  3. Left atrial size was severely dilated.  4. Right atrial size was severely dilated.  5. The mitral valve is grossly normal. Trivial mitral valve  regurgitation. No evidence of mitral stenosis.  6. The aortic valve is abnormal. There is moderate calcification of the  aortic valve. Aortic valve regurgitation is trivial. Aortic valve  sclerosis/calcification is present, without any evidence of aortic  stenosis.  7. Aortic dilatation noted. There is mild dilatation  of the ascending  aorta, measuring 42 mm.  8. The inferior vena cava is dilated in size with >50% respiratory  variability, suggesting right atrial pressure of 8 mmHg.   Event monitor 05/14/22: 1. Atrial fib with a controlled VR, slow VR and RVR. 2. Nocturnal pauses up to 4.3 seconds are of no clinical consequence as all after midnight and before 6 a.m. 3. Occaisional PVc's.  4. No VT 5. Noise  artifact is present.   Danelle Taylor,MD Patch Wear Time:  3 days and 5 hours (2024-01-12T15:55:05-498 to 2024-01-15T21:24:48-0500)  Atrial Fibrillation occurred continuously (100% burden), ranging from 27-111 bpm (avg of 59 bpm). 161 Pauses occurred, the longest lasting 4.3 secs (14 bpm). Isolated VEs were frequent (5.3%, 13657), VE Couplets were rare (<1.0%, 463), and VE Triplets  were rare (<1.0%, 3). Ventricular Bigeminy was present. MD notification criteria for Slow Atrial Fibrillation and pauses met - report posted prior to notification per account request (BN).  Nuclear stress 04/02/2016:  Nuclear stress EF: 62%.  Blood pressure demonstrated a normal response to exercise.  There was no ST segment deviation noted during stress.  Findings consistent with ischemia.  This is a low risk study.   Small area of mild apical ischemia EF 62%   )         Anesthesia Quick Evaluation

## 2024-01-19 ENCOUNTER — Encounter (HOSPITAL_COMMUNITY): Payer: Self-pay | Admitting: General Surgery

## 2024-01-19 ENCOUNTER — Ambulatory Visit (HOSPITAL_COMMUNITY): Payer: Self-pay | Admitting: Anesthesiology

## 2024-01-19 ENCOUNTER — Ambulatory Visit (HOSPITAL_COMMUNITY): Payer: Self-pay | Admitting: Physician Assistant

## 2024-01-19 ENCOUNTER — Ambulatory Visit (HOSPITAL_COMMUNITY)
Admission: RE | Admit: 2024-01-19 | Discharge: 2024-01-19 | Disposition: A | Discharge status: Home / Self Care | Attending: General Surgery | Admitting: General Surgery

## 2024-01-19 ENCOUNTER — Other Ambulatory Visit: Payer: Self-pay

## 2024-01-19 ENCOUNTER — Encounter (HOSPITAL_COMMUNITY)
Admission: RE | Disposition: A | Discharge status: Home / Self Care | Payer: Self-pay | Source: Home / Self Care | Attending: General Surgery

## 2024-01-19 DIAGNOSIS — K4090 Unilateral inguinal hernia, without obstruction or gangrene, not specified as recurrent: Secondary | ICD-10-CM | POA: Diagnosis not present

## 2024-01-19 DIAGNOSIS — N289 Disorder of kidney and ureter, unspecified: Secondary | ICD-10-CM | POA: Diagnosis not present

## 2024-01-19 DIAGNOSIS — Z8249 Family history of ischemic heart disease and other diseases of the circulatory system: Secondary | ICD-10-CM | POA: Diagnosis not present

## 2024-01-19 DIAGNOSIS — Z87891 Personal history of nicotine dependence: Secondary | ICD-10-CM | POA: Diagnosis not present

## 2024-01-19 DIAGNOSIS — M1990 Unspecified osteoarthritis, unspecified site: Secondary | ICD-10-CM | POA: Diagnosis not present

## 2024-01-19 DIAGNOSIS — Z7901 Long term (current) use of anticoagulants: Secondary | ICD-10-CM | POA: Insufficient documentation

## 2024-01-19 DIAGNOSIS — G4730 Sleep apnea, unspecified: Secondary | ICD-10-CM | POA: Insufficient documentation

## 2024-01-19 DIAGNOSIS — I4891 Unspecified atrial fibrillation: Secondary | ICD-10-CM | POA: Insufficient documentation

## 2024-01-19 DIAGNOSIS — D176 Benign lipomatous neoplasm of spermatic cord: Secondary | ICD-10-CM | POA: Insufficient documentation

## 2024-01-19 DIAGNOSIS — I10 Essential (primary) hypertension: Secondary | ICD-10-CM | POA: Diagnosis not present

## 2024-01-19 HISTORY — PX: INGUINAL HERNIA REPAIR: SHX194

## 2024-01-19 HISTORY — PX: INSERTION OF MESH: SHX5868

## 2024-01-19 SURGERY — REPAIR, HERNIA, INGUINAL, ADULT
Anesthesia: General | Site: Inguinal | Laterality: Left

## 2024-01-19 MED ORDER — CHLORHEXIDINE GLUCONATE CLOTH 2 % EX PADS: 6.0000 | MEDICATED_PAD | Freq: Once | CUTANEOUS | Status: DC

## 2024-01-19 MED ORDER — SUGAMMADEX SODIUM 200 MG/2ML IV SOLN
INTRAVENOUS | Status: DC | PRN
  Administered 2024-01-19: 200 mg via INTRAVENOUS
  Administered 2024-01-19: 100 mg via INTRAVENOUS

## 2024-01-19 MED ORDER — ROCURONIUM BROMIDE 10 MG/ML (PF) SYRINGE
PREFILLED_SYRINGE | INTRAVENOUS | Status: DC | PRN
  Administered 2024-01-19: 60 mg via INTRAVENOUS

## 2024-01-19 MED ORDER — LIDOCAINE 2% (20 MG/ML) 5 ML SYRINGE
INTRAMUSCULAR | Status: DC | PRN
  Administered 2024-01-19: 100 mg via INTRAVENOUS

## 2024-01-19 MED ORDER — BUPIVACAINE-EPINEPHRINE (PF) 0.25% -1:200000 IJ SOLN
INTRAMUSCULAR | Status: AC
  Filled 2024-01-19: qty 30

## 2024-01-19 MED ORDER — PROPOFOL 10 MG/ML IV BOLUS
INTRAVENOUS | Status: AC
  Filled 2024-01-19: qty 20

## 2024-01-19 MED ORDER — PROPOFOL 10 MG/ML IV BOLUS
INTRAVENOUS | Status: DC | PRN
  Administered 2024-01-19: 50 mg via INTRAVENOUS
  Administered 2024-01-19: 10 mg via INTRAVENOUS
  Administered 2024-01-19: 20 mg via INTRAVENOUS

## 2024-01-19 MED ORDER — OXYCODONE HCL 5 MG PO TABS: 5.0000 mg | ORAL_TABLET | Freq: Once | ORAL | Status: DC | PRN

## 2024-01-19 MED ORDER — STERILE WATER FOR IRRIGATION IR SOLN
Status: DC | PRN
  Administered 2024-01-19: 1000 mL

## 2024-01-19 MED ORDER — BUPIVACAINE-EPINEPHRINE 0.25% -1:200000 IJ SOLN
INTRAMUSCULAR | Status: DC | PRN
  Administered 2024-01-19: 16 mL

## 2024-01-19 MED ORDER — 0.9 % SODIUM CHLORIDE (POUR BTL) OPTIME
TOPICAL | Status: DC | PRN
  Administered 2024-01-19: 1000 mL

## 2024-01-19 MED ORDER — INSULIN ASPART 100 UNIT/ML IJ SOLN: 0.0000 [IU] | Freq: Three times a day (TID) | INTRAMUSCULAR | Status: DC

## 2024-01-19 MED ORDER — ONDANSETRON HCL 4 MG/2ML IJ SOLN
INTRAMUSCULAR | Status: DC | PRN
  Administered 2024-01-19: 4 mg via INTRAVENOUS

## 2024-01-19 MED ORDER — ACETAMINOPHEN 500 MG PO TABS
1000.0000 mg | ORAL_TABLET | ORAL | Status: DC
  Filled 2024-01-19: qty 2

## 2024-01-19 MED ORDER — FENTANYL CITRATE (PF) 250 MCG/5ML IJ SOLN
INTRAMUSCULAR | Status: DC | PRN
  Administered 2024-01-19 (×2): 50 ug via INTRAVENOUS

## 2024-01-19 MED ORDER — CHLORHEXIDINE GLUCONATE 0.12 % MT SOLN
15.0000 mL | OROMUCOSAL | Status: AC
  Administered 2024-01-19: 15 mL via OROMUCOSAL
  Filled 2024-01-19 (×2): qty 15

## 2024-01-19 MED ORDER — OXYCODONE HCL 5 MG/5ML PO SOLN: 5.0000 mg | Freq: Once | ORAL | Status: DC | PRN

## 2024-01-19 MED ORDER — PROPOFOL 500 MG/50ML IV EMUL
INTRAVENOUS | Status: DC | PRN
  Administered 2024-01-19: 125 ug/kg/min via INTRAVENOUS

## 2024-01-19 MED ORDER — LACTATED RINGERS IV SOLN: INTRAVENOUS | Status: DC | PRN

## 2024-01-19 MED ORDER — ONDANSETRON HCL 4 MG/2ML IJ SOLN: 4.0000 mg | Freq: Once | INTRAMUSCULAR | Status: DC | PRN

## 2024-01-19 MED ORDER — FENTANYL CITRATE (PF) 250 MCG/5ML IJ SOLN
INTRAMUSCULAR | Status: AC
  Filled 2024-01-19: qty 5

## 2024-01-19 MED ORDER — FENTANYL CITRATE (PF) 100 MCG/2ML IJ SOLN: 25.0000 ug | INTRAMUSCULAR | Status: DC | PRN

## 2024-01-19 MED ORDER — ENSURE PRE-SURGERY PO LIQD
296.0000 mL | Freq: Once | ORAL | Status: DC
Start: 2024-01-19 — End: 2024-01-19

## 2024-01-19 MED ORDER — CEFAZOLIN SODIUM-DEXTROSE 2-4 GM/100ML-% IV SOLN
2.0000 g | INTRAVENOUS | Status: AC
Start: 2024-01-19 — End: 2024-01-19
  Administered 2024-01-19: 2 g via INTRAVENOUS
  Filled 2024-01-19: qty 100

## 2024-01-19 MED ORDER — DEXAMETHASONE SODIUM PHOSPHATE 10 MG/ML IJ SOLN
INTRAMUSCULAR | Status: DC | PRN
  Administered 2024-01-19: 10 mg via INTRAVENOUS

## 2024-01-19 MED ORDER — DROPERIDOL 2.5 MG/ML IJ SOLN: 0.6250 mg | Freq: Once | INTRAMUSCULAR | Status: DC | PRN

## 2024-01-19 MED ORDER — ACETAMINOPHEN 10 MG/ML IV SOLN: 1000.0000 mg | Freq: Once | INTRAVENOUS | Status: DC | PRN

## 2024-01-19 MED ORDER — TRAMADOL HCL 50 MG PO TABS: 50.0000 mg | ORAL_TABLET | Freq: Four times a day (QID) | ORAL | 0 refills | Status: AC | PRN

## 2024-01-19 MED ORDER — GLYCOPYRROLATE PF 0.2 MG/ML IJ SOSY
PREFILLED_SYRINGE | INTRAMUSCULAR | Status: DC | PRN
Start: 2024-01-19 — End: 2024-01-19
  Administered 2024-01-19: .1 mg via INTRAVENOUS

## 2024-01-19 MED ORDER — EPHEDRINE SULFATE-NACL 50-0.9 MG/10ML-% IV SOSY
PREFILLED_SYRINGE | INTRAVENOUS | Status: DC | PRN
  Administered 2024-01-19: 2.5 mg via INTRAVENOUS
  Administered 2024-01-19: 5 mg via INTRAVENOUS

## 2024-01-19 SURGICAL SUPPLY — 34 items
BLADE CLIPPER SURG (BLADE) IMPLANT
CANISTER SUCTION 3000ML PPV (SUCTIONS) IMPLANT
CHLORAPREP W/TINT 26 (MISCELLANEOUS) ×1 IMPLANT
COVER SURGICAL LIGHT HANDLE (MISCELLANEOUS) ×1 IMPLANT
DERMABOND ADVANCED .7 DNX12 (GAUZE/BANDAGES/DRESSINGS) ×1 IMPLANT
DRAIN PENROSE .5X12 LATEX STL (DRAIN) IMPLANT
DRAIN PENROSE 0.5X18 (DRAIN) IMPLANT
DRAPE LAPAROSCOPIC ABDOMINAL (DRAPES) ×1 IMPLANT
ELECTRODE REM PT RTRN 9FT ADLT (ELECTROSURGICAL) ×1 IMPLANT
GAUZE 4X4 16PLY ~~LOC~~+RFID DBL (SPONGE) ×1 IMPLANT
GLOVE BIO SURGEON STRL SZ7.5 (GLOVE) ×2 IMPLANT
GLOVE BIOGEL PI IND STRL 8 (GLOVE) ×1 IMPLANT
GOWN STRL REUS W/ TWL LRG LVL3 (GOWN DISPOSABLE) ×1 IMPLANT
GOWN STRL REUS W/ TWL XL LVL3 (GOWN DISPOSABLE) ×1 IMPLANT
KIT BASIN OR (CUSTOM PROCEDURE TRAY) ×1 IMPLANT
KIT TURNOVER KIT B (KITS) ×1 IMPLANT
MESH PARIETEX PROGRIP LEFT (Mesh General) IMPLANT
NDL HYPO 25GX1X1/2 BEV (NEEDLE) ×1 IMPLANT
NEEDLE HYPO 25GX1X1/2 BEV (NEEDLE) ×1 IMPLANT
PACK GENERAL/GYN (CUSTOM PROCEDURE TRAY) ×1 IMPLANT
PAD ARMBOARD POSITIONER FOAM (MISCELLANEOUS) ×2 IMPLANT
SOLN 0.9% NACL 1000 ML (IV SOLUTION) ×1 IMPLANT
SOLN 0.9% NACL POUR BTL 1000ML (IV SOLUTION) ×1 IMPLANT
SPONGE INTESTINAL PEANUT (DISPOSABLE) IMPLANT
SUT MNCRL AB 4-0 PS2 18 (SUTURE) ×1 IMPLANT
SUT PROLENE 2 0 SH DA (SUTURE) ×1 IMPLANT
SUT SILK 0 TIES 10X30 (SUTURE) ×1 IMPLANT
SUT VIC AB 2-0 SH 27X BRD (SUTURE) ×1 IMPLANT
SUT VIC AB 3-0 SH 27XBRD (SUTURE) ×1 IMPLANT
SUT VICRYL AB 2 0 TIES (SUTURE) ×1 IMPLANT
SYR CONTROL 10ML LL (SYRINGE) ×1 IMPLANT
SYRINGE TOOMEY DISP (SYRINGE) ×1 IMPLANT
TOWEL GREEN STERILE FF (TOWEL DISPOSABLE) ×1 IMPLANT
TRAY FOL W/BAG SLVR 16FR STRL (SET/KITS/TRAYS/PACK) IMPLANT

## 2024-01-19 NOTE — Op Note (Signed)
 01/19/2024  8:10 AM  PATIENT:  Glenn [name]  83 y.o. male  PRE-OPERATIVE DIAGNOSIS:  LEFT INGUINAL HERNIA  POST-OPERATIVE DIAGNOSIS:  LEFT DIRECT INGUINAL HERNIA, CORD LIPOMA  PROCEDURE:  Procedure(s) with comments: REPAIR, HERNIA, INGUINAL, OPEN, ADULT (Left) - LEFT INGUINAL HERNIA, MESH INSERTION OF MESH (Left)  SURGEON:  Surgeons and Role:    * Rubin Calamity, MD - Primary  ASSISTANTS: TAYLOR HOLDER, rnfa   ANESTHESIA:   local and general  EBL:  minimal   BLOOD ADMINISTERED:none  DRAINS: none   LOCAL MEDICATIONS USED:  MARCAINE      SPECIMEN:  No Specimen  DISPOSITION OF SPECIMEN:  N/A  COUNTS:  YES  TOURNIQUET:  * No tourniquets in log *  DICTATION: .Dragon Dictation  Details of the procedure: The patient was taken back to the operating room. The patient was placed in supine position with bilateral SCDs in place. The patient was prepped and draped in the usual sterile fashion.  After appropriate anitbiotics were confirmed, a time-out was confirmed and all facts were verified.  Quarter percent Marcaine  was used to infiltrate the area of the incision and an ilioinguinal nerve block was also placed.   A 5 cm incision was made just 1 cm superior to the inguinal ligament. Bovie cautery was used to maintain hemostasis dissection is carried down to the external oblique.  A standard incision was made laterally, and the external oblique was bluntly dissected away from the surrounding tissue with Metzenbaum scissors. The external oblique was elevated in the spermatic cord was bluntly dissected away from the surrounding tissue.  The ilioinguinal nerve was identified and ligated with an 2-0 dyed vicryl.   The spermatic cord and the hernia were then bluntly dissected away from the pubic tubercle and a Penrose was placed around the hernia sac in the spermatic cord. The vas deferens was identified and protected at all portions of the case. Dissection of the cremasterics took place with  Bovie cautery. A cord lipoma was dissected away from the cord.  It was transected. The hernia was in the direct space.  It was dissected and this retracted into the abdomen in the usual fashion.  At this time a LEFT-sided Progrip mesh was then anchored to the pubic tubercle with a 2-0 Prolene.  It was anchored to the shelving edge of the external oblique x 1 and the conjoint tendon cephalad x 1.  The wrap around of the mesh was sutured to the conjoint tendon as well.  The new internal ring did not strangulate the spermatic cord.   The tail was then tucked under the external oblique. At this time the area was irrigated out with sterile saline.    The external oblique was reapproximated using a 2-0 Vicryl in a running fashion. Scarpa's fascia was then reapproximated using a 3-0 Vicryl running fashion. The skin was then reapproximated with 4 Monocryl in a subcuticular fashion. The skin was then dressed with Dermabond.  The patient was taken to the recovery room in stable condition.     PLAN OF CARE: Discharge to home after PACU  PATIENT DISPOSITION:  PACU - hemodynamically stable.   Delay start of Pharmacological VTE agent (>24hrs) due to surgical blood loss or risk of bleeding: not applicable

## 2024-01-19 NOTE — H&P (Signed)
 Chief Complaint: New Consultation       History of Present Illness: GERARDO [name] is a 83 y.o. male who is seen today as an office consultation at the request of Dr. Sammi for evaluation of New Consultation .   History of Present Illness Glenn [name] is an 83 year old male who presents for evaluation of surgical repair of a hernia. He was referred by Dr. Loreli for evaluation of his hernia.   Six months ago, he experienced burning groin pain while playing tennis, which recurred and led to a hernia diagnosis via ultrasound. He notices bilateral bulges, attributing them to weight gain after shoulder replacement surgery two months ago. Recent increased activity during a vacation, such as carrying luggage, exacerbated the pain.   He underwent retropubic prostate surgery in 1995 with a significant abdominal incision. He is on Eliquis  for atrial fibrillation.   The hernia pain worsens with bowel fullness and improves post-bowel movement. It sometimes impedes walking and is more pronounced when sitting than during physical activities like tennis or golf.       Review of Systems: A complete review of systems was obtained from the patient.  I have reviewed this information and discussed as appropriate with the patient.  See HPI as well for other ROS.   Review of Systems  Constitutional:  Negative for fever.  HENT:  Negative for congestion.   Eyes:  Negative for blurred vision.  Respiratory:  Negative for cough, shortness of breath and wheezing.   Cardiovascular:  Negative for chest pain and palpitations.  Gastrointestinal:  Negative for heartburn.  Genitourinary:  Negative for dysuria.  Musculoskeletal:  Negative for myalgias.  Skin:  Negative for rash.  Neurological:  Negative for dizziness and headaches.  Psychiatric/Behavioral:  Negative for depression and suicidal ideas.   All other systems reviewed and are negative.       Medical History: Past Medical History Past Medical  History: DiagnosisDate            History of cancer                      Hypertension                Sleep apnea              Problem List There is no problem list on file for this patient.     Past Surgical History Past Surgical History: ProcedureLateralityDate            JOINT REPLACEMENT                             Allergies No Known Allergies     Medications Ordered Prior to Encounter Current Outpatient Medications on File Prior to Visit MedicationSigDispenseRefill            acetaminophen  (TYLENOL ) 325 mg Cap      Take 325 mg by mouth                                    amLODIPine  (NORVASC ) 5 MG tablet          Take 5 mg by mouth once daily  apixaban  (ELIQUIS ) 5 mg tablet        Take 5 mg by mouth 2 (two) times daily                                 atorvastatin  (LIPITOR) 10 MG tablet  Take 10 mg by mouth once daily                                gabapentin  (NEURONTIN ) 100 MG capsule Take 100 mg by mouth at bedtime                              hydroCHLOROthiazide  (HYDRODIURIL ) 25 MG tablet        Take 25 mg by mouth once daily                                lisinopriL  (ZESTRIL ) 40 MG tablet     Take 40 mg by mouth once daily                                oxyCODONE  (ROXICODONE ) 5 MG immediate release tablet       Take 5 mg by mouth every 4 (four) hours as needed                                   silver  sulfADIAZINE  (SSD) 1 % cream           Apply 1 Application topically                            tetrahydrozoline (VISINE) 0.05 % ophthalmic solution          Apply 1-2 drops to eye                              tiZANidine  (ZANAFLEX ) 4 MG tablet Take 4,000 mg by mouth every 8 (eight) hours as needed for Muscle spasms                          vibegron (GEMTESA) 75 mg Tab      Take by mouth                              No current facility-administered medications on file prior to visit.       Family History Family History ProblemRelationAge  of Onset            High blood pressure (Hypertension)   Mother              Breast cancer  Mother              Myocardial Infarction (Heart attack)   Father          Tobacco Use History Social History     Tobacco Use Smoking StatusFormer Current packs/day:0.00 Types:Cigarettes Quit ijuz:8014 Years since quitting:40.6 Smokeless TobaccoNever       Social History Social History     Socioeconomic  History Marital status:Married Tobacco Use Smoking status:Former                         Current packs/day:      0.00                         Types: Cigarettes                         Quit date:        57                         Years since quitting:   40.6 Smokeless tobacco:Never Vaping Use Vaping status:Never Used Substance and Sexual Activity Alcohol  use:Yes                         Alcohol /week:  2.0 - 6.0 standard drinks of alcohol                          Types: 2 - 6 Standard drinks or equivalent per week Drug ldz:Wzczm     Social Drivers of Health     Food Insecurity: Low Risk  (07/30/2023)             Received from [facility]             Hunger Vital Sign                        Within the past 12 months, you worried that your food would run out before you got money to buy more: Never true                        Within the past 12 months, the food you bought just didn't last and you didn't have money to get more. : Never true Transportation Needs: No Transportation Needs (07/30/2023)             Received from [employer]                        In the past 12 months, has lack of reliable transportation kept you from medical appointments, meetings, work or from getting things needed for daily living? : No Housing Stability: Unknown (12/18/2023)             Housing Stability Vital Sign                        Homeless in the Last Year: No       Objective:     BP (!) 160/76   Pulse (!) 55   Temp 97.9 F (36.6 C) (Oral)   Resp 20    Ht 5' 11 (1.803 m)   Wt 86.2 kg   SpO2 94%   BMI 26.50 kg/m     Body mass index is 27.84 kg/m. Physical Exam Constitutional:      Appearance: Normal appearance.  HENT:     Head: Normocephalic and atraumatic.     Nose: Nose normal. No congestion.     Mouth/Throat:     Mouth: Mucous membranes are moist.     Pharynx: Oropharynx is clear.  Eyes:     Pupils: Pupils are equal, round, and reactive to light.  Cardiovascular:     Rate and Rhythm: Normal rate and regular rhythm.     Pulses: Normal pulses.     Heart sounds: Normal heart sounds. No murmur heard.    No friction rub. No gallop.  Pulmonary:     Effort: Pulmonary effort is normal. No respiratory distress.     Breath sounds: Normal breath sounds. No stridor. No wheezing, rhonchi or rales.  Abdominal:     General: Abdomen is flat.     Hernia: A hernia is present. Hernia is present in the left inguinal area. There is no hernia in the right inguinal area.  Musculoskeletal:        General: Normal range of motion.     Cervical back: Normal range of motion.  Skin:    General: Skin is warm and dry.  Neurological:     General: No focal deficit present.     Mental Status: He is alert and oriented to person, place, and time.  Psychiatric:        Mood and Affect: Mood normal.        Thought Content: Thought content normal.          Assessment and Plan: Diagnoses and all orders for this visit:   Unilateral inguinal hernia without obstruction or gangrene, recurrence not specified     Nochum L Gassner is a 83 y.o. male  Will need cleareance to be off eliquis  from Dr. Waddell   1.          We will proceed to the OR for a OPEN LEFT inguinal hernia repair with mesh. 2.         All risks and benefits were discussed with the patient, to generally include infection, bleeding, damage to surrounding structures, acute and chronic nerve pain, and recurrence. Alternatives were offered and described.  All questions were answered and the  patient voiced understanding of the procedure and wishes to proceed at this point.             No follow-ups on file.   Lynda Leos, MD, [facility], [place] [facility]

## 2024-01-19 NOTE — Discharge Instructions (Signed)

## 2024-01-19 NOTE — Anesthesia Procedure Notes (Signed)
 Procedure Name: Intubation Date/Time: 01/19/2024 7:25 AM  Performed by: Lamar Lucie DASEN, CRNAPre-anesthesia Checklist: Patient identified, Emergency Drugs available, Suction available and Patient being monitored Patient Re-evaluated:Patient Re-evaluated prior to induction Oxygen Delivery Method: Circle system utilized Preoxygenation: Pre-oxygenation with 100% oxygen Induction Type: IV induction Ventilation: Mask ventilation without difficulty Laryngoscope Size: Mac and 4 Grade View: Grade I Tube type: Oral Tube size: 7.5 mm Number of attempts: 1 Airway Equipment and Method: Stylet and Oral airway Placement Confirmation: ETT inserted through vocal cords under direct vision, positive ETCO2 and breath sounds checked- equal and bilateral Secured at: 22 cm Tube secured with: Tape Dental Injury: Teeth and Oropharynx as per pre-operative assessment

## 2024-01-19 NOTE — Anesthesia Postprocedure Evaluation (Signed)
 Anesthesia Post Note  Patient: Glenn [name]  Procedure(s) Performed: REPAIR, HERNIA, INGUINAL, OPEN, ADULT (Left: Inguinal) INSERTION OF MESH (Left: Inguinal)     Patient location during evaluation: PACU Anesthesia Type: General Level of consciousness: awake and patient cooperative Pain management: pain level controlled Vital Signs Assessment: post-procedure vital signs reviewed and stable Respiratory status: spontaneous breathing Cardiovascular status: blood pressure returned to baseline Postop Assessment: no apparent nausea or vomiting Anesthetic complications: no   No notable events documented.  Last Vitals:  Vitals:   01/19/24 0909 01/19/24 0911  BP:  (!) 147/79  Pulse: (!) 44 63  Resp: 18 18  Temp:  36.4 C  SpO2: 96% 92%    Last Pain:  Vitals:   01/19/24 0911  TempSrc:   PainSc: 0-No pain                 Lauraine KATHEE Birmingham

## 2024-01-19 NOTE — Transfer of Care (Signed)
 Immediate Anesthesia Transfer of Care Note  Patient: Glenn [name]  Procedure(s) Performed: REPAIR, HERNIA, INGUINAL, OPEN, ADULT (Left: Inguinal) INSERTION OF MESH (Left: Inguinal)  Patient Location: PACU  Anesthesia Type:General  Level of Consciousness: drowsy and patient cooperative  Airway & Oxygen Therapy: Patient Spontanous Breathing and Patient connected to nasal cannula oxygen  Post-op Assessment: Report given to RN, Post -op Vital signs reviewed and stable, and Patient moving all extremities X 4  Post vital signs: Reviewed and stable  Last Vitals:  Vitals Value Taken Time  BP 156/95 01/19/24 08:27  Temp    Pulse 76 01/19/24 08:30  Resp 20 01/19/24 08:30  SpO2 97 % 01/19/24 08:30  Vitals shown include unfiled device data.  Last Pain:  Vitals:   01/19/24 0618  TempSrc:   PainSc: 0-No pain         Complications: No notable events documented.

## 2024-01-20 ENCOUNTER — Encounter (HOSPITAL_COMMUNITY): Payer: Self-pay | Admitting: General Surgery

## 2024-01-21 ENCOUNTER — Ambulatory Visit: Payer: Self-pay | Admitting: Neurology

## 2024-02-04 ENCOUNTER — Telehealth: Payer: Self-pay | Admitting: *Deleted

## 2024-02-04 DIAGNOSIS — G4739 Other sleep apnea: Secondary | ICD-10-CM

## 2024-02-04 DIAGNOSIS — I272 Pulmonary hypertension, unspecified: Secondary | ICD-10-CM

## 2024-02-04 DIAGNOSIS — T8189XA Other complications of procedures, not elsewhere classified, initial encounter: Secondary | ICD-10-CM

## 2024-02-04 DIAGNOSIS — I4811 Longstanding persistent atrial fibrillation: Secondary | ICD-10-CM

## 2024-02-04 DIAGNOSIS — J309 Allergic rhinitis, unspecified: Secondary | ICD-10-CM

## 2024-02-04 NOTE — Telephone Encounter (Signed)
 RE: auto bipap machine replacement?? Received: Today Zott, Glade Salt, Nena RAMAN, RN; Wayne, Tammy; Zott, [place]; Darrel Boyer [facility], He did not meet his initial compliance on the BIPAP so the insurance would not give us  auth to continue the rental and we gave him the option to private pay for the equipment or return it.  He chose to return it and we did discuss with him what he would need to do to get set back up.  Due to non compliance his insurance requires he has an in lab titration and he would need to fail cpap and start on BIPAP in the titration in order to qualify him     Previous Messages    ----- Message ----- From: Salt Nena RAMAN, RN Sent: 02/04/2024   2:49 PM EDT To: Madelin Donnice Boyer Darrel; Stacy Zott Subject: auto bipap machine replacement??              Good afternoon,  I called pt for another issue, but he asked about bipap machine,  he had flu had to turn machine back in.  Pay $500??    Do you know what is issue is?    Glenn [name] Male, 83 y.o., 1940/08/01 MRN: [id number] Phone: [phone] Glenn [name])  [place]

## 2024-02-04 NOTE — Addendum Note (Signed)
 Addended by: CHALICE SAUNAS on: 02/04/2024 06:28 PM   Modules accepted: Orders

## 2024-02-04 NOTE — Telephone Encounter (Signed)
 RE: auto bipap machine replacement?? Received: Today [name], Glade Glenn Nena GORMAN, RN Yes, because he did not meet compliance on the BIPAP and returned the equipment insurance will require a new titration       Previous Messages    ----- Message ----- From: Glenn Nena GORMAN, RN Sent: 02/04/2024   3:33 PM EDT To: Glade [name] Subject: FW: auto bipap machine replacement??          If he failed cpap and bipap titration study done 01-27-2023, he would have to repeat?  SY ----- Message ----- From: [name], Glenn Sent: 02/04/2024   3:22 PM EDT To: Glenn [name]; Glenn [name]; San* Subject: RE: auto bipap machine replacement??          Hi Nena, He did not meet his initial compliance on the BIPAP so the insurance would not give us  auth to continue the rental and we gave him the option to private pay for the equipment or return it.  He chose to return it and we did discuss with him what he would need to do to get set back up.  Due to non compliance his insurance requires he has an in lab titration and he would need to fail cpap and start on BIPAP in the titration in order to qualify him ----- Message ----- From: Glenn Nena GORMAN, RN Sent: 02/04/2024   2:49 PM EDT To: Glenn [name]; Glenn [name] Subject: auto bipap machine replacement??              Good afternoon,  I called pt for another issue, but he asked about bipap machine,  he had flu had to turn machine back in.  Pay $500??    Do you know what is issue is?    Glenn [name] Male, 83 y.o., December 19, 1940 MRN: [id number] Phone: [phone] Glenn [name])  Glenn [name]

## 2024-02-04 NOTE — Telephone Encounter (Signed)
-----   Message from Scarsdale Dohmeier sent at 01/21/2024  2:17 PM EDT ----- Mild -Moderate degree of peripheral neuropathy, explaining the abnormal sensation in feet and unsteady gait. You also have a mild and chronic  L4-5 Lumbar nerve root impingement ,this may  affect the ability lift your foot.  It also leads to numbness or pain .   PS : You have mild carpal tunnel syndrome.      ----- Message ----- From: Onita Duos, MD Sent: 01/21/2024  11:04 AM EDT To: Dedra Gores, MD

## 2024-02-04 NOTE — Telephone Encounter (Signed)
 Reached out to pt and relayed results of Myton/EMG he verbalized understanding.  He appreciated call back.  He stated he has not heard from DME about his BIPAP machine.  He did have flu and was not able to use it, had to turn machine in.  I relayed will send message to [facility], but recommended him to call as well.

## 2024-02-05 ENCOUNTER — Ambulatory Visit: Admitting: Podiatry

## 2024-02-05 DIAGNOSIS — M79674 Pain in right toe(s): Secondary | ICD-10-CM

## 2024-02-05 DIAGNOSIS — B351 Tinea unguium: Secondary | ICD-10-CM | POA: Diagnosis not present

## 2024-02-05 DIAGNOSIS — M79675 Pain in left toe(s): Secondary | ICD-10-CM

## 2024-02-05 NOTE — Progress Notes (Unsigned)
  Subjective:  Patient ID: Glenn [name], male    DOB: 10-22-1941,  MRN: [id number]  Chief Complaint  Patient presents with   Callouses    PT is here due to corn on the greater toe on the right foot.   83 y.o. male returns for the above complaint.  Patient presents with thickened elongated dystrophic mycotic toenails x 10 mild pain on palpation she would like for me debride down she is unable to do it herself denies any other acute complaints  Objective:  There were no vitals filed for this visit. Podiatric Exam: Vascular: dorsalis pedis and posterior tibial pulses are palpable bilateral. Capillary return is immediate. Temperature gradient is WNL. Skin turgor WNL  Sensorium: Normal Semmes Weinstein monofilament test. Normal tactile sensation bilaterally. Nail Exam: Pt has thick disfigured discolored nails with subungual debris noted bilateral entire nail hallux through fifth toenails.  Pain on palpation to the nails. Ulcer Exam: There is no evidence of ulcer or pre-ulcerative changes or infection. Orthopedic Exam: Muscle tone and strength are WNL. No limitations in general ROM. No crepitus or effusions noted.  Skin: No Porokeratosis. No infection or ulcers    Assessment & Plan:   1. Pain due to onychomycosis of toenails of both feet     Patient was evaluated and treated and all questions answered.  Onychomycosis with pain  -Nails palliatively debrided as below. -Educated on self-care  Procedure: Nail Debridement Rationale: pain  Type of Debridement: manual, sharp debridement. Instrumentation: Nail nipper, rotary burr. Number of Nails: 10  Procedures and Treatment: Consent by patient was obtained for treatment procedures. The patient understood the discussion of treatment and procedures well. All questions were answered thoroughly reviewed. Debridement of mycotic and hypertrophic toenails, 1 through 5 bilateral and clearing of subungual debris. No ulceration, no infection  noted.  Return Visit-Office Procedure: Patient instructed to return to the office for a follow up visit 3 months for continued evaluation and treatment.  Nicholes Rough, DPM    No follow-ups on file.

## 2024-02-08 ENCOUNTER — Telehealth: Payer: Self-pay | Admitting: Neurology

## 2024-02-08 NOTE — Telephone Encounter (Signed)
 HTA Bipap pending

## 2024-02-08 NOTE — Telephone Encounter (Signed)
 I called pt and let him know that I did f/u on what is needed for him to get bipap machine.  Per [facility].  They stated that: He did not meet his initial compliance on the BIPAP so the insurance would not give us  auth to continue the rental and we gave him the option to private pay for the equipment or return it. He chose to return it and we did discuss with him what he would need to do to get set back up. Due to non compliance his insurance requires he has an in lab titration and he would need to fail cpap and start on BIPAP in the titration in order to qualify him even with bipap titration done 01-27-2023.  Pt said ok, but will contact insurance as well stating that his shoulder replacement pain, flu he did not feel like he needs to do this.  I relayed that new bipap titration study will be authorized by our sleep lab and then he will get call.  He appreciated follow up.  Electronics engineer

## 2024-02-15 ENCOUNTER — Other Ambulatory Visit: Payer: Self-pay | Admitting: Internal Medicine

## 2024-02-15 DIAGNOSIS — I4821 Permanent atrial fibrillation: Secondary | ICD-10-CM

## 2024-02-15 NOTE — Telephone Encounter (Signed)
 Prescription refill request for Eliquis  received. Indication: AF Last office visit: 12/16/23  KANDICE Birmingham MD Scr: 1.15 on 01/15/24  Epic Age: 83 Weight: 91.3kg  Based on above findings Eliquis  5mg  twice daily is the appropriate dose.  Refill approved.

## 2024-02-16 ENCOUNTER — Other Ambulatory Visit: Payer: Self-pay | Admitting: Physician Assistant

## 2024-02-29 NOTE — Telephone Encounter (Signed)
 BIPAP HTA shara: [id number] (exp. 02/08/24 to 05/08/24)

## 2024-03-03 ENCOUNTER — Other Ambulatory Visit: Payer: Self-pay | Admitting: Physician Assistant

## 2024-03-16 ENCOUNTER — Ambulatory Visit: Admitting: Podiatry

## 2024-03-25 ENCOUNTER — Ambulatory Visit: Admitting: Podiatry

## 2024-03-25 DIAGNOSIS — M79675 Pain in left toe(s): Secondary | ICD-10-CM | POA: Diagnosis not present

## 2024-03-25 DIAGNOSIS — M79674 Pain in right toe(s): Secondary | ICD-10-CM | POA: Diagnosis not present

## 2024-03-25 DIAGNOSIS — G629 Polyneuropathy, unspecified: Secondary | ICD-10-CM | POA: Diagnosis not present

## 2024-03-25 DIAGNOSIS — B351 Tinea unguium: Secondary | ICD-10-CM | POA: Diagnosis not present

## 2024-03-25 NOTE — Progress Notes (Signed)
  Subjective:  Patient ID: Glenn [name], male    DOB: Jun 26, 1940,  MRN: [id number]  Chief Complaint  Patient presents with   Wound Check   83 y.o. male returns for the above complaint.  Patient presents with thickened elongated dystrophic toenails x10.  Patient states painful to the patient diabetic he would like to have the nails debrided down he denies any other acute complaints.  Patient has also has secondary complaint neuropathy would like to discuss treatment options for it.  He has tried seeing a neurologist but has not helped denies any other acute complaints  Objective:   There were no vitals filed for this visit.  Podiatric Exam: Vascular: dorsalis pedis and posterior tibial pulses are palpable bilateral. Capillary return is immediate. Temperature gradient is WNL. Skin turgor WNL  Sensorium: Normal Semmes Weinstein monofilament test. Normal tactile sensation bilaterally. Nail Exam: Pt has thick disfigured discolored nails with subungual debris noted bilateral entire nail hallux through fifth toenails.  Pain on palpation to the nails. Ulcer Exam: There is no evidence of ulcer or pre-ulcerative changes or infection. Orthopedic Exam: Muscle tone and strength are WNL. No limitations in general ROM. No crepitus or effusions noted.  Skin: No Porokeratosis. No infection or ulcers    Assessment & Plan:   1. Neuropathy   2. Pain due to onychomycosis of toenails of both feet      Patient was evaluated and treated and all questions answered.  Neuropathy secondary to low back pain - Given the amount of neuropathy he is experiencing in the setting of low back pain patient would benefit from referral to pain management with Dr. Lazarus for advanced neuropathy care.  I discussed this with him he states understanding like to proceed with the referral - Referral was placed.  Onychomycosis with pain  -Nails palliatively debrided as below. -Educated on self-care  Procedure: Nail  Debridement Rationale: pain  Type of Debridement: manual, sharp debridement. Instrumentation: Nail nipper, rotary burr. Number of Nails: 10  Procedures and Treatment: Consent by patient was obtained for treatment procedures. The patient understood the discussion of treatment and procedures well. All questions were answered thoroughly reviewed. Debridement of mycotic and hypertrophic toenails, 1 through 5 bilateral and clearing of subungual debris. No ulceration, no infection noted.  Return Visit-Office Procedure: Patient instructed to return to the office for a follow up visit 3 months for continued evaluation and treatment.  Franky Blanch, DPM    No follow-ups on file.

## 2024-03-28 NOTE — Telephone Encounter (Signed)
 I spoke with the patient about scheduling his Bipap sleep study. He stated he rather not have to have another sleep study in order to get the bipap .  Patient wants to know does he really need to have another sleep study in order to get the bipap or can he go ahead and get the bipap?

## 2024-03-30 NOTE — Telephone Encounter (Addendum)
 I called the patient and let him know that unfortunately with his particular insurance plan he will still have to undergo a repeat bipap titration in order to qualify for the bipap machine. The patient verbalized understanding and confirmed a sleep study date of 04/24/24 at 9 am.  He does not wish to pay for the machine OOP. Reiterated to patient that it is very important that he use his machine consistently for at least 4 hours at night and that he follow-up with us  between 60 and 90 days after receiving the machine. He verbalized understanding and appreciation for the call.

## 2024-03-30 NOTE — Telephone Encounter (Signed)
 Noted, thank you.  Bipap HTA shara: [id number] (exp. 02/08/24 to 05/08/24)   Patient is scheduled at [facility] for 04/24/2024 at 9 pm.  Mailed packet to the patient with all the information on it for his Sleep study.

## 2024-04-11 ENCOUNTER — Other Ambulatory Visit: Payer: Self-pay | Admitting: Physician Assistant

## 2024-04-24 ENCOUNTER — Encounter

## 2024-04-25 ENCOUNTER — Telehealth: Payer: Self-pay | Admitting: Internal Medicine

## 2024-04-25 DIAGNOSIS — I4821 Permanent atrial fibrillation: Secondary | ICD-10-CM

## 2024-04-25 NOTE — Telephone Encounter (Signed)
 Pt c/o swelling/edema: STAT if pt has developed SOB within 24 hours  If swelling, where is the swelling located? Ankles and feet   How much weight have you gained and in what time span? Not sure, maybe 1-2 pounds   Have you gained 2 pounds in a day or 5 pounds in a week?   Do you have a log of your daily weights (if so, list)? No   Are you currently taking a fluid pill? Yes   Are you currently SOB? Yes   Have you traveled recently in a car or plane for an extended period of time? No   Pt has leg and ankle swelling. He has had some sob when up and moving for about the last month. Please advise.

## 2024-04-25 NOTE — Telephone Encounter (Signed)
 Spoke with the patient who states that over the last month he has noticed an increase in swelling in his feet and ankles. He has been getting more short of breath than usual with minimal exertion such as going up stairs. He is still active and able to play tennis with no issues. He has been using compression hose which seem to help some. He tries to elevate his feet as much as possible when he is in his recliner. He does not think that he has increased his intake of sodium over the last month but states that he possibly has had more during the holidays. He states he has probably gained a couple of pounds over the last month but nothing significant. He reports his blood pressures seem to be okay but maybe a bit higher than usual. He does not have any readings to report to me. He confirms that he is taking hydrochlorothiazide  25 mg once daily. He states that he has had increased swelling in the past and Dr. Waddell has had him increase his diuretic. He is wondering if he should do this again or whether he needs to come in for evaluation. Advised patient that I would send a message to Dr. Almetta who will be taking over his care.

## 2024-04-26 DIAGNOSIS — I5033 Acute on chronic diastolic (congestive) heart failure: Secondary | ICD-10-CM

## 2024-04-26 MED ORDER — FUROSEMIDE 20 MG PO TABS: 20.0000 mg | ORAL_TABLET | Freq: Every day | ORAL | 3 refills | Status: AC

## 2024-04-26 NOTE — Telephone Encounter (Addendum)
 Called pt to inform him of MD advisement.  Patient states that usually the doctor Basilio) would just have him take extra Hydrochlorothiazide .  Pt aware will forward back to MD to see if ok to do that as opposed to Lasix .  (If ok, please advised on how much extra Hydrochlorothiazide ).....  (Once MD responds, will place echo order and send to scheduling to arrange OV after echo, per pt request)

## 2024-04-26 NOTE — Telephone Encounter (Signed)
 Echocardiogram has been ordered

## 2024-04-26 NOTE — Addendum Note (Signed)
 Addended by: MAGDALINE NEEDLE on: 04/26/2024 06:10 PM   Modules accepted: Orders

## 2024-04-26 NOTE — Telephone Encounter (Signed)
 Spoke to patient on the phone and he is having intermittent shortness of breath which she has had before but is worsening.  Discussed different options for management.  No obvious anginal equivalents at this time.  I think this is likely from HFpEF in the setting of permanent AF.  He has been in AF in all ECGs reviewed in our system (since 05/31/10).  Discussed different options in terms of medications and I think he probably will have better benefit with as needed Lasix  as opposed to further up titration of hydrochlorothiazide .  Prescribed Lasix  20 mg daily and sent to pharmacy.  He is agreeable with this plan.  TTE is still ordered.

## 2024-04-28 ENCOUNTER — Ambulatory Visit (HOSPITAL_COMMUNITY)
Admission: RE | Admit: 2024-04-28 | Discharge: 2024-04-28 | Disposition: A | Discharge status: Home / Self Care | Source: Ambulatory Visit | Attending: Student in an Organized Health Care Education/Training Program | Admitting: Student in an Organized Health Care Education/Training Program

## 2024-04-28 ENCOUNTER — Ambulatory Visit: Payer: Self-pay | Admitting: Student in an Organized Health Care Education/Training Program

## 2024-04-28 DIAGNOSIS — I4821 Permanent atrial fibrillation: Secondary | ICD-10-CM | POA: Insufficient documentation

## 2024-04-28 LAB — ECHOCARDIOGRAM COMPLETE
AR max vel: 1.62 cm2
AV Area VTI: 1.87 cm2
AV Area mean vel: 1.75 cm2
AV Mean grad: 10 mmHg
AV Peak grad: 21 mmHg
Ao pk vel: 2.29 m/s
S' Lateral: 3.1 cm

## 2024-05-03 ENCOUNTER — Telehealth: Payer: Self-pay | Admitting: Student in an Organized Health Care Education/Training Program

## 2024-05-03 NOTE — Telephone Encounter (Signed)
 Spoke to patient on the phone about TTE results. Overall normal besides biatrial enlargement which is stable from prior. In permanent AF. LE edema has improved on lasix . Apple watch with HR in the 40s, doesn't seem to be sx from this, has been brady before. Last monitor in 2024 with normal HR augmentation. No changes for now.

## 2024-05-03 NOTE — Telephone Encounter (Signed)
 Patient cancel his 04/24/24 SS. He called to r/s. I called him back to r/s. He didn't pick up I left a voicemail for a call back left my direct number.

## 2024-05-04 NOTE — Telephone Encounter (Signed)
 Patient called to r/s his SS.   He is r/s for 05/22/24 at 8 pm.  His authorization has expired I started a new case for the PA.  Mailed new packet to the patient.

## 2024-05-05 ENCOUNTER — Encounter: Payer: Self-pay | Admitting: Family Medicine

## 2024-05-05 ENCOUNTER — Ambulatory Visit
Admission: RE | Admit: 2024-05-05 | Discharge: 2024-05-05 | Disposition: A | Source: Ambulatory Visit | Attending: Family Medicine | Admitting: Family Medicine

## 2024-05-05 ENCOUNTER — Ambulatory Visit: Admitting: Family Medicine

## 2024-05-05 VITALS — BP 138/86 | Ht 70.0 in | Wt 190.0 lb

## 2024-05-05 DIAGNOSIS — M25552 Pain in left hip: Secondary | ICD-10-CM | POA: Diagnosis not present

## 2024-05-05 DIAGNOSIS — G8929 Other chronic pain: Secondary | ICD-10-CM

## 2024-05-05 DIAGNOSIS — Z7901 Long term (current) use of anticoagulants: Secondary | ICD-10-CM | POA: Insufficient documentation

## 2024-05-05 NOTE — Addendum Note (Signed)
 Addended by: TERESSA RAINELL BROCKS on: 05/05/2024 01:30 PM   Modules accepted: Level of Service

## 2024-05-05 NOTE — Progress Notes (Signed)
 "  PCP: Loreli Elsie JONETTA Mickey., MD  Patient is a 84 y.o. male here for L hip pain.  HPI - Going on for about 5 years or more - Endorses a sharp pain over the L hip that lasts for about a day or so and then would return occasionally - Recently has become more constant sharp L hip pain - Recent hernia repair 4 months ago. Thinks he may have been favoring one side during that time that could have triggered it - Back is hurting a little bit, but relates it to gait changes - Aggravated by walking, running, tennis, golf - Relieves by sitting, Tylenol  occasionally - Hasn't tried topical agents  Past Medical History:  Diagnosis Date   Atrial fibrillation (HCC)    takes aspirin  for this   Brachial plexus injury 1995   Left arm   Complication of anesthesia    L arm brachial was stretched and Left arm was paralyzed for 4 months   Dysrhythmia 2009   A-fib   Hearing loss    wears hearing aids   HTN (hypertension)    6 years   Injury of brachial plexus    Prostate cancer (HCC) 1999   Sleep apnea 01/13/2024   BiPaP ordered but patient has not received machine   Torn rotator cuff 2000   Right arm   Urinary incontinence 12/2023    Medications Ordered Prior to Encounter[1]  Past Surgical History:  Procedure Laterality Date   COLONOSCOPY  10/08/2022   ETT  2006   High fitness but bp of 255   INGUINAL HERNIA REPAIR Left 01/19/2024   Procedure: REPAIR, HERNIA, INGUINAL, OPEN, ADULT;  Surgeon: Rubin Calamity, MD;  Location: [facility] OR;  Service: General;  Laterality: Left;  LEFT INGUINAL HERNIA, MESH   INSERTION OF MESH Left 01/19/2024   Procedure: INSERTION OF MESH;  Surgeon: Rubin Calamity, MD;  Location[facility] OR;  Service: General;  Laterality: Left;   Int fixation left forearm  01/19/1989   KNEE SURGERY     Arthroplasty Left   MOHS SURGERY     PROSTATECTOMY     1995   removal of basal cell     REVERSE SHOULDER ARTHROPLASTY Left 07/09/2023   Procedure: ARTHROPLASTY, SHOULDER, TOTAL,  REVERSE;  Surgeon: Dozier Soulier, MD;  Location: [facility] ORS;  Service: Orthopedics;  Laterality: Left;   TONSILLECTOMY     TOTAL KNEE ARTHROPLASTY Right 08/09/2019   Procedure: TOTAL KNEE ARTHROPLASTY;  Surgeon: Ernie Cough, MD;  Location: [facility] ORS;  Service: Orthopedics;  Laterality: Right;  70 mins    Allergies[2]  BP 138/86   Ht 5' 10 (1.778 m)   Wt 190 lb (86.2 kg)   BMI 27.26 kg/m      02/07/2020   11:28 AM  [facility] Adult Exercise  Frequency of aerobic exercise (# of days/week) 6  Average time in minutes 35  Frequency of strengthening activities (# of days/week) 0        No data to display              Objective:  Physical Exam:  Hip Exam Gen: NAD, comfortable in exam room Inspection: No visible lesions, edema, erythema Palpation: TTP over lateral glute and over anterior hip joint.  No tenderness along Lspine in the midline or paraspinal muscles. ROM: Mild discomfort on L lumbar paraspinal musculature with forward bend test, otherwise full ROM without pain.  Tight hamstring bilaterally  Special Tests: Positive Ober's test on L compared to  R. Negative Log roll, (+)FABER, (+)FADIR Strength: 5-/5 strength b/l No leg length difference Walking without a limp NVI distally  Assessment and Plan:   Assessment & Plan Chronic left hip pain LT hip pain for 5 years, recently exacerbated after undergoing left inguinal hernia repair.  Has never had imaging of his left hip previously.  Differential includes hip arthritis vs greater troch bursitis vs IT band issue.  Patient is chronically on anticoagulation and is unable to take anti-inflammatories. - L hip x-Zael and lumbar spine 2-3 views ordered - Advised conservative measures at this time while awaiting further imaging with Voltaren gel, Tylenol , and stretches as able - Discussed the possibility of cortisone injections vs formal PT vs home exercises after reviewing imaging  Kathrine Melena, DO [facility]     [1]  Current Outpatient Medications on File Prior to Visit  Medication Sig Dispense Refill   acetaminophen  (TYLENOL ) 325 MG tablet Take 325 mg by mouth every 6 (six) hours as needed for moderate pain (pain score 4-6).     amoxicillin (AMOXIL) 500 MG capsule Take 2,000 mg by mouth See admin instructions. Take 4 capsules (2000 mg) by mouth 1 hour prior to dental procedures.     atorvastatin  (LIPITOR) 10 MG tablet TAKE 1 TABLET BY MOUTH DAILY 90 tablet 3   ELIQUIS  5 MG TABS tablet TAKE 1 TABLET BY MOUTH 2 TIMES A DAY 180 tablet 1   furosemide  (LASIX ) 20 MG tablet Take 1 tablet (20 mg total) by mouth daily. 90 tablet 3   gabapentin  (NEURONTIN ) 100 MG capsule Take 1 capsule (100 mg total) by mouth at bedtime. (Patient not taking: Reported on 01/14/2024) 30 capsule 5   hydrochlorothiazide  (HYDRODIURIL ) 25 MG tablet TAKE 1 TABLET BY MOUTH DAILY 90 tablet 2   lisinopril  (ZESTRIL ) 40 MG tablet TAKE 1 TABLET BY MOUTH DAILY 90 tablet 2   oxyCODONE  (ROXICODONE ) 5 MG immediate release tablet Take 1 tablet (5 mg total) by mouth every 4 (four) hours as needed. (Patient not taking: No sig reported) 30 tablet 0   silver  sulfADIAZINE  (SILVADENE ) 1 % cream Apply 1 Application topically daily. (Patient not taking: No sig reported) 50 g 0   tetrahydrozoline 0.05 % ophthalmic solution Place 1-2 drops into both eyes in the morning.     tiZANidine  (ZANAFLEX ) 4 MG tablet Take 1 tablet (4 mg total) by mouth every 8 (eight) hours as needed for muscle spasms. (Patient not taking: No sig reported) 30 tablet 0   traMADol  (ULTRAM ) 50 MG tablet Take 1 tablet (50 mg total) by mouth every 6 (six) hours as needed. 20 tablet 0   No current facility-administered medications on file prior to visit.  [2] No Known Allergies  "

## 2024-05-05 NOTE — Telephone Encounter (Signed)
 Bipap HTA shara: [id number] (exp. 05/09/24 to 08/07/24)

## 2024-05-11 ENCOUNTER — Ambulatory Visit: Payer: Self-pay | Admitting: Family Medicine

## 2024-05-11 ENCOUNTER — Ambulatory Visit: Admitting: Student in an Organized Health Care Education/Training Program

## 2024-05-11 NOTE — Progress Notes (Signed)
 X-rays of lumbar spine and left hip/pelvis from 05/05/2024 reviewed. -He has moderate to severe degenerative disc disease throughout his lumbar spine - He has severe left hip osteoarthritis that has significantly progressed since 2021  Hip arthritis is likely contributing to his symptoms.  Definitive treatment would be a hip replacement.  We could try an ultrasound-guided cortisone injection to help with his symptoms.  He would also be a candidate for physical therapy.  Will have staff reach out with results and treatment options. -------------------------------------  Hello Megan- Can you please call patient?  Please let him know he has severe arthritis in his left hip that has worsened over the last several years.  He also has some low back arthritis, but I suspect his hip arthritis is contributing to his symptoms.  If he is interested we can do a trial of an ultrasound-guided cortisone injection into his hip.  He is welcome to schedule this at his convenience.  Otherwise I would also recommend physical therapy.  We can place a referral for him if he is interested.  Is okay for him to try physical therapy first if he desires. Thanks, [employer]

## 2024-05-12 ENCOUNTER — Other Ambulatory Visit: Payer: Self-pay

## 2024-05-12 ENCOUNTER — Ambulatory Visit: Admitting: Family Medicine

## 2024-05-12 ENCOUNTER — Encounter: Payer: Self-pay | Admitting: Family Medicine

## 2024-05-12 VITALS — BP 138/82 | Ht 70.0 in | Wt 190.0 lb

## 2024-05-12 DIAGNOSIS — M25552 Pain in left hip: Secondary | ICD-10-CM | POA: Diagnosis not present

## 2024-05-12 DIAGNOSIS — M1612 Unilateral primary osteoarthritis, left hip: Secondary | ICD-10-CM

## 2024-05-12 DIAGNOSIS — Z7901 Long term (current) use of anticoagulants: Secondary | ICD-10-CM

## 2024-05-12 DIAGNOSIS — G8929 Other chronic pain: Secondary | ICD-10-CM

## 2024-05-12 MED ORDER — METHYLPREDNISOLONE ACETATE 40 MG/ML IJ SUSP
40.0000 mg | Freq: Once | INTRAMUSCULAR | Status: AC
  Administered 2024-05-12: 40 mg via INTRA_ARTICULAR

## 2024-05-12 NOTE — Patient Instructions (Signed)

## 2024-05-12 NOTE — Progress Notes (Signed)
 DATE OF VISIT: 05/12/2024        Glenn [name] DOB: 09/12/1940 MRN: [id number]  CC:  Lt hip u/s guided cortisone injection  History of present Illness: Glenn [name] is a 84 y.o. [name] who presents for a follow-up visit for left hip injection Recent xrays showing severe hip OA On chronic anticoagulation with eliquis .  Medications:  Outpatient Encounter Medications as of 05/12/2024  Medication Sig   acetaminophen  (TYLENOL ) 325 MG tablet Take 325 mg by mouth every 6 (six) hours as needed for moderate pain (pain score 4-6).   amoxicillin (AMOXIL) 500 MG capsule Take 2,000 mg by mouth See admin instructions. Take 4 capsules (2000 mg) by mouth 1 hour prior to dental procedures.   atorvastatin  (LIPITOR) 10 MG tablet TAKE 1 TABLET BY MOUTH DAILY   ELIQUIS  5 MG TABS tablet TAKE 1 TABLET BY MOUTH 2 TIMES A DAY   furosemide  (LASIX ) 20 MG tablet Take 1 tablet (20 mg total) by mouth daily.   gabapentin  (NEURONTIN ) 100 MG capsule Take 1 capsule (100 mg total) by mouth at bedtime. (Patient not taking: Reported on 01/14/2024)   hydrochlorothiazide  (HYDRODIURIL ) 25 MG tablet TAKE 1 TABLET BY MOUTH DAILY   lisinopril  (ZESTRIL ) 40 MG tablet TAKE 1 TABLET BY MOUTH DAILY   oxyCODONE  (ROXICODONE ) 5 MG immediate release tablet Take 1 tablet (5 mg total) by mouth every 4 (four) hours as needed. (Patient not taking: No sig reported)   silver  sulfADIAZINE  (SILVADENE ) 1 % cream Apply 1 Application topically daily. (Patient not taking: No sig reported)   tetrahydrozoline 0.05 % ophthalmic solution Place 1-2 drops into both eyes in the morning.   tiZANidine  (ZANAFLEX ) 4 MG tablet Take 1 tablet (4 mg total) by mouth every 8 (eight) hours as needed for muscle spasms. (Patient not taking: No sig reported)   traMADol  (ULTRAM ) 50 MG tablet Take 1 tablet (50 mg total) by mouth every 6 (six) hours as needed.   No facility-administered encounter medications on file as of 05/12/2024.    Allergies: has no known allergies.  Physical  Examination: Vitals: There were no vitals taken for this visit. GENERAL:  Glenn [name] is a 84 y.o. [name] appearing their stated age, alert and oriented x 3, in no apparent distress.  SKIN: no rashes or lesions, skin clean, dry, intact MSK: Left hip with decreased range of motion.  Positive logroll N/V/I distally  Radiology: LT HIP XR 05/05/24 showing: IMPRESSION: 1. Severe left hip osteoarthritis, progressed since 2021. 2. Moderate right hip osteoarthritis, also mildly progressed.  LSPINE XR 05/05/24 showing: IMPRESSION: Moderate to severe degenerative joint changes of lumbar spine.  Assessment & Plan  1. Chronic left hip pain 2. Primary osteoarthritis of left hip 3. Chronic anticoagulation Will proceed with ultrasound-guided left hip intra-articular injection today as noted below.  PROCEDURE:  Risks & benefits of u/s guided LT hip injection reviewed.  Consent obtained.  Time-out completed.  Patient prepped and draped in the normal fashion. Musculoskeletal ultrasound used to identify appropriate anatomy - left hip joint well visualized, noted to have small effusion and prominent degenerative changes.  After identifying appropriate anatomy, patient positioned & area cleansed with chlorhexidine .  Ethyl chloride spray used to anesthetize the skin.  Solution of 3 mL 1% lidocaine  injected under ultrasound guidance for local anesthesia.  After ensuring adequate anesthesia a solution of 4 mL 1% lidocaine  with 1 mL Kenalog  40mg /mL injected into the left hip joint using a 22-gauge 3.5-inch spinal needle under ultrasound guidance.  Needle well-visualized in the left hip joint.  Images saved.  Patient tolerated procedure well without any complications.  Area covered with adhesive bandage.  Post-procedure care reviewed.  All questions answered.  Will see how he responds to the injection.  Can consider formal physical therapy to help strengthen and stabilize around the hip.  He also mention he has had some  chronic right shoulder issues and known osteoarthritis.  He may be interested in an injection in the future.  Has seen Dr. Dozier for this in the past, but last was many many months ago.  He will follow-up with us  in approximately 4 weeks to reassess, sooner as needed.  Could consider shoulder injection at that time if needed.  Patient expressed understanding & agreement with above.  Encounter Diagnoses  Name Primary?   Chronic left hip pain Yes   Primary osteoarthritis of left hip    Chronic anticoagulation     No orders of the defined types were placed in this encounter.

## 2024-05-20 NOTE — Telephone Encounter (Signed)
 I called the patient to cancel his SS appt due to the sleep lab closing on 05/22/24 because of the weather.   He did not pick up I left a voicemail and left my direct number.

## 2024-05-22 ENCOUNTER — Encounter

## 2024-05-24 NOTE — Telephone Encounter (Signed)
 I called the patient to r/s his SS.  Bipap HTA shara: [id number] (exp. 05/09/24 to 08/07/24) r/s from 05/22/24 E  He is r/s for 06/15/24 at 9 pm.   Mailed new packet to the patient.

## 2024-05-30 ENCOUNTER — Ambulatory Visit: Admitting: Student in an Organized Health Care Education/Training Program

## 2024-06-15 ENCOUNTER — Encounter

## 2024-06-17 ENCOUNTER — Ambulatory Visit: Admitting: Podiatry
